# Patient Record
Sex: Female | Born: 1980 | Race: White | Hispanic: No | Marital: Single | Smoking: Never smoker
Health system: Southern US, Community
[De-identification: ages and names within clinical notes are randomized; demographics above are authoritative.]

## PROBLEM LIST (undated history)

## (undated) DIAGNOSIS — F319 Bipolar disorder, unspecified: Secondary | ICD-10-CM

## (undated) DIAGNOSIS — F53 Postpartum depression: Secondary | ICD-10-CM

## (undated) DIAGNOSIS — L732 Hidradenitis suppurativa: Secondary | ICD-10-CM

## (undated) DIAGNOSIS — R011 Cardiac murmur, unspecified: Secondary | ICD-10-CM

## (undated) DIAGNOSIS — F419 Anxiety disorder, unspecified: Secondary | ICD-10-CM

## (undated) DIAGNOSIS — R87619 Unspecified abnormal cytological findings in specimens from cervix uteri: Secondary | ICD-10-CM

## (undated) DIAGNOSIS — M1990 Unspecified osteoarthritis, unspecified site: Secondary | ICD-10-CM

## (undated) DIAGNOSIS — G4700 Insomnia, unspecified: Secondary | ICD-10-CM

## (undated) DIAGNOSIS — R51 Headache: Secondary | ICD-10-CM

## (undated) DIAGNOSIS — F32A Depression, unspecified: Secondary | ICD-10-CM

## (undated) DIAGNOSIS — G4730 Sleep apnea, unspecified: Secondary | ICD-10-CM

## (undated) DIAGNOSIS — G5600 Carpal tunnel syndrome, unspecified upper limb: Secondary | ICD-10-CM

## (undated) DIAGNOSIS — O219 Vomiting of pregnancy, unspecified: Secondary | ICD-10-CM

## (undated) DIAGNOSIS — F3132 Bipolar disorder, current episode depressed, moderate: Secondary | ICD-10-CM

## (undated) DIAGNOSIS — F329 Major depressive disorder, single episode, unspecified: Secondary | ICD-10-CM

## (undated) DIAGNOSIS — O99345 Other mental disorders complicating the puerperium: Secondary | ICD-10-CM

## (undated) DIAGNOSIS — D649 Anemia, unspecified: Secondary | ICD-10-CM

## (undated) DIAGNOSIS — J45909 Unspecified asthma, uncomplicated: Secondary | ICD-10-CM

## (undated) DIAGNOSIS — IMO0002 Reserved for concepts with insufficient information to code with codable children: Secondary | ICD-10-CM

## (undated) HISTORY — DX: Sleep apnea, unspecified: G47.30

## (undated) HISTORY — DX: Bipolar disorder, current episode depressed, moderate: F31.32

## (undated) HISTORY — DX: Unspecified osteoarthritis, unspecified site: M19.90

## (undated) HISTORY — DX: Anemia, unspecified: D64.9

## (undated) HISTORY — DX: Hidradenitis suppurativa: L73.2

## (undated) HISTORY — DX: Depression, unspecified: F32.A

## (undated) HISTORY — DX: Unspecified asthma, uncomplicated: J45.909

## (undated) HISTORY — DX: Anxiety disorder, unspecified: F41.9

## (undated) HISTORY — PX: CHOLECYSTECTOMY: SHX55

## (undated) HISTORY — DX: Cardiac murmur, unspecified: R01.1

## (undated) HISTORY — DX: Major depressive disorder, single episode, unspecified: F32.9

## (undated) HISTORY — DX: Vomiting of pregnancy, unspecified: O21.9

## (undated) HISTORY — DX: Bipolar disorder, unspecified: F31.9

## (undated) HISTORY — DX: Headache: R51

---

## 2002-09-24 ENCOUNTER — Emergency Department (HOSPITAL_COMMUNITY): Admission: EM | Admit: 2002-09-24 | Discharge: 2002-09-24 | Payer: Self-pay | Admitting: Emergency Medicine

## 2002-11-26 ENCOUNTER — Ambulatory Visit (HOSPITAL_COMMUNITY): Admission: AD | Admit: 2002-11-26 | Discharge: 2002-11-26 | Payer: Self-pay | Admitting: Obstetrics and Gynecology

## 2002-11-27 ENCOUNTER — Encounter: Payer: Self-pay | Admitting: Emergency Medicine

## 2002-11-27 ENCOUNTER — Inpatient Hospital Stay (HOSPITAL_COMMUNITY): Admission: EM | Admit: 2002-11-27 | Discharge: 2002-11-29 | Payer: Self-pay | Admitting: Emergency Medicine

## 2003-02-19 ENCOUNTER — Inpatient Hospital Stay (HOSPITAL_COMMUNITY): Admission: AD | Admit: 2003-02-19 | Discharge: 2003-02-22 | Payer: Self-pay | Admitting: Obstetrics and Gynecology

## 2003-09-12 ENCOUNTER — Encounter: Payer: Self-pay | Admitting: Orthopaedic Surgery

## 2003-09-19 ENCOUNTER — Ambulatory Visit (HOSPITAL_COMMUNITY): Admission: RE | Admit: 2003-09-19 | Discharge: 2003-09-19 | Payer: Self-pay | Admitting: Unknown Physician Specialty

## 2005-07-07 ENCOUNTER — Emergency Department (HOSPITAL_COMMUNITY): Admission: EM | Admit: 2005-07-07 | Discharge: 2005-07-07 | Payer: Self-pay | Admitting: Emergency Medicine

## 2006-08-09 ENCOUNTER — Ambulatory Visit (HOSPITAL_COMMUNITY): Admission: RE | Admit: 2006-08-09 | Discharge: 2006-08-09 | Payer: Self-pay | Admitting: Obstetrics & Gynecology

## 2006-10-01 ENCOUNTER — Ambulatory Visit (HOSPITAL_COMMUNITY): Admission: RE | Admit: 2006-10-01 | Discharge: 2006-10-01 | Payer: Self-pay | Admitting: Neurology

## 2006-10-27 ENCOUNTER — Ambulatory Visit (HOSPITAL_COMMUNITY): Admission: RE | Admit: 2006-10-27 | Discharge: 2006-10-27 | Payer: Self-pay | Admitting: Chiropractic Medicine

## 2006-12-07 ENCOUNTER — Ambulatory Visit (HOSPITAL_COMMUNITY): Admission: RE | Admit: 2006-12-07 | Discharge: 2006-12-07 | Payer: Self-pay | Admitting: Neurology

## 2007-07-14 HISTORY — PX: BRAIN SURGERY: SHX531

## 2007-08-18 ENCOUNTER — Other Ambulatory Visit: Admission: RE | Admit: 2007-08-18 | Discharge: 2007-08-18 | Payer: Self-pay | Admitting: Obstetrics and Gynecology

## 2008-09-06 ENCOUNTER — Other Ambulatory Visit: Admission: RE | Admit: 2008-09-06 | Discharge: 2008-09-06 | Payer: Self-pay | Admitting: Obstetrics and Gynecology

## 2009-02-14 ENCOUNTER — Ambulatory Visit (HOSPITAL_COMMUNITY): Admission: RE | Admit: 2009-02-14 | Discharge: 2009-02-14 | Payer: Self-pay | Admitting: Pediatrics

## 2009-04-30 ENCOUNTER — Encounter (HOSPITAL_COMMUNITY): Admission: RE | Admit: 2009-04-30 | Discharge: 2009-05-30 | Payer: Self-pay | Admitting: Orthopaedic Surgery

## 2009-07-25 ENCOUNTER — Ambulatory Visit (HOSPITAL_COMMUNITY): Admission: RE | Admit: 2009-07-25 | Discharge: 2009-07-25 | Payer: Self-pay | Admitting: Pediatrics

## 2009-12-27 ENCOUNTER — Ambulatory Visit (HOSPITAL_COMMUNITY): Admission: RE | Admit: 2009-12-27 | Discharge: 2009-12-27 | Payer: Self-pay | Admitting: Orthopaedic Surgery

## 2010-01-22 ENCOUNTER — Other Ambulatory Visit: Admission: RE | Admit: 2010-01-22 | Discharge: 2010-01-22 | Payer: Self-pay | Admitting: Obstetrics & Gynecology

## 2010-03-05 ENCOUNTER — Ambulatory Visit (HOSPITAL_COMMUNITY): Admission: RE | Admit: 2010-03-05 | Discharge: 2010-03-05 | Payer: Self-pay | Admitting: Obstetrics & Gynecology

## 2010-07-09 ENCOUNTER — Inpatient Hospital Stay (HOSPITAL_COMMUNITY)
Admission: AD | Admit: 2010-07-09 | Discharge: 2010-07-09 | Payer: Self-pay | Source: Home / Self Care | Attending: Obstetrics & Gynecology | Admitting: Obstetrics & Gynecology

## 2010-08-01 ENCOUNTER — Inpatient Hospital Stay (HOSPITAL_COMMUNITY)
Admission: AD | Admit: 2010-08-01 | Discharge: 2010-08-03 | Payer: Self-pay | Source: Home / Self Care | Attending: Obstetrics & Gynecology | Admitting: Obstetrics & Gynecology

## 2010-08-03 ENCOUNTER — Encounter: Payer: Self-pay | Admitting: Neurology

## 2010-08-04 LAB — CBC
MCH: 30.7 pg (ref 26.0–34.0)
MCHC: 32.6 g/dL (ref 30.0–36.0)
Platelets: 157 10*3/uL (ref 150–400)

## 2010-09-22 LAB — BASIC METABOLIC PANEL
BUN: 8 mg/dL (ref 6–23)
Creatinine, Ser: 0.49 mg/dL (ref 0.4–1.2)
GFR calc Af Amer: >60 mL/min (ref >60)
GFR calc non Af Amer: >60 mL/min (ref >60)
Glucose, Bld: 87 mg/dL (ref 70–99)
Potassium: 3.9 mEq/L (ref 3.5–5.1)

## 2010-09-22 LAB — RAPID URINE DRUG SCREEN, HOSP PERFORMED
Amphetamines: NOT DETECTED
Opiates: NOT DETECTED

## 2010-09-22 LAB — URINALYSIS, ROUTINE W REFLEX MICROSCOPIC
Protein, ur: NEGATIVE mg/dL
Specific Gravity, Urine: 1.02 (ref 1.005–1.030)
Urobilinogen, UA: 0.2 mg/dL (ref 0.0–1.0)
pH: 7.5 (ref 5.0–8.0)

## 2010-09-22 LAB — URINE MICROSCOPIC-ADD ON

## 2010-09-22 LAB — CBC
HCT: 35.5 % — ABNORMAL LOW (ref 36.0–46.0)
MCV: 95.4 fL (ref 78.0–100.0)
Platelets: 217 10*3/uL (ref 150–400)
RDW: 15.5 % (ref 11.5–15.5)
WBC: 12.7 10*3/uL — ABNORMAL HIGH (ref 4.0–10.5)

## 2010-11-28 NOTE — Op Note (Signed)
   NAME:  EUDELIA, HILTUNEN A                         ACCOUNT NO.:  [id number]   MEDICAL RECORD NO.:  [id number]                   PATIENT TYPE:  INP   LOCATION:  LDR4                                 FACILITY:  [facility]   PHYSICIAN:  Tilda Burrow, M.D.              DATE OF BIRTH:  Apr 22, 1981   DATE OF PROCEDURE:  DATE OF DISCHARGE:                                 OPERATIVE REPORT   LABOR COURSE:  Rheta was noted to be fully dilated and felt an urge to  push at 7 a.m.  After a one-hour and fifteen minute second stage, she  delivered a viable female infant at 34.  The mouth and nose were suctioned  with a bulb syringe on the perineum, and a nuchal cord was reduced without  difficulty.  The rest of the baby delivered without difficulty.  Weight is 6  pounds 15 ounces, Apgars are 8 and 9.  Pitocin, 20 units diluted in 1000 cc  of lactated Ringer's, was then infused rapidly IV.  The placenta separated  spontaneously and delivered via controlled cord traction and maternal  pushing effort at 0820.  It was inspected and appears to be intact with a  three-vessel cord.  The fundus was firm, and minimal blood loss was noted.  The epidural catheter was then removed, with the blue tip visualized as  being intact.  Prior to the removal of the epidural, the vagina was  inspected and only small first-degree lacerations were found, not  necessitating repair.  Estimated blood loss was 250 cc.     Jacklyn Shell, C.N.M.          Tilda Burrow, M.D.    FC/MEDQ  D:  02/20/2003  T:  02/20/2003  Job:  [id number]   cc:   [facility]

## 2010-11-28 NOTE — Discharge Summary (Signed)
NAMEVERLINDA, SLOTNICK                         ACCOUNT NO.:  [id number]   MEDICAL RECORD NO.:  [id number]                   PATIENT TYPE:  OIB   LOCATION:  A415                                 FACILITY:  [facility]   PHYSICIAN:  Tilda Burrow, M.D.              DATE OF BIRTH:  1980-08-11   DATE OF ADMISSION:  11/26/2002  DATE OF DISCHARGE:  11/26/2002                                 DISCHARGE SUMMARY   ADMISSION DIAGNOSIS:  Substernal chest pain since 6 p.m.   HISTORY OF PRESENT ILLNESS:  The patient is a 30 year old female, gravida 1,  para 0, at 27-4/[redacted] weeks gestation presents after complaining of severe  epigastric lower chest pain with associated shortness of breath and  discomfort upon breathing.  She has had similar pain, not quite as severe,  off and on for the past 2-1/2 months, and has been treated with Prevacid and  Nexium without significant improvement.  Tonight was worse, and was  associated with vomiting.  There was no blood or hematemesis.  The patient  presents in an urgent status complaining of chest pain.   PHYSICAL EXAMINATION:  VITAL SIGNS:  Temperature 98, pulse 108, respirations  40 initially, decreasing promptly with calming down, blood pressure was  123/70.  Urinalysis negative.  O2 saturation is now 100% on 2.5 L per nasal  cannula.  GENERAL:  Unlabored breathing, somber, somewhat __________ hippie-appearing  Caucasian female, alert and oriented x3.  Respiratory are regular and  unlabored.  ABDOMEN:  Shows a gravid uterus and obese female.  Pain is described by the  patient as epigastric, slightly on to the right of the midline and  penetrating through to the back on the right side at the level of the  inferior edge of the scapula.  She has a positive Murphy's sign.   GI cocktail was administered with only slight relief.  IV analgesics  administered.   IMPRESSION:  1. Biliary colic, rule out gallstones.  2. Intrauterine pregnancy, stable at present.   PLAN:  The patient is scheduled for a gallbladder ultrasound tomorrow at 2  p.m., and is given a Tylox prescription as well.   The patient is somewhat concerned giving history of amphetamines on her  initial urine screen.  This was performed the first week of her pregnancy  care, and the patient acknowledges using dietary stimulants as close as one  week to her first prenatal visit.  This likely explains the positive  amphetamine from the initial urine drug screen in the office.                                               Tilda Burrow, M.D.    JVF/MEDQ  D:  11/26/2002  T:  11/27/2002  Job:  [id number]

## 2010-11-28 NOTE — H&P (Signed)
   NAME:  Tina Ross, Tina Ross                         ACCOUNT NO.:  [id number]   MEDICAL RECORD NO.:  [id number]                   PATIENT TYPE:  INP   LOCATION:  LDR4                                 FACILITY:  [facility]   PHYSICIAN:  Tilda Burrow, M.D.              DATE OF BIRTH:  10/07/1980   DATE OF ADMISSION:  02/19/2003  DATE OF DISCHARGE:                                HISTORY & PHYSICAL   ADMISSION DIAGNOSIS:  Pregnancy 40 weeks latent phase labor, spontaneous  rupture of membranes with minimal labor.   HISTORY OF PRESENT ILLNESS:  This 30 year old female, gravida 1, para 0, AB  0, LMP May 13, 2002, placing the menstrual ________ February 18, 2003 with  ultrasound [facility] of August 14 and August 9 based on first and second trimester  scans. She was admitted after a pregnancy course followed through our office  since nine weeks gestation with a weight gain of 35 pounds reaching 258  pounds after last prenatal visit. The patient presented to [facility] at approximately just before midnight complaining of gush of fluid  at 8 p.m. There was no bleeding. Contractions were minimally appreciable.  The patient is accompanied by multiple family members, mother, father and  three others. The patient is admitted for labor management.   PAST MEDICAL HISTORY:  Positive for scoliosis.   PAST SURGICAL HISTORY:  Laparoscopic cholecystectomy during pregnancy, exact  date not identifiable on prenatal records.   ALLERGIES:  None.   SOCIAL HISTORY:  Single, lives with mother and the baby's father is  estranged having moved to [place]. She plans circumcision, plans to bottle  feed.   PHYSICAL EXAMINATION:  VITAL SIGNS:  Height 5 feet 4, weight 258.  GENERAL:  Shows a large frame Caucasian female with generous lax tissues in  the back area.  VITAL SIGNS:  Temperature 98.7, pulse 91, respiratory rate 20, blood  pressure 143/90.  Urinalysis negative. Initial exam at 23:30 shows the cervix to  be long,  closed and high, unable to be accessed due to discomfort and technical  difficulties of delivery. Vertex presentation was confirmed in the office  earlier. The patient had progressed to 6 cm dilated by the time of my  confirmatory exam at 6 a.m.   PRENATAL LABORATORIES:  Blood type O+, UDS positive for amphetamines from  diet pills.  Hemoglobin 13, hematocrit 41.  Hepatitis, HIV, GC, Chlamydia,  RPR are all negative.   PLAN:  Expect vaginal delivery. The patient will likely progress promptly.                                              Tilda Burrow, M.D.   JVF/MEDQ  D:  02/20/2003  T:  02/20/2003  Job:  [id number]

## 2010-11-28 NOTE — Op Note (Signed)
   NAME:  Tina Ross, Tina Ross                         ACCOUNT NO.:  [id number]   MEDICAL RECORD NO.:  [id number]                   PATIENT TYPE:  INP   LOCATION:  LDR4                                 FACILITY:  [facility]   PHYSICIAN:  Tilda Burrow, M.D.              DATE OF BIRTH:  [birth year]   DATE OF PROCEDURE:  DATE OF DISCHARGE:                                 OPERATIVE REPORT   PROCEDURE:  Epidural catheter placement.   TIME:  6:45 a.m.   DESCRIPTION OF PROCEDURE:  Continuous lumbar epidural catheter is placed at  the L2-3 interspace without any obvious complications using loss of  resistance technique. After consents obtained, the patient placed in a  sitting position, flexing forward, the back was prepped and draped, and L3-4  interspace anesthetized at the skin surface. The patient's very soft fatty  tissue over the back made for technically challenging identification of the  interspaces. L3-4 interspace effort was attempted but only bony obstruction  could be encountered at a depth of approximately 6 cm. We were unable to  identify access to the epidural space. This site was discontinued, L2-3  interspace attempted and initially were unsuccessfully moved up about a 1/4  of an inch and were able to obtain access through loss of resistance  technique at a depth of about 8 cm to what felt like the interspace.  Aspiration revealed no cerebrospinal fluid. The epidural catheter threaded  easily to a depth of 3 cm, and the catheter needle was withdrawn. The  initial test dose of 5 mL, 1.5% Xylocaine with epinephrine was then  infiltrated and again aspirated with no evidence of blood of excess fluid  aspiration. We then gave a 3 mL bolus (2 mL were used to flush the catheter,  then place at 12 mL/hour). The patient seemed more comfortable but was  obscure in her comments but obviously much more comfortable. Post catheter  exam showed her to be 8 cm dilated. The patient is tolerating  labor much  better at this time with symmetric analgesia.                                               Tilda Burrow, M.D.    JVF/MEDQ  D:  02/20/2003  T:  02/20/2003  Job:  [id number]

## 2010-11-28 NOTE — Discharge Summary (Signed)
   Tina Ross, Tina Ross                         ACCOUNT NO.:  [id number]   MEDICAL RECORD NO.:  [id number]                   PATIENT TYPE:  INP   LOCATION:  A427                                 FACILITY:  [facility]   PHYSICIAN:  Dalia Heading, M.D.               DATE OF BIRTH:  April 07, 1981   DATE OF ADMISSION:  11/27/2002  DATE OF DISCHARGE:  11/29/2002                                 DISCHARGE SUMMARY   AGE:  30 years old.   HOSPITAL COURSE SUMMARY:  Patient is a 30 year old white female who is [redacted]  weeks gestation who presented to the emergency room with right upper  quadrant abdominal pain and nausea.  Ultrasound of the gallbladder revealed  cholelithiasis.  Her liver enzyme tests were noted to be elevated.  She was  admitted to the hospital for intravenous hydration and further pain  management.  It was felt that even though she was at the beginning of her  third trimester, she would not do well with conservative management of her  cholelithiasis.  After extensive discussions with Dr. Emelda Fear and the  family and patient, it was elected to proceed with a laparoscopic  cholecystectomy; this was performed on 11/28/2002.  She tolerated the  procedure well.  Her postoperative course was unremarkable.  No evidence of  uterine contractions was noted.  Good fetal movement and heart tones were  noted postoperatively.   The patient is being discharged home on 11/29/2002 in good, improving  condition.  Of note was the fact that her liver enzyme tests were decreasing  towards normal.  Her hematocrit was stable as compared to preop.   DISCHARGE INSTRUCTIONS:   FOLLOW UP:  Patient is to follow up with Dr. Emelda Fear and Dr. Lovell Sheehan on  12/07/2002.   DISCHARGE MEDICATIONS:  Include Darvocet-N 100 one to two tablets p.o. q.4h.  p.r.n. pain.    PRINCIPAL DIAGNOSES:  1. Cholecystitis, cholelithiasis.  2. Intrauterine pregnancy.   PRINCIPAL PROCEDURE:  Laparoscopic cholecystectomy on  11/28/2002.                                               Dalia Heading, M.D.    MAJ/MEDQ  D:  11/29/2002  T:  11/29/2002  Job:  [id number]   cc:   Tilda Burrow, M.D.  [address] [place]  [place] [address]  Fax: [phone]   Patrica Duel, M.D.  [address]  [place]  [place] [address]  Fax: [phone]

## 2010-11-28 NOTE — Op Note (Signed)
NAME:  Tina Ross, Tina Ross                         ACCOUNT NO.:  [id number]   MEDICAL RECORD NO.:  [id number]                   PATIENT TYPE:  INP   LOCATION:  A427                                 FACILITY:  [facility]   PHYSICIAN:  Dalia Heading, M.D.               DATE OF BIRTH:  10/25/1980   DATE OF PROCEDURE:  11/28/2002  DATE OF DISCHARGE:                                 OPERATIVE REPORT   PREOPERATIVE DIAGNOSES:  Cholecystitis, cholelithiasis, intrauterine  pregnancy.   POSTOPERATIVE DIAGNOSES:  Cholecystitis, cholelithiasis, intrauterine  pregnancy.   PROCEDURE:  Laparoscopic cholecystectomy.   SURGEON:  Dalia Heading, M.D.   ASSISTANT:  Tilda Burrow, M.D.   ANESTHESIA:  General endotracheal.   INDICATIONS FOR PROCEDURE:  The patient is a 30 year old white female who is  [redacted] weeks pregnant who presents with biliary colic secondary to  cholelithiasis. She does have elevated liver enzyme test. Ultrasound of the  gallbladder reveals cholelithiasis. The risks and benefits of the procedure  including bleeding, infection, pain, hepatobiliary injury, the possibility  of the an open procedure, the possibility of premature labor and fetal  demise were fully explained to the patient, who gave informed consent.   DESCRIPTION OF PROCEDURE:  The patient was placed in the supine position.  After induction of the general endotracheal anesthesia, the abdomen was  prepped and draped using the usual sterile technique with Betadine. Surgical  site confirmation was performed. The uterus was palpated just above the  umbilicus. An supraumbilical incision was made down to the fascia and a  Veress needle was carefully introduced into the abdominal cavity without  difficulty. Confirmation placement was done using the saline drop test. The  abdomen was then insufflated to 16 mmHg pressure. An 11 mm trocar was  introduced into the abdominal cavity under direct visualization without  difficulty.  The uterus was inspected and there was no evidence of injury to  the uterus. The patient was placed in deeper reverse Trendelenburg position  and an additional 11 mm trocar was placed in the epigastric region and 5 mm  trocars placed in the right upper quadrant and right flank regions. The  liver was inspected and noted to be within normal limits. The gallbladder  was retracted superior and laterally. The gallbladder was noted to be tense.  Needle decompression was performed. The cystic duct was first identified.  Its juncture to the infundibulum fully identified. The Endoclips were placed  proximally and distally on the cystic duct and the cystic duct was divided.  This was likewise done at the cystic artery. The gallbladder was then freed  away from the gallbladder fossa using Bovie electrocautery. The gallbladder  was delivered through the epigastric trocar site using an EndoCatch bag  without difficulty. The gallbladder fossa was inspected and no abdominal  bleeding or bile leakage was noted. Surgicel was placed in the gallbladder  fossa. The uterus was  again inspected and appeared to be within normal  limits. All fluid and air were then evacuated from the abdominal cavity  prior to removal of the trocars.   All wounds were irrigated with normal saline. All wounds were injected with  0.5% Sensorcaine. The supraumbilical fascia was reapproximated using an #0  Vicryl interrupted suture. All skin incisions were closed using staples.  Betadine ointment and dry sterile dressings were applied.   All tape and needle counts were correct at the end of the procedure. The  patient was extubated in the operating room and went back to the recovery  room awake in stable condition. Fetal heart tones were noted in the recovery  room. There was no evidence of uterine contractions in the recovery room.   COMPLICATIONS:  None.   SPECIMENS:  Gallbladder with stones.   ESTIMATED BLOOD LOSS:   Minimal.                                               Dalia Heading, M.D.    MAJ/MEDQ  D:  11/28/2002  T:  11/28/2002  Job:  [id number]   cc:   Tilda Burrow, M.D.  [address] [place]  [place] [address]  Fax: [phone]   Patrica Duel, M.D.  [address], [address]  [place]  [place] [address]  Fax: [phone]

## 2010-11-28 NOTE — H&P (Signed)
NAME:  Tina Ross, Tina Ross                         ACCOUNT NO.:  [id number]   MEDICAL RECORD NO.:  [id number]                   PATIENT TYPE:  INP   LOCATION:  A427                                 FACILITY:  [facility]   PHYSICIAN:  Dalia Heading, M.D.               DATE OF BIRTH:  1981/03/04   DATE OF ADMISSION:  11/27/2002  DATE OF DISCHARGE:                                HISTORY & PHYSICAL   REASON FOR ADMISSION:  Biliary colic, cholelithiasis, 28-weeks intrauterine  pregnancy.   HISTORY OF PRESENT ILLNESS:  The patient is a 30 year old white female who  is 28-weeks pregnant with a two-month history of treatment for GERD who now  presents with a 24-hour history of worsening right upper quadrant abdominal  pain. She states it hurts to take a deep breath in. It seems to have made  her epigastric pain worse. She was brought into the hospital yesterday  evening for intravenous hydration. She returns this morning with worsening  right upper quadrant abdominal pain. Ultrasound of the gallbladder reveals  cholelithiasis. She states that the morphine has helped her pain, though it  is still difficult to take a deep breath in.   PAST MEDICAL HISTORY:  As noted above.   PAST SURGICAL HISTORY:  Unremarkable.   CURRENT MEDICATIONS:  Phenergan and Pepcid.   ALLERGIES:  No known drug allergies.   REVIEW OF SYSTEMS:  Noncontributory.   PHYSICAL EXAMINATION:  GENERAL:  The patient is a 30 year old white female  who is in no acute distress. She is afebrile. Vital signs are stable.  LUNGS:  Clear to auscultation with equal breath sounds bilaterally.  HEART:  Reveals a regular rate and rhythm without S3, S4, or murmurs.  ABDOMEN:  Soft with a 28-week gestation fundus present. She is tender in the  right upper quadrant to palpation. No hepatosplenomegaly or masses noted.   MET 7 is within normal limits. Liver profile is pending. White blood cell  count is 11.5, hematocrit 32, platelet count 251.  Ultrasound of the  gallbladder reveals cholelithiasis with a normal common bile duct. Fetal  heart rate of 142 is noted.   IMPRESSION:  1. Biliary colic.  2. Cholelithiasis.  3. Twenty-eight weeks intrauterine pregnancy.   PLAN:  Given the patient's significant right upper quadrant abdominal pain  despite having morphine, it is felt by myself and Dr. Emelda Fear that she will  not tolerate 10 weeks of further gestation with cholelithiasis present. It  is recommended that the patient undergo a laparoscopic cholecystectomy. The  risks and benefits of the procedure including bleeding, infection,  hepatobiliary injury, the possibility of open procedure, and the possibility  of causing preterm labor were fully explained to the patient and mother, who  gave informed consent. The patient will be admitted to the hospital for  intravenous hydration and bowel preparation.  Dalia Heading, M.D.    MAJ/MEDQ  D:  11/27/2002  T:  11/27/2002  Job:  [id number]   cc:   Tilda Burrow, M.D.  [address] [place]  [place] [address]  Fax: [phone]

## 2011-01-13 ENCOUNTER — Ambulatory Visit (HOSPITAL_COMMUNITY)
Admission: RE | Admit: 2011-01-13 | Discharge: 2011-01-13 | Disposition: A | Discharge status: Home / Self Care | Payer: Medicaid Other | Source: Ambulatory Visit | Attending: Pediatrics | Admitting: Pediatrics

## 2011-01-13 ENCOUNTER — Other Ambulatory Visit (HOSPITAL_COMMUNITY): Payer: Self-pay | Admitting: Pediatrics

## 2011-01-13 DIAGNOSIS — M545 Low back pain, unspecified: Secondary | ICD-10-CM | POA: Insufficient documentation

## 2012-01-07 ENCOUNTER — Ambulatory Visit (INDEPENDENT_AMBULATORY_CARE_PROVIDER_SITE_OTHER): Payer: Medicaid Other | Admitting: Otolaryngology

## 2012-07-08 ENCOUNTER — Emergency Department (HOSPITAL_COMMUNITY)
Admission: EM | Admit: 2012-07-08 | Discharge: 2012-07-08 | Disposition: A | Discharge status: Home / Self Care | Payer: Medicaid Other | Attending: Emergency Medicine | Admitting: Emergency Medicine

## 2012-07-08 ENCOUNTER — Emergency Department (HOSPITAL_COMMUNITY): Payer: Medicaid Other

## 2012-07-08 ENCOUNTER — Encounter (HOSPITAL_COMMUNITY): Payer: Self-pay | Admitting: *Deleted

## 2012-07-08 DIAGNOSIS — X58XXXA Exposure to other specified factors, initial encounter: Secondary | ICD-10-CM | POA: Insufficient documentation

## 2012-07-08 DIAGNOSIS — Z8659 Personal history of other mental and behavioral disorders: Secondary | ICD-10-CM | POA: Insufficient documentation

## 2012-07-08 DIAGNOSIS — S63601A Unspecified sprain of right thumb, initial encounter: Secondary | ICD-10-CM

## 2012-07-08 DIAGNOSIS — S6390XA Sprain of unspecified part of unspecified wrist and hand, initial encounter: Secondary | ICD-10-CM | POA: Insufficient documentation

## 2012-07-08 DIAGNOSIS — Z8739 Personal history of other diseases of the musculoskeletal system and connective tissue: Secondary | ICD-10-CM | POA: Insufficient documentation

## 2012-07-08 DIAGNOSIS — Y9372 Activity, wrestling: Secondary | ICD-10-CM | POA: Insufficient documentation

## 2012-07-08 DIAGNOSIS — Y9289 Other specified places as the place of occurrence of the external cause: Secondary | ICD-10-CM | POA: Insufficient documentation

## 2012-07-08 HISTORY — DX: Carpal tunnel syndrome, unspecified upper limb: G56.00

## 2012-07-08 HISTORY — DX: Bipolar disorder, unspecified: F31.9

## 2012-07-08 NOTE — ED Notes (Signed)
Pain to right thumb, radiating up wrist and forearm. Pt states "bone grinding on bone" pain to thumb. NAD.

## 2012-07-08 NOTE — ED Provider Notes (Signed)
History     CSN: [id number]  Arrival date & time 07/08/12  1608   First MD Initiated Contact with Patient 07/08/12 1826      Chief Complaint  Patient presents with  . Hand Pain    (Consider location/radiation/quality/duration/timing/severity/associated sxs/prior treatment) HPI Comments: Horse playing with husband and injured R thumb.  R hand dominant.  Patient is a 31 y.o. female presenting with hand pain. The history is provided by the patient. No language interpreter was used.  Hand Pain This is a new problem. Episode onset: 4 days ago. The problem occurs constantly. The problem has been unchanged. Pertinent negatives include no chills or fever. Nothing aggravates the symptoms. She has tried nothing for the symptoms.    Past Medical History  Diagnosis Date  . Bipolar 1 disorder   . Carpal tunnel syndrome     Past Surgical History  Procedure Date  . Cholecystectomy   . Brain surgery     No family history on file.  History  Substance Use Topics  . Smoking status: Never Smoker   . Smokeless tobacco: Not on file  . Alcohol Use: Yes     Comment: Occ    OB History    Grav Para Term Preterm Abortions TAB SAB Ect Mult Living                  Review of Systems  Constitutional: Negative for fever and chills.  Musculoskeletal:       Thumb injury   Skin: Negative for wound.  All other systems reviewed and are negative.    Allergies  Review of patient's allergies indicates no known allergies.  Home Medications   Current Outpatient Rx  Name  Route  Sig  Dispense  Refill  . FERROUS SULFATE 325 (65 FE) MG PO TABS   Oral   Take 325 mg by mouth daily.           BP 145/101  Pulse 94  Temp 98.1 F (36.7 C) (Oral)  Resp 20  Ht 5\' 4"  (1.626 m)  Wt 240 lb (108.863 kg)  BMI 41.20 kg/m2  SpO2 98%  LMP 07/01/2012  Physical Exam  Nursing note and vitals reviewed. Constitutional: She is oriented to person, place, and time. She appears well-developed and  well-nourished. No distress.  HENT:  Head: Normocephalic and atraumatic.  Eyes: EOM are normal.  Neck: Normal range of motion.  Cardiovascular: Normal rate, regular rhythm and normal heart sounds.   Pulmonary/Chest: Effort normal and breath sounds normal.  Abdominal: Soft. She exhibits no distension. There is no tenderness.  Musculoskeletal: She exhibits tenderness.       Right hand: She exhibits decreased range of motion, tenderness and bony tenderness. She exhibits no deformity, no laceration and no swelling. normal sensation noted. Normal strength noted.       Hands: Neurological: She is alert and oriented to person, place, and time.  Skin: Skin is warm and dry.  Psychiatric: She has a normal mood and affect. Judgment normal.    ED Course  Procedures (including critical care time)  Labs Reviewed - No data to display Dg Hand Complete Right  07/08/2012  *RADIOLOGY REPORT*  Clinical Data: Hand pain  RIGHT HAND - COMPLETE 3+ VIEW  Comparison: None.  Findings: Radiocarpal joint is normal.  No carpal or metacarpal fracture.  No   phalangeal injury.  IMPRESSION: No acute osseous abnormality.   Original Report Authenticated By: Genevive Bi, M.D.      1.  Sprain of right thumb       MDM  Ice. Ibuprofen F/u with PCP prn        Tina Field, PA 07/08/12 1913

## 2012-07-08 NOTE — ED Notes (Signed)
Pain rt hand for 4 days, injury when "wrestling " with someone.  No visible injury

## 2012-07-09 NOTE — ED Provider Notes (Signed)
Medical screening examination/treatment/procedure(s) were performed by non-physician practitioner and as supervising physician I was immediately available for consultation/collaboration.  Dartanian Knaggs, MD 07/09/12 0003 

## 2012-07-13 NOTE — L&D Delivery Note (Signed)
Delivery Note At 6:35 PM a viable female was delivered via Vaginal, Spontaneous Delivery (Presentation: Right Occiput Anterior).  APGAR: 8, 9; weight: pending Placenta status: Intact, Spontaneous.  Cord: 3 vessels with the following complications: None.    Anesthesia: None  Episiotomy: None Lacerations: None Suture Repair: n/a Est. Blood Loss (mL): 150  Mother pushed twice to precipitously deliver viable female infant over intact perineum in MAU. Nuchal cord x 1 which was delivered through then reduced after delivery of body. Shoulders and body delivered without difficulty or delay. Baby vigorous and crying upon delivery and placed on maternal abdomen. Cord clamping delayed until umbilical cord stopped pulsing. Cord then clamped and cut by FOB.  Third stage of labor actively managed with gentle cord traction and Oxytocin. Placenta delivered intact with 3 vessel cord. No perineal or vaginal lacerations. Mother and infant stable at conclusion of delivery. Counts correct and verified. Mom to postpartum. Infant to couplet care/skin to skin.   Delivery performed by me with direct supervision by Caren Griffins, CNM  [place], Ryann 05/19/2013, 7:38 PM Evaluation and management procedures were performed by Resident physician under my supervision/collaboration. Chart reviewed, patient examined by me and I agree with management and plan.

## 2012-09-21 ENCOUNTER — Other Ambulatory Visit: Payer: Self-pay | Admitting: Obstetrics & Gynecology

## 2012-09-21 DIAGNOSIS — Z1389 Encounter for screening for other disorder: Secondary | ICD-10-CM

## 2012-09-27 ENCOUNTER — Telehealth: Payer: Self-pay | Admitting: *Deleted

## 2012-09-28 ENCOUNTER — Telehealth: Payer: Self-pay | Admitting: *Deleted

## 2012-09-28 NOTE — Telephone Encounter (Signed)
Pt called with complaining of cold symptoms. I advised her that she could take tylenol and use cough drops. Still too early for anything else. Pt also stated that she fell on the ice 2 days ago and hurt her back. She denied any bleeding or cramping at this time. I spoke with Dr. Emelda Fear about her fall and he said she should be fine. Pt advised of this. The pt also stated that she had stopped taking her bipolar meds and had been off of them for a few months. She stated she felt like she was going to go crazy. I asked the pt if she was having suicidal thoughts or thoughts of harming herself or someone else. She stated no to both of those. I tried to give her an appointment before next Monday and she refused. Stated she wanted to wait until Monday.

## 2012-10-03 ENCOUNTER — Ambulatory Visit (INDEPENDENT_AMBULATORY_CARE_PROVIDER_SITE_OTHER): Payer: Medicaid Other

## 2012-10-03 DIAGNOSIS — O26849 Uterine size-date discrepancy, unspecified trimester: Secondary | ICD-10-CM

## 2012-10-03 DIAGNOSIS — Z1389 Encounter for screening for other disorder: Secondary | ICD-10-CM

## 2012-10-03 NOTE — Progress Notes (Signed)
U/S-transvaginal u/s performed, single IUP with +FCA noted, FHR=131BPM, CRL c/w 6+3 wks, EDC-05-26-2013, cx long and closed, bilateral adnexa wnl

## 2012-10-04 ENCOUNTER — Other Ambulatory Visit (HOSPITAL_COMMUNITY)
Admission: RE | Admit: 2012-10-04 | Discharge: 2012-10-04 | Disposition: A | Discharge status: Home / Self Care | Payer: Medicaid Other | Source: Ambulatory Visit | Attending: Obstetrics and Gynecology | Admitting: Obstetrics and Gynecology

## 2012-10-04 ENCOUNTER — Other Ambulatory Visit: Payer: Self-pay | Admitting: Adult Health

## 2012-10-04 ENCOUNTER — Ambulatory Visit (INDEPENDENT_AMBULATORY_CARE_PROVIDER_SITE_OTHER): Payer: Medicaid Other | Admitting: Adult Health

## 2012-10-04 ENCOUNTER — Encounter: Payer: Self-pay | Admitting: Adult Health

## 2012-10-04 VITALS — BP 126/74 | Wt 274.0 lb

## 2012-10-04 DIAGNOSIS — Z113 Encounter for screening for infections with a predominantly sexual mode of transmission: Secondary | ICD-10-CM | POA: Insufficient documentation

## 2012-10-04 DIAGNOSIS — O09299 Supervision of pregnancy with other poor reproductive or obstetric history, unspecified trimester: Secondary | ICD-10-CM

## 2012-10-04 DIAGNOSIS — Z3480 Encounter for supervision of other normal pregnancy, unspecified trimester: Secondary | ICD-10-CM | POA: Insufficient documentation

## 2012-10-04 DIAGNOSIS — E669 Obesity, unspecified: Secondary | ICD-10-CM

## 2012-10-04 DIAGNOSIS — O210 Mild hyperemesis gravidarum: Secondary | ICD-10-CM

## 2012-10-04 DIAGNOSIS — O219 Vomiting of pregnancy, unspecified: Secondary | ICD-10-CM | POA: Insufficient documentation

## 2012-10-04 DIAGNOSIS — O99210 Obesity complicating pregnancy, unspecified trimester: Secondary | ICD-10-CM

## 2012-10-04 DIAGNOSIS — Z1151 Encounter for screening for human papillomavirus (HPV): Secondary | ICD-10-CM | POA: Insufficient documentation

## 2012-10-04 DIAGNOSIS — Z3490 Encounter for supervision of normal pregnancy, unspecified, unspecified trimester: Secondary | ICD-10-CM

## 2012-10-04 DIAGNOSIS — F3132 Bipolar disorder, current episode depressed, moderate: Secondary | ICD-10-CM

## 2012-10-04 DIAGNOSIS — Z331 Pregnant state, incidental: Secondary | ICD-10-CM

## 2012-10-04 DIAGNOSIS — O99340 Other mental disorders complicating pregnancy, unspecified trimester: Secondary | ICD-10-CM

## 2012-10-04 DIAGNOSIS — Z01419 Encounter for gynecological examination (general) (routine) without abnormal findings: Secondary | ICD-10-CM | POA: Insufficient documentation

## 2012-10-04 DIAGNOSIS — O9989 Other specified diseases and conditions complicating pregnancy, childbirth and the puerperium: Secondary | ICD-10-CM

## 2012-10-04 DIAGNOSIS — L732 Hidradenitis suppurativa: Secondary | ICD-10-CM | POA: Insufficient documentation

## 2012-10-04 DIAGNOSIS — Z1389 Encounter for screening for other disorder: Secondary | ICD-10-CM

## 2012-10-04 DIAGNOSIS — Z3481 Encounter for supervision of other normal pregnancy, first trimester: Secondary | ICD-10-CM

## 2012-10-04 HISTORY — DX: Vomiting of pregnancy, unspecified: O21.9

## 2012-10-04 HISTORY — DX: Bipolar disorder, current episode depressed, moderate: F31.32

## 2012-10-04 HISTORY — DX: Hidradenitis suppurativa: L73.2

## 2012-10-04 LAB — POCT URINALYSIS DIPSTICK
Blood, UA: NEGATIVE
Ketones, UA: NEGATIVE
Protein, UA: NEGATIVE

## 2012-10-04 MED ORDER — PROMETHAZINE HCL 25 MG PO TABS: 25.0000 mg | ORAL_TABLET | Freq: Four times a day (QID) | ORAL | Status: DC | PRN

## 2012-10-04 NOTE — Patient Instructions (Addendum)
Take phenergan 25mg  1 tab. Every 6 hours prn nausea, return in 4 weeks, look at ob packet for otc meds for cold sx. Call any problems. Sign up for my chart

## 2012-10-04 NOTE — Progress Notes (Signed)
Had +FHM on ultrasound yesterday. Had pelvic exam and pap today,External genitalia: Hidradenitis mons pubis and between thighs. Vagina normal in appearance, cervix bulbous with - CMT, uterus felt to be normal size shape and contour slightly difficult exam secondary to abdominal girth.No bleeding No discharge itching or burning, Complains of nausea and vomiting daily.Has occ. Panic attack, no bipolar meds. Since November.

## 2012-10-04 NOTE — Progress Notes (Signed)
Pt given [facility]NC form to fill out and consents signed for labs. Wants to discuss her anxiety attacks and some issues she is having.

## 2012-10-05 LAB — DRUG SCREEN, URINE, NO CONFIRMATION
Amphetamine Screen, Ur: NEGATIVE
Barbiturate Quant, Ur: NEGATIVE
Cocaine Metabolites: NEGATIVE

## 2012-10-05 LAB — RUBELLA SCREEN: Rubella: 0.85 Index (ref <0.90)

## 2012-10-05 LAB — URINALYSIS
Hgb urine dipstick: NEGATIVE
Leukocytes, UA: NEGATIVE
Nitrite: NEGATIVE
Protein, ur: NEGATIVE mg/dL
Urobilinogen, UA: 0.2 mg/dL (ref 0.0–1.0)

## 2012-10-05 LAB — ABO AND RH: Rh Type: POSITIVE

## 2012-10-05 LAB — HEPATITIS B SURFACE ANTIGEN: Hepatitis B Surface Ag: NEGATIVE

## 2012-10-05 LAB — US OB TRANSVAGINAL

## 2012-10-05 LAB — VARICELLA ZOSTER ANTIBODY, IGG: Varicella IgG: 303.5 Index — ABNORMAL HIGH (ref <135.00)

## 2012-10-05 LAB — CBC
MCHC: 33.3 g/dL (ref 30.0–36.0)
Platelets: 183 10*3/uL (ref 150–400)
RDW: 13.8 % (ref 11.5–15.5)

## 2012-10-05 LAB — HIV ANTIBODY (ROUTINE TESTING W REFLEX): HIV: NONREACTIVE

## 2012-10-13 ENCOUNTER — Telehealth: Payer: Self-pay | Admitting: *Deleted

## 2012-10-13 DIAGNOSIS — Z3490 Encounter for supervision of normal pregnancy, unspecified, unspecified trimester: Secondary | ICD-10-CM

## 2012-10-13 DIAGNOSIS — B955 Unspecified streptococcus as the cause of diseases classified elsewhere: Secondary | ICD-10-CM

## 2012-10-13 MED ORDER — AMPICILLIN 250 MG PO CAPS: 250.0000 mg | ORAL_CAPSULE | Freq: Three times a day (TID) | ORAL | Status: DC

## 2012-10-13 MED ORDER — PRENATAL MULTIVITAMIN CH: 1.0000 | ORAL_TABLET | Freq: Every day | ORAL | Status: DC

## 2012-10-13 NOTE — Telephone Encounter (Signed)
Pt. Lost prenatal vitamins, will call Prenatal plus into [facility].She is complaining of some back pain to the left. I informed her of the GBS in her urine and that I was calling ampicillin in for that.I also informed her of an abnormal pap smear and that she needed a colposcopy

## 2012-10-13 NOTE — Telephone Encounter (Signed)
Requesting PNV to [facility], pt stated lost samples that was given to her at last appt.

## 2012-11-02 ENCOUNTER — Encounter: Payer: Self-pay | Admitting: *Deleted

## 2012-11-03 ENCOUNTER — Encounter: Payer: Self-pay | Admitting: Advanced Practice Midwife

## 2012-11-03 ENCOUNTER — Ambulatory Visit (INDEPENDENT_AMBULATORY_CARE_PROVIDER_SITE_OTHER): Payer: Medicaid Other | Admitting: Advanced Practice Midwife

## 2012-11-03 VITALS — BP 118/64 | Wt 267.6 lb

## 2012-11-03 DIAGNOSIS — IMO0002 Reserved for concepts with insufficient information to code with codable children: Secondary | ICD-10-CM

## 2012-11-03 DIAGNOSIS — Z331 Pregnant state, incidental: Secondary | ICD-10-CM

## 2012-11-03 DIAGNOSIS — Z1389 Encounter for screening for other disorder: Secondary | ICD-10-CM

## 2012-11-03 DIAGNOSIS — O99340 Other mental disorders complicating pregnancy, unspecified trimester: Secondary | ICD-10-CM

## 2012-11-03 DIAGNOSIS — O09299 Supervision of pregnancy with other poor reproductive or obstetric history, unspecified trimester: Secondary | ICD-10-CM

## 2012-11-03 DIAGNOSIS — Z3481 Encounter for supervision of other normal pregnancy, first trimester: Secondary | ICD-10-CM

## 2012-11-03 DIAGNOSIS — F3132 Bipolar disorder, current episode depressed, moderate: Secondary | ICD-10-CM

## 2012-11-03 LAB — POCT URINALYSIS DIPSTICK
Blood, UA: NEGATIVE
Ketones, UA: NEGATIVE
Protein, UA: NEGATIVE

## 2012-11-03 NOTE — Progress Notes (Signed)
No c/o at this time.  Routine questions about pregnancy answered.  F/U in 2 weeks for NT/IT.  4 weeks for Colpo

## 2012-11-03 NOTE — Patient Instructions (Addendum)
Abnormal Pap Test Information  During a Pap test, the cells on the surface of your cervix are checked to see if they look normal, abnormal, or if they show signs of having been altered by a certain type of virus called human papillomavirus, or HPV. Cervical cells that have been affected by HPV are called dysplasia. Dysplasia is not cancer, but describes abnormal cells found on the surface of the cervix. Depending on the degree of dysplasia, some of the cells may be considered pre-cancerous and may turn into cancer over time if follow up with a caregiver is delayed.   WHAT DOES AN ABNORMAL PAP TEST MEAN?  Having an abnormal pap test does not mean that you have cancer. However, certain types of abnormal pap tests can be a sign that a person is at a higher risk of developing cancer. Your caregiver will want to do other tests to find out more about the abnormal cells. Your abnormal Pap test results could show:   · Small and uncertain changes that should be carefully watched.    · Cervical dysplasia that has caused mild changes and can be followed over time.    · Cervical dysplasia that is more severe and needs to be followed and treated to ensure the problem goes away.  · Cancer.    When severe cervical dysplasia is found and treated early, it rarely will grow into cancer.   WHAT WILL BE DONE ABOUT MY ABNORMAL PAP TEST?  · A colposcopy may be needed. This is a procedure where your cervix is examined using light and magnification.  · A small tissue sample of your cervix (biopsy) may need to be removed and then examined. This is often performed if there are areas that appear infected.  · A sample of cells from the cervical canal may be removed with either a small brush or scraping instrument (curette).  Based on the results of the procedures above, some caregivers may recommend either cryotherapy of the cervix or a surgical LEEP where a portion of the cervix is removed. LEEP is short for "loop electrical excisional  procedure." Rarely, a caregiver may recommend a cone biopsy. This is a procedure where a small, cone-shaped sample of your cervix is taken out. The part that is taken out is the area where the abnormal cells are.    WHAT IF I HAVE A DYSPLASIA OR A CANCER?  You may be referred to a specialist. Radiation may also be a treatment for more advanced cancer. Having a hysterectomy is the last treatment option for dysplasia, but it is a more common treatment for someone with cancer. All treatment options will be discussed with you by your caregiver.  WHAT SHOULD YOU DO AFTER BEING TREATED?  If you have had an abnormal pap test, you should continue to have regular pap tests and check-ups as directed by your caregiver. Your cervical problem will be carefully watched so it does not get worse. Also, your caregiver can watch for, and treat, any new problems that may come up.  Document Released: 10/14/2010 Document Revised: 09/21/2011 Document Reviewed: 06/25/2011  ExitCare® Patient Information ©2013 [employer].

## 2012-11-03 NOTE — Progress Notes (Signed)
Pain in left side; went to [facility] ER on Tuesday and was told they heard 2 heartbeats.

## 2012-11-03 NOTE — Assessment & Plan Note (Signed)
Not on any meds for Bipolar (was on Geodon 80 mg BID).  Feels OK

## 2012-11-15 ENCOUNTER — Telehealth: Payer: Self-pay | Admitting: *Deleted

## 2012-11-15 DIAGNOSIS — O219 Vomiting of pregnancy, unspecified: Secondary | ICD-10-CM

## 2012-11-15 DIAGNOSIS — L732 Hidradenitis suppurativa: Secondary | ICD-10-CM

## 2012-11-15 DIAGNOSIS — IMO0002 Reserved for concepts with insufficient information to code with codable children: Secondary | ICD-10-CM

## 2012-11-15 DIAGNOSIS — F3132 Bipolar disorder, current episode depressed, moderate: Secondary | ICD-10-CM

## 2012-11-15 NOTE — Telephone Encounter (Signed)
Pt called and stated that she was ready to pull her hair out.  She stated she is very stressed and overwhelmed. She stated that her daughter was living with her mother and would not come home, and she was getting very angry and upset about that. I advised her to keep her appointment on Thursday and discuss it with a provider then. Also discuss if meds are needed. Pt verbalized understanding.

## 2012-11-17 ENCOUNTER — Ambulatory Visit (INDEPENDENT_AMBULATORY_CARE_PROVIDER_SITE_OTHER): Payer: Medicaid Other

## 2012-11-17 ENCOUNTER — Ambulatory Visit (INDEPENDENT_AMBULATORY_CARE_PROVIDER_SITE_OTHER): Payer: Medicaid Other | Admitting: Obstetrics and Gynecology

## 2012-11-17 ENCOUNTER — Other Ambulatory Visit: Payer: Self-pay | Admitting: Obstetrics and Gynecology

## 2012-11-17 VITALS — BP 112/80 | Wt 266.6 lb

## 2012-11-17 DIAGNOSIS — O09299 Supervision of pregnancy with other poor reproductive or obstetric history, unspecified trimester: Secondary | ICD-10-CM

## 2012-11-17 DIAGNOSIS — Z3491 Encounter for supervision of normal pregnancy, unspecified, first trimester: Secondary | ICD-10-CM

## 2012-11-17 DIAGNOSIS — Z36 Encounter for antenatal screening of mother: Secondary | ICD-10-CM

## 2012-11-17 DIAGNOSIS — Z3481 Encounter for supervision of other normal pregnancy, first trimester: Secondary | ICD-10-CM

## 2012-11-17 DIAGNOSIS — O99340 Other mental disorders complicating pregnancy, unspecified trimester: Secondary | ICD-10-CM

## 2012-11-17 DIAGNOSIS — Z1389 Encounter for screening for other disorder: Secondary | ICD-10-CM

## 2012-11-17 DIAGNOSIS — Z331 Pregnant state, incidental: Secondary | ICD-10-CM

## 2012-11-17 NOTE — Patient Instructions (Signed)
vicks vaporub or neosporin to nose

## 2012-11-17 NOTE — Progress Notes (Signed)
U/S 12+6wks -single IUP with +FCA noted, cx long and closed, bilateral adnexa wnl, NB present , NT=1.72mm, CRL c/w dates

## 2012-11-17 NOTE — Progress Notes (Signed)
1. Nasal sores, will treat topically with neosporin 2. Pt's mother died at 87- liver cancer/[facility]/ PT relieved of mother's negative influence on her.

## 2012-11-18 LAB — POCT URINALYSIS DIPSTICK
Blood, UA: NEGATIVE
Glucose, UA: NEGATIVE
Ketones, UA: NEGATIVE

## 2012-12-01 ENCOUNTER — Encounter: Payer: Medicaid Other | Admitting: Obstetrics & Gynecology

## 2012-12-02 ENCOUNTER — Encounter: Payer: Medicaid Other | Admitting: Obstetrics & Gynecology

## 2012-12-15 ENCOUNTER — Ambulatory Visit (INDEPENDENT_AMBULATORY_CARE_PROVIDER_SITE_OTHER): Payer: Medicaid Other | Admitting: Obstetrics & Gynecology

## 2012-12-15 ENCOUNTER — Other Ambulatory Visit: Payer: Self-pay | Admitting: Obstetrics & Gynecology

## 2012-12-15 VITALS — BP 110/60 | Wt 266.0 lb

## 2012-12-15 DIAGNOSIS — Z1389 Encounter for screening for other disorder: Secondary | ICD-10-CM

## 2012-12-15 DIAGNOSIS — R87810 Cervical high risk human papillomavirus (HPV) DNA test positive: Secondary | ICD-10-CM

## 2012-12-15 DIAGNOSIS — R87610 Atypical squamous cells of undetermined significance on cytologic smear of cervix (ASC-US): Secondary | ICD-10-CM

## 2012-12-15 DIAGNOSIS — Z3482 Encounter for supervision of other normal pregnancy, second trimester: Secondary | ICD-10-CM

## 2012-12-15 DIAGNOSIS — O99891 Other specified diseases and conditions complicating pregnancy: Secondary | ICD-10-CM

## 2012-12-15 DIAGNOSIS — Z331 Pregnant state, incidental: Secondary | ICD-10-CM

## 2012-12-15 DIAGNOSIS — N870 Mild cervical dysplasia: Secondary | ICD-10-CM

## 2012-12-15 DIAGNOSIS — IMO0002 Reserved for concepts with insufficient information to code with codable children: Secondary | ICD-10-CM

## 2012-12-15 LAB — POCT URINALYSIS DIPSTICK
Glucose, UA: NEGATIVE
Ketones, UA: NEGATIVE
Leukocytes, UA: NEGATIVE

## 2012-12-15 NOTE — Progress Notes (Signed)
BP weight and urine results all reviewed and noted. Patient reports good fetal movement, denies any bleeding and no rupture of membranes symptoms or regular contractions. Patient is without complaints. All questions were answered.  

## 2012-12-15 NOTE — Progress Notes (Signed)
HAVING SOME VAGINAL DISCHARGE. PALE-LIKE COLOR.

## 2012-12-21 LAB — MATERNAL SCREEN, INTEGRATED #2
AFP MoM: 1.36
Crown Rump Length: 71.4 mm
Estriol Mom: 1.64
Estriol, Free: 0.95 ng/mL
MSS Down Syndrome: <1:5000 {titer}
Nuchal Translucency: 1.82 mm
Number of fetuses: 1
PAPP-A: 360 ng/mL

## 2012-12-22 ENCOUNTER — Telehealth: Payer: Self-pay | Admitting: Adult Health

## 2012-12-23 NOTE — Telephone Encounter (Signed)
Pt states the taste of food and drink is really bad. She has lost her appetite, had this problem since Monday.  Pt feels very depressed. Still eating and drinking but everything makes her gag. Phenergan helps but makes her sleepy.

## 2012-12-23 NOTE — Telephone Encounter (Signed)
Pt teary says she depressed and food just does not taste good, told her to go to the ER if felt suicidal but she could come see [place] Monday at 9 am and she said ok and I encouraged her to drink and eat small amounts

## 2012-12-26 ENCOUNTER — Ambulatory Visit (INDEPENDENT_AMBULATORY_CARE_PROVIDER_SITE_OTHER): Payer: Medicaid Other | Admitting: Obstetrics & Gynecology

## 2012-12-26 ENCOUNTER — Encounter: Payer: Self-pay | Admitting: Obstetrics & Gynecology

## 2012-12-26 VITALS — BP 100/60 | Wt 265.0 lb

## 2012-12-26 DIAGNOSIS — F418 Other specified anxiety disorders: Secondary | ICD-10-CM | POA: Insufficient documentation

## 2012-12-26 DIAGNOSIS — Z331 Pregnant state, incidental: Secondary | ICD-10-CM

## 2012-12-26 DIAGNOSIS — O99340 Other mental disorders complicating pregnancy, unspecified trimester: Secondary | ICD-10-CM

## 2012-12-26 DIAGNOSIS — O09299 Supervision of pregnancy with other poor reproductive or obstetric history, unspecified trimester: Secondary | ICD-10-CM

## 2012-12-26 DIAGNOSIS — Z1389 Encounter for screening for other disorder: Secondary | ICD-10-CM

## 2012-12-26 DIAGNOSIS — Z3482 Encounter for supervision of other normal pregnancy, second trimester: Secondary | ICD-10-CM

## 2012-12-26 DIAGNOSIS — F329 Major depressive disorder, single episode, unspecified: Secondary | ICD-10-CM

## 2012-12-26 LAB — POCT URINALYSIS DIPSTICK: Glucose, UA: NEGATIVE

## 2012-12-26 MED ORDER — ESCITALOPRAM OXALATE 20 MG PO TABS: 20.0000 mg | ORAL_TABLET | Freq: Every day | ORAL | Status: DC

## 2012-12-26 NOTE — Progress Notes (Signed)
Having increasind difficulty with issues related to her mother's recent death.  Has been on multiple meds in past.  Will start lexapro 20 mg a day

## 2012-12-26 NOTE — Progress Notes (Signed)
HAVING SOME DEPRESSION X 3 WEEK. DEPRESSION WORSEN LAST WEEK.

## 2013-01-12 ENCOUNTER — Other Ambulatory Visit: Payer: Self-pay | Admitting: Obstetrics & Gynecology

## 2013-01-12 ENCOUNTER — Ambulatory Visit (INDEPENDENT_AMBULATORY_CARE_PROVIDER_SITE_OTHER): Payer: Medicaid Other | Admitting: Obstetrics and Gynecology

## 2013-01-12 ENCOUNTER — Ambulatory Visit (INDEPENDENT_AMBULATORY_CARE_PROVIDER_SITE_OTHER): Payer: Medicaid Other

## 2013-01-12 VITALS — BP 114/72 | Wt 265.8 lb

## 2013-01-12 DIAGNOSIS — O99340 Other mental disorders complicating pregnancy, unspecified trimester: Secondary | ICD-10-CM

## 2013-01-12 DIAGNOSIS — Z3482 Encounter for supervision of other normal pregnancy, second trimester: Secondary | ICD-10-CM

## 2013-01-12 DIAGNOSIS — O09299 Supervision of pregnancy with other poor reproductive or obstetric history, unspecified trimester: Secondary | ICD-10-CM

## 2013-01-12 DIAGNOSIS — Z3492 Encounter for supervision of normal pregnancy, unspecified, second trimester: Secondary | ICD-10-CM

## 2013-01-12 DIAGNOSIS — N870 Mild cervical dysplasia: Secondary | ICD-10-CM

## 2013-01-12 DIAGNOSIS — Z1389 Encounter for screening for other disorder: Secondary | ICD-10-CM

## 2013-01-12 DIAGNOSIS — Z331 Pregnant state, incidental: Secondary | ICD-10-CM

## 2013-01-12 LAB — POCT URINALYSIS DIPSTICK: Ketones, UA: NEGATIVE

## 2013-01-12 NOTE — Progress Notes (Signed)
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

## 2013-01-12 NOTE — Progress Notes (Signed)
U/S(20+6wks)-active fetus, meas c/w dates, fluid WNL, ant/fundal gr 0 plac, cx long and closed 4.0cm, bilateral adnexa WNL, no obvious abnl noted although sub-optimal visualization due to maternal body habitus, female fetus

## 2013-02-09 ENCOUNTER — Ambulatory Visit (INDEPENDENT_AMBULATORY_CARE_PROVIDER_SITE_OTHER): Payer: Medicaid Other | Admitting: Advanced Practice Midwife

## 2013-02-09 ENCOUNTER — Encounter: Payer: Self-pay | Admitting: Advanced Practice Midwife

## 2013-02-09 VITALS — BP 110/60 | Wt 267.0 lb

## 2013-02-09 DIAGNOSIS — Z1389 Encounter for screening for other disorder: Secondary | ICD-10-CM

## 2013-02-09 DIAGNOSIS — O09299 Supervision of pregnancy with other poor reproductive or obstetric history, unspecified trimester: Secondary | ICD-10-CM

## 2013-02-09 DIAGNOSIS — O99340 Other mental disorders complicating pregnancy, unspecified trimester: Secondary | ICD-10-CM

## 2013-02-09 DIAGNOSIS — Z331 Pregnant state, incidental: Secondary | ICD-10-CM

## 2013-02-09 LAB — POCT URINALYSIS DIPSTICK
Glucose, UA: NEGATIVE
Leukocytes, UA: NEGATIVE
Nitrite, UA: NEGATIVE

## 2013-02-09 NOTE — Progress Notes (Signed)
No c/o at this time.  Routine questions about pregnancy answered.  F/U in 2 weeks for PN2/LROB.

## 2013-02-09 NOTE — Progress Notes (Signed)
CONCERN ABOUT HANDS AND SWELLING. ALL SO NOT SLEEPING AT NIGHT.

## 2013-02-09 NOTE — Patient Instructions (Signed)
Nothing to eat or drink after midnight before your next appointment & plan to be here for 2 hours (for your sugar test).  

## 2013-02-23 ENCOUNTER — Ambulatory Visit (INDEPENDENT_AMBULATORY_CARE_PROVIDER_SITE_OTHER): Payer: Medicaid Other | Admitting: Obstetrics and Gynecology

## 2013-02-23 VITALS — BP 90/70 | Wt 270.0 lb

## 2013-02-23 DIAGNOSIS — Z331 Pregnant state, incidental: Secondary | ICD-10-CM

## 2013-02-23 DIAGNOSIS — O99340 Other mental disorders complicating pregnancy, unspecified trimester: Secondary | ICD-10-CM

## 2013-02-23 DIAGNOSIS — Z1389 Encounter for screening for other disorder: Secondary | ICD-10-CM

## 2013-02-23 DIAGNOSIS — O09299 Supervision of pregnancy with other poor reproductive or obstetric history, unspecified trimester: Secondary | ICD-10-CM

## 2013-02-23 DIAGNOSIS — Z3480 Encounter for supervision of other normal pregnancy, unspecified trimester: Secondary | ICD-10-CM

## 2013-02-23 LAB — CBC
HCT: 35.2 % — ABNORMAL LOW (ref 36.0–46.0)
MCH: 30.8 pg (ref 26.0–34.0)
MCV: 92.6 fL (ref 78.0–100.0)
Platelets: 201 10*3/uL (ref 150–400)
RBC: 3.8 MIL/uL — ABNORMAL LOW (ref 3.87–5.11)
WBC: 12.3 10*3/uL — ABNORMAL HIGH (ref 4.0–10.5)

## 2013-02-23 LAB — POCT URINALYSIS DIPSTICK
Blood, UA: NEGATIVE
Nitrite, UA: NEGATIVE
Protein, UA: NEGATIVE

## 2013-02-23 NOTE — Progress Notes (Signed)
Pt c/o discharge with odor and "a lot of lower abdominal and vaginal pressure" aches to stand, right hip pain radiates to leg. No cx changes, posterior FH=U+16

## 2013-02-23 NOTE — Patient Instructions (Addendum)
Fetal Movement Counts Patient Name: __________________________________________________ Patient Due Date: ____________________ Performing a fetal movement count is highly recommended in high-risk pregnancies, but it is good for every pregnant woman to do. Your caregiver may ask you to start counting fetal movements at 28 weeks of the pregnancy. Fetal movements often increase:  After eating a full meal.  After physical activity.  After eating or drinking something sweet or cold. At rest.Preterm Labor Preterm labor is when labor starts at less than 37 weeks of pregnancy. The normal length of a pregnancy is 39 to 41 weeks. CAUSES Often, there is no identifiable underlying cause as to why a woman goes into preterm labor. However, one of the most common known causes of preterm labor is infection. Infections of the uterus, cervix, vagina, amniotic sac, bladder, kidney, or even the lungs (pneumonia) can cause labor to start. Other causes of preterm labor include: Urogenital infections, such as yeast infections and bacterial vaginosis. Uterine abnormalities (uterine shape, uterine septum, fibroids, bleeding from the placenta). A cervix that has been operated on and opens prematurely. Malformations in the baby. Multiple gestations (twins, triplets, and so on). Breakage of the amniotic sac.  Additional risk factors for preterm labor include: Previous history of preterm labor. Premature rupture of membranes (PROM). A placenta that covers the opening of the cervix (placenta previa). A placenta that separates from the uterus (placenta abruption). A cervix that is too weak to hold the baby in the uterus (incompetence cervix). Having too much fluid in the amniotic sac (polyhydramnios). Taking illegal drugs or smoking while pregnant. Not gaining enough weight while pregnant. Women younger than 73 and older than 31 years old. Low socioeconomic status. African-American ethnicity. SYMPTOMS Signs and  symptoms of preterm labor include: Menstrual-like cramps. Contractions that are 30 to 70 seconds apart, become very regular, closer together, and are more intense and painful. Contractions that start on the top of the uterus and spread down to the lower abdomen and back. A sense of increased pelvic pressure or back pain. A watery or bloody discharge that comes from the vagina. DIAGNOSIS  A diagnosis can be confirmed by: A vaginal exam. An ultrasound of the cervix. Sampling (swabbing) cervico-vaginal secretions. These samples can be tested for the presence of fetal fibronectin. This is a protein found in cervical discharge which is associated with preterm labor. Fetal monitoring. TREATMENT  Depending on the length of the pregnancy and other circumstances, a caregiver may suggest bed rest. If necessary, there are medicines that can be given to stop contractions and to quicken fetal lung maturity. If labor happens before 34 weeks of pregnancy, a prolonged hospital stay may be recommended. Treatment depends on the condition of both the mother and baby. PREVENTION There are some things a mother can do to lower the risk of preterm labor in future pregnancies. A woman can:  Stop smoking. Maintain healthy weight gain and avoid chemicals and drugs that are not necessary. Be watchful for any type of infection. Inform her caregiver if she has a known history of preterm labor. Document Released: 09/19/2003 Document Revised: 09/21/2011 Document Reviewed: 10/24/2010 [facility] Patient Information 2014 [place], [place].   Pay attention to when you feel the baby is most active. This will help you notice a pattern of your baby's sleep and wake cycles and what factors contribute to an increase in fetal movement. It is important to perform a fetal movement count at the same time each day when your baby is normally most active.  HOW TO COUNT  FETAL MOVEMENTS 1. Find a quiet and comfortable area to sit or lie down  on your left side. Lying on your left side provides the best blood and oxygen circulation to your baby. 2. Write down the day and time on a sheet of paper or in a journal. 3. Start counting kicks, flutters, swishes, rolls, or jabs in a 2 hour period. You should feel at least 10 movements within 2 hours. 4. If you do not feel 10 movements in 2 hours, wait 2 3 hours and count again. Look for a change in the pattern or not enough counts in 2 hours. SEEK MEDICAL CARE IF:  You feel less than 10 counts in 2 hours, tried twice.  There is no movement in over an hour.  The pattern is changing or taking longer each day to reach 10 counts in 2 hours.  You feel the baby is not moving as he or she usually does. Date: ____________ Movements: ____________ Start time: ____________ Doreatha Martin time: ____________  Date: ____________ Movements: ____________ Start time: ____________ Doreatha Martin time: ____________ Date: ____________ Movements: ____________ Start time: ____________ Doreatha Martin time: ____________ Date: ____________ Movements: ____________ Start time: ____________ Doreatha Martin time: ____________ Date: ____________ Movements: ____________ Start time: ____________ Doreatha Martin time: ____________ Date: ____________ Movements: ____________ Start time: ____________ Doreatha Martin time: ____________ Date: ____________ Movements: ____________ Start time: ____________ Doreatha Martin time: ____________ Date: ____________ Movements: ____________ Start time: ____________ Doreatha Martin time: ____________  Date: ____________ Movements: ____________ Start time: ____________ Doreatha Martin time: ____________ Date: ____________ Movements: ____________ Start time: ____________ Doreatha Martin time: ____________ Date: ____________ Movements: ____________ Start time: ____________ Doreatha Martin time: ____________ Date: ____________ Movements: ____________ Start time: ____________ Doreatha Martin time: ____________ Date: ____________ Movements: ____________ Start time: ____________ Doreatha Martin time:  ____________ Date: ____________ Movements: ____________ Start time: ____________ Doreatha Martin time: ____________ Date: ____________ Movements: ____________ Start time: ____________ Doreatha Martin time: ____________  Date: ____________ Movements: ____________ Start time: ____________ Doreatha Martin time: ____________ Date: ____________ Movements: ____________ Start time: ____________ Doreatha Martin time: ____________ Date: ____________ Movements: ____________ Start time: ____________ Doreatha Martin time: ____________ Date: ____________ Movements: ____________ Start time: ____________ Doreatha Martin time: ____________ Date: ____________ Movements: ____________ Start time: ____________ Doreatha Martin time: ____________ Date: ____________ Movements: ____________ Start time: ____________ Doreatha Martin time: ____________ Date: ____________ Movements: ____________ Start time: ____________ Doreatha Martin time: ____________  Date: ____________ Movements: ____________ Start time: ____________ Doreatha Martin time: ____________ Date: ____________ Movements: ____________ Start time: ____________ Doreatha Martin time: ____________ Date: ____________ Movements: ____________ Start time: ____________ Doreatha Martin time: ____________ Date: ____________ Movements: ____________ Start time: ____________ Doreatha Martin time: ____________ Date: ____________ Movements: ____________ Start time: ____________ Doreatha Martin time: ____________ Date: ____________ Movements: ____________ Start time: ____________ Doreatha Martin time: ____________ Date: ____________ Movements: ____________ Start time: ____________ Doreatha Martin time: ____________  Date: ____________ Movements: ____________ Start time: ____________ Doreatha Martin time: ____________ Date: ____________ Movements: ____________ Start time: ____________ Doreatha Martin time: ____________ Date: ____________ Movements: ____________ Start time: ____________ Doreatha Martin time: ____________ Date: ____________ Movements: ____________ Start time: ____________ Doreatha Martin time: ____________ Date: ____________ Movements:  ____________ Start time: ____________ Doreatha Martin time: ____________ Date: ____________ Movements: ____________ Start time: ____________ Doreatha Martin time: ____________ Date: ____________ Movements: ____________ Start time: ____________ Doreatha Martin time: ____________  Date: ____________ Movements: ____________ Start time: ____________ Doreatha Martin time: ____________ Date: ____________ Movements: ____________ Start time: ____________ Doreatha Martin time: ____________ Date: ____________ Movements: ____________ Start time: ____________ Doreatha Martin time: ____________ Date: ____________ Movements: ____________ Start time: ____________ Doreatha Martin time: ____________ Date: ____________ Movements: ____________ Start time: ____________ Doreatha Martin time: ____________ Date: ____________ Movements: ____________ Start time: ____________ Doreatha Martin time: ____________ Date: ____________ Movements: ____________ Start time: ____________ Doreatha Martin time: ____________  Date: ____________ Movements: ____________ Start time: ____________ Doreatha Martin time: ____________ Date: ____________ Movements: ____________ Start time: ____________ Doreatha Martin time: ____________ Date: ____________ Movements: ____________ Start time: ____________ Doreatha Martin time: ____________ Date: ____________ Movements: ____________ Start time: ____________ Doreatha Martin time: ____________ Date: ____________ Movements: ____________ Start time: ____________ Doreatha Martin time: ____________ Date: ____________ Movements: ____________ Start time: ____________ Doreatha Martin time: ____________ Date: ____________ Movements: ____________ Start time: ____________ Doreatha Martin time: ____________  Date: ____________ Movements: ____________ Start time: ____________ Doreatha Martin time: ____________ Date: ____________ Movements: ____________ Start time: ____________ Doreatha Martin time: ____________ Date: ____________ Movements: ____________ Start time: ____________ Doreatha Martin time: ____________ Date: ____________ Movements: ____________ Start time: ____________ Doreatha Martin  time: ____________ Date: ____________ Movements: ____________ Start time: ____________ Doreatha Martin time: ____________ Date: ____________ Movements: ____________ Start time: ____________ Doreatha Martin time: ____________ Document Released: 07/29/2006 Document Revised: 06/15/2012 Document Reviewed: 04/25/2012 [employer] Patient Information 2014 [employer]

## 2013-02-24 LAB — HSV 2 ANTIBODY, IGG: HSV 2 Glycoprotein G Ab, IgG: <0.1 IV

## 2013-02-24 LAB — GLUCOSE TOLERANCE, 2 HOURS W/ 1HR
Glucose, 1 hour: 136 mg/dL (ref 70–170)
Glucose, 2 hour: 105 mg/dL (ref 70–139)
Glucose, Fasting: 84 mg/dL (ref 70–99)

## 2013-02-24 LAB — ANTIBODY SCREEN: Antibody Screen: NEGATIVE

## 2013-02-26 ENCOUNTER — Encounter: Payer: Self-pay | Admitting: Obstetrics and Gynecology

## 2013-03-10 ENCOUNTER — Encounter: Payer: Self-pay | Admitting: Obstetrics & Gynecology

## 2013-03-10 ENCOUNTER — Ambulatory Visit (INDEPENDENT_AMBULATORY_CARE_PROVIDER_SITE_OTHER): Payer: Medicaid Other | Admitting: Obstetrics & Gynecology

## 2013-03-10 VITALS — BP 102/60 | Wt 272.0 lb

## 2013-03-10 DIAGNOSIS — O9989 Other specified diseases and conditions complicating pregnancy, childbirth and the puerperium: Secondary | ICD-10-CM

## 2013-03-10 DIAGNOSIS — O99340 Other mental disorders complicating pregnancy, unspecified trimester: Secondary | ICD-10-CM

## 2013-03-10 DIAGNOSIS — Z331 Pregnant state, incidental: Secondary | ICD-10-CM

## 2013-03-10 DIAGNOSIS — O09299 Supervision of pregnancy with other poor reproductive or obstetric history, unspecified trimester: Secondary | ICD-10-CM

## 2013-03-10 DIAGNOSIS — Z1389 Encounter for screening for other disorder: Secondary | ICD-10-CM

## 2013-03-10 DIAGNOSIS — O26899 Other specified pregnancy related conditions, unspecified trimester: Secondary | ICD-10-CM

## 2013-03-10 LAB — POCT URINALYSIS DIPSTICK
Blood, UA: NEGATIVE
Glucose, UA: NEGATIVE
Nitrite, UA: NEGATIVE
Protein, UA: NEGATIVE

## 2013-03-10 NOTE — Progress Notes (Signed)
No bleeding no contraction good FM constant pain above umbilicus, exam seems like musculoskeletal gall bladder out, not related to history of umbilical hernia repair If clinical situation chages go to [facility]

## 2013-03-10 NOTE — Addendum Note (Signed)
Addended by: Criss Alvine on: 03/10/2013 01:02 PM   Modules accepted: Orders

## 2013-03-10 NOTE — Patient Instructions (Signed)
Abdominal Pain During Pregnancy  Abdominal discomfort is common in pregnancy. Most of the time, it does not cause harm. There are many causes of abdominal pain. Some causes are more serious than others. Some of the causes of abdominal pain in pregnancy are easily diagnosed. Occasionally, the diagnosis takes time to understand. Other times, the cause is not determined. Abdominal pain can be a sign that something is very wrong with the pregnancy, or the pain may have nothing to do with the pregnancy at all. For this reason, always tell your caregiver if you have any abdominal discomfort.  CAUSES  Common and harmless causes of abdominal pain include:   Constipation.   Excess gas and bloating.   Round ligament pain. This is pain that is felt in the folds of the groin.   The position the baby or placenta is in.   Baby kicks.   Braxton-Hicks contractions. These are mild contractions that do not cause cervical dilation.  Serious causes of abdominal pain include:   Ectopic pregnancy. This happens when a fertilized egg implants outside of the uterus.   Miscarriage.   Preterm labor. This is when labor starts at less than 37 weeks of pregnancy.   Placental abruption. This is when the placenta partially or completely separates from the uterus.   Preeclampsia. This is often associated with high blood pressure and has been referred to as "toxemia in pregnancy."   Uterine or amniotic fluid infections.  Causes unrelated to pregnancy include:   Urinary tract infection.   Gallbladder stones or inflammation.   Hepatitis or other liver illness.   Intestinal problems, stomach flu, food poisoning, or ulcer.   Appendicitis.   Kidney (renal) stones.   Kidney infection (pylonephritis).  HOME CARE INSTRUCTIONS   For mild pain:   Do not have sexual intercourse or put anything in your vagina until your symptoms go away completely.   Get plenty of rest until your pain improves. If your pain does not improve in 1 hour, call  your caregiver.   Drink clear fluids if you feel nauseous. Avoid solid food as long as you are uncomfortable or nauseous.   Only take medicine as directed by your caregiver.   Keep all follow-up appointments with your caregiver.  SEEK IMMEDIATE MEDICAL CARE IF:   You are bleeding, leaking fluid, or passing tissue from the vagina.   You have increasing pain or cramping.   You have persistent vomiting.   You have painful or bloody urination.   You have a fever.   You notice a decrease in your baby's movements.   You have extreme weakness or feel faint.   You have shortness of breath, with or without abdominal pain.   You develop a severe headache with abdominal pain.   You have abnormal vaginal discharge with abdominal pain.   You have persistent diarrhea.   You have abdominal pain that continues even after rest, or gets worse.  MAKE SURE YOU:    Understand these instructions.   Will watch your condition.   Will get help right away if you are not doing well or get worse.  Document Released: 06/29/2005 Document Revised: 09/21/2011 Document Reviewed: 01/23/2011  Exit[employer] Patient Information 2014 [employer].

## 2013-03-10 NOTE — Progress Notes (Signed)
C/C Stomch hurt. Unable to sleep,for last 3 night.

## 2013-03-23 ENCOUNTER — Encounter: Payer: Self-pay | Admitting: Advanced Practice Midwife

## 2013-03-23 ENCOUNTER — Ambulatory Visit (INDEPENDENT_AMBULATORY_CARE_PROVIDER_SITE_OTHER): Payer: Medicaid Other | Admitting: Advanced Practice Midwife

## 2013-03-23 VITALS — BP 110/60 | Wt 274.0 lb

## 2013-03-23 DIAGNOSIS — O09299 Supervision of pregnancy with other poor reproductive or obstetric history, unspecified trimester: Secondary | ICD-10-CM

## 2013-03-23 DIAGNOSIS — Z1389 Encounter for screening for other disorder: Secondary | ICD-10-CM

## 2013-03-23 DIAGNOSIS — O99340 Other mental disorders complicating pregnancy, unspecified trimester: Secondary | ICD-10-CM

## 2013-03-23 DIAGNOSIS — Z331 Pregnant state, incidental: Secondary | ICD-10-CM

## 2013-03-23 LAB — POCT URINALYSIS DIPSTICK
Ketones, UA: NEGATIVE
Protein, UA: NEGATIVE

## 2013-03-23 MED ORDER — ALBUTEROL SULFATE HFA 108 (90 BASE) MCG/ACT IN AERS: 2.0000 | INHALATION_SPRAY | Freq: Four times a day (QID) | RESPIRATORY_TRACT | Status: DC | PRN

## 2013-03-23 MED ORDER — BUTALBITAL-APAP-CAFFEINE 50-325-40 MG PO TABS: 1.0000 | ORAL_TABLET | Freq: Four times a day (QID) | ORAL | Status: DC | PRN

## 2013-03-23 MED ORDER — CYCLOBENZAPRINE HCL 10 MG PO TABS: 10.0000 mg | ORAL_TABLET | Freq: Three times a day (TID) | ORAL | Status: DC | PRN

## 2013-04-06 ENCOUNTER — Encounter: Payer: Self-pay | Admitting: Advanced Practice Midwife

## 2013-04-06 ENCOUNTER — Ambulatory Visit (INDEPENDENT_AMBULATORY_CARE_PROVIDER_SITE_OTHER): Payer: Medicaid Other | Admitting: Advanced Practice Midwife

## 2013-04-06 ENCOUNTER — Ambulatory Visit (INDEPENDENT_AMBULATORY_CARE_PROVIDER_SITE_OTHER): Payer: Medicaid Other

## 2013-04-06 ENCOUNTER — Other Ambulatory Visit: Payer: Self-pay | Admitting: Advanced Practice Midwife

## 2013-04-06 VITALS — BP 110/76 | Wt 274.5 lb

## 2013-04-06 DIAGNOSIS — O26849 Uterine size-date discrepancy, unspecified trimester: Secondary | ICD-10-CM

## 2013-04-06 DIAGNOSIS — Z3490 Encounter for supervision of normal pregnancy, unspecified, unspecified trimester: Secondary | ICD-10-CM

## 2013-04-06 DIAGNOSIS — Z331 Pregnant state, incidental: Secondary | ICD-10-CM

## 2013-04-06 DIAGNOSIS — Z1389 Encounter for screening for other disorder: Secondary | ICD-10-CM

## 2013-04-06 DIAGNOSIS — O99340 Other mental disorders complicating pregnancy, unspecified trimester: Secondary | ICD-10-CM

## 2013-04-06 DIAGNOSIS — O09299 Supervision of pregnancy with other poor reproductive or obstetric history, unspecified trimester: Secondary | ICD-10-CM

## 2013-04-06 LAB — POCT URINALYSIS DIPSTICK
Glucose, UA: NEGATIVE
Ketones, UA: NEGATIVE
Leukocytes, UA: NEGATIVE
Protein, UA: NEGATIVE

## 2013-04-06 MED ORDER — OMEPRAZOLE 20 MG PO CPDR: 20.0000 mg | DELAYED_RELEASE_CAPSULE | Freq: Every day | ORAL | Status: DC

## 2013-04-06 NOTE — Progress Notes (Signed)
U/S(32+6wks)-Breech, active fetus, approp growth EFW 4 lb 3 oz (34th%tile), fluid wnl AFI-8.3cm, ant gr 1 plac, female fetus
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
## 2013-04-20 ENCOUNTER — Encounter: Payer: Self-pay | Admitting: Advanced Practice Midwife

## 2013-04-20 ENCOUNTER — Encounter (INDEPENDENT_AMBULATORY_CARE_PROVIDER_SITE_OTHER): Payer: Self-pay

## 2013-04-20 ENCOUNTER — Ambulatory Visit (INDEPENDENT_AMBULATORY_CARE_PROVIDER_SITE_OTHER): Payer: Medicaid Other | Admitting: Advanced Practice Midwife

## 2013-04-20 VITALS — BP 120/80 | Wt 278.0 lb

## 2013-04-20 DIAGNOSIS — O99340 Other mental disorders complicating pregnancy, unspecified trimester: Secondary | ICD-10-CM

## 2013-04-20 DIAGNOSIS — Z1389 Encounter for screening for other disorder: Secondary | ICD-10-CM

## 2013-04-20 DIAGNOSIS — Z3490 Encounter for supervision of normal pregnancy, unspecified, unspecified trimester: Secondary | ICD-10-CM

## 2013-04-20 DIAGNOSIS — Z23 Encounter for immunization: Secondary | ICD-10-CM

## 2013-04-20 DIAGNOSIS — O09299 Supervision of pregnancy with other poor reproductive or obstetric history, unspecified trimester: Secondary | ICD-10-CM

## 2013-04-20 DIAGNOSIS — Z331 Pregnant state, incidental: Secondary | ICD-10-CM

## 2013-04-20 LAB — POCT URINALYSIS DIPSTICK
Nitrite, UA: NEGATIVE
Protein, UA: NEGATIVE

## 2013-04-20 MED ORDER — BUTALBITAL-APAP-CAFFEINE 50-325-40 MG PO TABS: 1.0000 | ORAL_TABLET | Freq: Four times a day (QID) | ORAL | Status: DC | PRN

## 2013-04-20 MED ORDER — INFLUENZA VAC SPLIT QUAD 0.5 ML IM SUSP
0.5000 mL | Freq: Once | INTRAMUSCULAR | Status: AC
  Administered 2013-04-20: 0.5 mL via INTRAMUSCULAR

## 2013-04-20 NOTE — Progress Notes (Signed)
FOB "changed" for a few days. Encouraged to seek counseling again, get on track before baby comes.  Will check position next visit  Interested in ECV.

## 2013-04-28 ENCOUNTER — Telehealth: Payer: Self-pay | Admitting: *Deleted

## 2013-04-28 NOTE — Telephone Encounter (Signed)
Pt states very nauseated this am, unable to eat anything without it making pt vomit. Pt states has phenergan and helps when she takes it but has son today and has not taken phenergan due to making pt sleepy. Pt told to try saltine crackers and sprite until her Grandmother can come and assist pt with son and can take phenergan. Pt verbalized understanding.

## 2013-05-02 ENCOUNTER — Encounter: Payer: Self-pay | Admitting: Women's Health

## 2013-05-02 ENCOUNTER — Ambulatory Visit (INDEPENDENT_AMBULATORY_CARE_PROVIDER_SITE_OTHER): Payer: Medicaid Other | Admitting: Women's Health

## 2013-05-02 VITALS — BP 110/70 | Wt 284.0 lb

## 2013-05-02 DIAGNOSIS — O321XX0 Maternal care for breech presentation, not applicable or unspecified: Secondary | ICD-10-CM | POA: Insufficient documentation

## 2013-05-02 DIAGNOSIS — O99019 Anemia complicating pregnancy, unspecified trimester: Secondary | ICD-10-CM

## 2013-05-02 DIAGNOSIS — O99340 Other mental disorders complicating pregnancy, unspecified trimester: Secondary | ICD-10-CM

## 2013-05-02 DIAGNOSIS — F329 Major depressive disorder, single episode, unspecified: Secondary | ICD-10-CM

## 2013-05-02 DIAGNOSIS — O09299 Supervision of pregnancy with other poor reproductive or obstetric history, unspecified trimester: Secondary | ICD-10-CM

## 2013-05-02 DIAGNOSIS — Z3483 Encounter for supervision of other normal pregnancy, third trimester: Secondary | ICD-10-CM

## 2013-05-02 DIAGNOSIS — Z331 Pregnant state, incidental: Secondary | ICD-10-CM

## 2013-05-02 DIAGNOSIS — Z1389 Encounter for screening for other disorder: Secondary | ICD-10-CM

## 2013-05-02 DIAGNOSIS — O321XX1 Maternal care for breech presentation, fetus 1: Secondary | ICD-10-CM

## 2013-05-02 LAB — POCT URINALYSIS DIPSTICK
Blood, UA: NEGATIVE
Glucose, UA: NEGATIVE
Ketones, UA: NEGATIVE
Nitrite, UA: NEGATIVE
Protein, UA: NEGATIVE

## 2013-05-02 NOTE — Progress Notes (Signed)
C/C PAIN. CONTRACTIONS IRREGULAR.

## 2013-05-02 NOTE — Progress Notes (Signed)
Reports good fm. Denies uc's, lof, vb, urinary frequency, urgency, hesitancy, or dysuria.  Lots of LBP, hip pain. Reviewed relief measures. GBS today. SVE 1.5/th/-3.  Fetus still breech via informal u/s. Discussed w/ [facility], to come back thurs for formal u/s for afi/efw/placenta location, then fri for visit w/ him to discuss ECV.  All questions answered.

## 2013-05-02 NOTE — Patient Instructions (Addendum)
Preterm Labor Preterm labor is when labor starts at less than 37 weeks of pregnancy. The normal length of a pregnancy is 39 to 41 weeks. CAUSES Often, there is no identifiable underlying cause as to why a woman goes into preterm labor. However, one of the most common known causes of preterm labor is infection. Infections of the uterus, cervix, vagina, amniotic sac, bladder, kidney, or even the lungs (pneumonia) can cause labor to start. Other causes of preterm labor include:  Urogenital infections, such as yeast infections and bacterial vaginosis.  Uterine abnormalities (uterine shape, uterine septum, fibroids, bleeding from the placenta).  A cervix that has been operated on and opens prematurely.  Malformations in the baby.  Multiple gestations (twins, triplets, and so on).  Breakage of the amniotic sac. Additional risk factors for preterm labor include:  Previous history of preterm labor.  Premature rupture of membranes (PROM).  A placenta that covers the opening of the cervix (placenta previa).  A placenta that separates from the uterus (placenta abruption).  A cervix that is too weak to hold the baby in the uterus (incompetence cervix).  Having too much fluid in the amniotic sac (polyhydramnios).  Taking illegal drugs or smoking while pregnant.  Not gaining enough weight while pregnant.  Women younger than 68 and older than 31 years old.  Low socioeconomic status.  African-American ethnicity. SYMPTOMS Signs and symptoms of preterm labor include:  Menstrual-like cramps.  Contractions that are 30 to 70 seconds apart, become very regular, closer together, and are more intense and painful.  Contractions that start on the top of the uterus and spread down to the lower abdomen and back.  A sense of increased pelvic pressure or back pain.  A watery or bloody discharge that comes from the vagina. DIAGNOSIS  A diagnosis can be confirmed by:  A vaginal exam.  An  ultrasound of the cervix.  Sampling (swabbing) cervico-vaginal secretions. These samples can be tested for the presence of fetal fibronectin. This is a protein found in cervical discharge which is associated with preterm labor.  Fetal monitoring. TREATMENT  Depending on the length of the pregnancy and other circumstances, a caregiver may suggest bed rest. If necessary, there are medicines that can be given to stop contractions and to quicken fetal lung maturity. If labor happens before 34 weeks of pregnancy, a prolonged hospital stay may be recommended. Treatment depends on the condition of both the mother and baby. PREVENTION There are some things a mother can do to lower the risk of preterm labor in future pregnancies. A woman can:   Stop smoking.  Maintain healthy weight gain and avoid chemicals and drugs that are not necessary.  Be watchful for any type of infection.  Inform her caregiver if she has a known history of preterm labor. Document Released: 09/19/2003 Document Revised: 09/21/2011 Document Reviewed: 10/24/2010 [facility] Patient Information 2014 [place], [place]. External Cephalic Version External cephalic version is turning a baby that is presenting their buttocks first (breech) or is lying sideways in the uterus (transverse) to a head-first position. This makes the labor and delivery faster, safer for the mother and baby, and lessens the chance for a Cesarean section. It should not be tried until the pregnancy is [redacted] weeks along or longer. BEFORE THE PROCEDURE   Do not take aspirin.  Do not eat for 4 hours before the procedure.  Tell your caregiver if you have a cold, fever or an infection.  Tell your caregiver if you are having contractions.  Tell your caregiver if you are leaking or had a gush of fluid from your vagina.  Tell your caregiver if you have any vaginal bleeding or abnormal discharge.  If you are being admitted the same day, arrive at the hospital at least  one hour before the procedure to sign any necessary documents and to get prepared for the procedure.  Tell your caregiver if you had any problems with anesthetics in the past.  Tell your caregiver if you are taking any medications that your caregiver does not know about. This includes over-the-counter and prescription drugs, herbs, eye drops and creams. PROCEDURE  First, an ultrasound is done to make sure the baby is breech or transverse.  A non-stress test or biophysical profile is done on the baby before the ECV. This is done to make sure it is safe for the baby to have the ECV. It may also be done after the procedure to make sure the baby is OK.  ECV is done in the delivery/surgical room with an anesthesiologist present. There should be a setup for an emergency Cesarean section with a full nursing and nursery staff available and ready.  The patient may be given a medication to relax the uterine muscles. An epidural may be given for any discomfort. It is helpful for the success of the ECV.  An electronic fetal monitor is placed on the uterus during the procedure to make sure the baby is OK.  If the mother is Rh negative, Rho-gam will be given to her to prevent Rh problems for future pregnancies.  The mother is followed closely for 2 to 3 hours after the procedure to make sure no problems develop. BENEFITS OF ECV  Easier and safer labor and delivery for the mother and baby.  Lower incidence of Cesarean section.  Lower costs with a vaginal delivery. RISKS OF ECV  The placenta pulls away from the wall of the uterus before delivery (abruption of the placenta).  Rupture of the uterus, especially in patients with a previous Cesarean section.  Fetal distress.  Early (premature) labor.  Premature rupture of the membranes.  The baby will return to the breech or transverse lie position.  Death of the fetus can happen, but is very rare. ECV SHOULD BE STOPPED IF:  The fetal heart  tones drop.  The mother is having a lot of pain.  You cannot turn the baby after several attempts. ECV SHOULD NOT BE DONE IF:  The non-stress test or biophysical profile is abnormal.  There is vaginal bleeding.  An abnormal shaped uterus is present.  There is heart disease or uncontrolled high blood pressure in the mother.  There are twins or more.  The placenta covers the opening of the cervix (placenta previa).  You had a previous cesarean section with a classical incision or major surgery of the uterus.  There is not enough amniotic fluid in the sac (oligohydramnios).  The baby is too small for the pregnancy or has not developed normally (anomaly).  Your membranes have ruptured. HOME CARE INSTRUCTIONS   Have someone take you home after the procedure.  Rest at home for several hours.  Have someone stay with you for a few hours after you get home.  After ECV, continue with your prenatal visits as directed.  Continue your regular diet, rest and activities.  Do not do any strenuous activities for a couple of days. SEEK IMMEDIATE MEDICAL CARE IF:   You develop vaginal bleeding.  You have fluid coming  out of your vagina (bag of water may have broken).  You develop uterine contractions.  You do not feel the baby move or there is less movement of the baby.  You develop abdominal pain.  You develop an oral temperature of 102 F (38.9 C) or higher. Document Released: 12/22/2006 Document Revised: 09/21/2011 Document Reviewed: 10/17/2008 [facility] Patient Information 2014 [place], [place].

## 2013-05-03 LAB — GC/CHLAMYDIA PROBE AMP
CT Probe RNA: NEGATIVE
GC Probe RNA: NEGATIVE

## 2013-05-04 ENCOUNTER — Other Ambulatory Visit: Payer: Self-pay | Admitting: Women's Health

## 2013-05-04 ENCOUNTER — Ambulatory Visit (INDEPENDENT_AMBULATORY_CARE_PROVIDER_SITE_OTHER): Payer: Medicaid Other

## 2013-05-04 DIAGNOSIS — O321XX0 Maternal care for breech presentation, not applicable or unspecified: Secondary | ICD-10-CM

## 2013-05-04 DIAGNOSIS — O321XX1 Maternal care for breech presentation, fetus 1: Secondary | ICD-10-CM

## 2013-05-04 LAB — STREP B DNA PROBE: GBSP: POSITIVE

## 2013-05-04 NOTE — Progress Notes (Addendum)
U/S(36+6wks)-Frank BREECH, active fetus, EFW 5 lb 8 oz (13th%tile), fluid wnl AFI-9.8cm, Anterior Gr 2 placenta, female fetus, fetal facing Pt anterior Left side with feet next to the head

## 2013-05-05 ENCOUNTER — Encounter (HOSPITAL_COMMUNITY): Payer: Self-pay | Admitting: *Deleted

## 2013-05-05 ENCOUNTER — Encounter: Payer: Self-pay | Admitting: Obstetrics & Gynecology

## 2013-05-05 ENCOUNTER — Telehealth (HOSPITAL_COMMUNITY): Payer: Self-pay | Admitting: *Deleted

## 2013-05-05 ENCOUNTER — Ambulatory Visit (INDEPENDENT_AMBULATORY_CARE_PROVIDER_SITE_OTHER): Payer: Medicaid Other | Admitting: Obstetrics & Gynecology

## 2013-05-05 DIAGNOSIS — Z331 Pregnant state, incidental: Secondary | ICD-10-CM

## 2013-05-05 DIAGNOSIS — O99340 Other mental disorders complicating pregnancy, unspecified trimester: Secondary | ICD-10-CM

## 2013-05-05 DIAGNOSIS — O09299 Supervision of pregnancy with other poor reproductive or obstetric history, unspecified trimester: Secondary | ICD-10-CM

## 2013-05-05 DIAGNOSIS — O321XX0 Maternal care for breech presentation, not applicable or unspecified: Secondary | ICD-10-CM

## 2013-05-05 DIAGNOSIS — Z1389 Encounter for screening for other disorder: Secondary | ICD-10-CM

## 2013-05-05 DIAGNOSIS — O99019 Anemia complicating pregnancy, unspecified trimester: Secondary | ICD-10-CM

## 2013-05-05 LAB — POCT URINALYSIS DIPSTICK
Blood, UA: NEGATIVE
Glucose, UA: NEGATIVE
Nitrite, UA: NEGATIVE
Protein, UA: NEGATIVE

## 2013-05-05 NOTE — Telephone Encounter (Signed)
Preadmission screen  

## 2013-05-05 NOTE — Progress Notes (Signed)
ECV discussed in great detail including risks and benefits.  She opts for ECV attempt  Dr Emelda Fear is on call Wednesday and scheduled for that day at 6pm BP weight and urine results all reviewed and noted. Patient reports good fetal movement, denies any bleeding and no rupture of membranes symptoms or regular contractions. Patient is without complaints. All questions were answered.

## 2013-05-06 ENCOUNTER — Encounter: Payer: Self-pay | Admitting: Women's Health

## 2013-05-10 ENCOUNTER — Encounter (HOSPITAL_COMMUNITY): Payer: Self-pay | Admitting: *Deleted

## 2013-05-10 ENCOUNTER — Observation Stay (HOSPITAL_COMMUNITY)
Admission: AD | Admit: 2013-05-10 | Discharge: 2013-05-10 | Disposition: A | Discharge status: Home / Self Care | Payer: Medicaid Other | Source: Ambulatory Visit | Attending: Obstetrics and Gynecology | Admitting: Obstetrics and Gynecology

## 2013-05-10 ENCOUNTER — Inpatient Hospital Stay (HOSPITAL_COMMUNITY): Admission: RE | Admit: 2013-05-10 | Payer: Medicaid Other | Source: Ambulatory Visit

## 2013-05-10 DIAGNOSIS — IMO0002 Reserved for concepts with insufficient information to code with codable children: Secondary | ICD-10-CM

## 2013-05-10 DIAGNOSIS — L732 Hidradenitis suppurativa: Secondary | ICD-10-CM

## 2013-05-10 DIAGNOSIS — O321XX1 Maternal care for breech presentation, fetus 1: Secondary | ICD-10-CM

## 2013-05-10 DIAGNOSIS — O219 Vomiting of pregnancy, unspecified: Secondary | ICD-10-CM

## 2013-05-10 DIAGNOSIS — O321XX0 Maternal care for breech presentation, not applicable or unspecified: Principal | ICD-10-CM | POA: Insufficient documentation

## 2013-05-10 DIAGNOSIS — F3132 Bipolar disorder, current episode depressed, moderate: Secondary | ICD-10-CM

## 2013-05-10 DIAGNOSIS — Z3483 Encounter for supervision of other normal pregnancy, third trimester: Secondary | ICD-10-CM

## 2013-05-10 HISTORY — DX: Reserved for concepts with insufficient information to code with codable children: IMO0002

## 2013-05-10 HISTORY — DX: Unspecified abnormal cytological findings in specimens from cervix uteri: R87.619

## 2013-05-10 MED ORDER — TERBUTALINE SULFATE 1 MG/ML IJ SOLN
0.2500 mg | Freq: Once | INTRAMUSCULAR | Status: AC
  Administered 2013-05-10: 0.25 mg via SUBCUTANEOUS

## 2013-05-10 MED ORDER — TERBUTALINE SULFATE 1 MG/ML IJ SOLN
INTRAMUSCULAR | Status: AC
  Filled 2013-05-10: qty 1

## 2013-05-10 NOTE — Discharge Summary (Addendum)
Discharge note:  Procedure: External cephalic version performed under terbutalene tocolysis, with successful forward clockwise roll technique done without difficulty .  NST post procedure reactive.  followup 2 days fam tree.

## 2013-05-10 NOTE — Progress Notes (Signed)
External version performed, under u/s guidance, with forward roll technique within 5 minutes. Nst post procedure reactive.  Consents obtained . Will discharge home this pm.Blood type Rh pos

## 2013-05-10 NOTE — OB Triage Note (Addendum)
Pt discharged to home after successful version.  Increase activity slowly.  Eat a low sodium heart healthy diet.  Call if temp above 100.4 or severe pain.  Orders to return to [facility] in 2 days for check up.  Discharge instructions given.  Pt verb. Understanding of all cares and routines. B. Kristeen Mans

## 2013-05-10 NOTE — Progress Notes (Signed)
Subjective:  Tina Ross is a 32 y.o. G4 P2 female with EDC 11/14/14at 37 and 5/[redacted] weeks gestation who is being admitted for ext version.  Her current obstetrical history is significant for breech presentation.  Patient reports no complaints and minor cramps x 2-3 days.   Fetal Movement: normal.     Objective:   Vital signs in last 24 hours: Temp:  [98.6 F (37 C)-98.7 F (37.1 C)] 98.6 F (37 C) (10/29 2017) Pulse Rate:  [87-98] 98 (10/29 2017) Resp:  [20] 20 (10/29 2017) BP: (120-127)/(69-81) 120/69 mmHg (10/29 2017) Weight:  [128.368 kg (283 lb)] 128.368 kg (283 lb) (10/29 1804)   General:   alert, cooperative and no distress  Skin:   normal  HEENT:  PERRLA  Lungs:   clear to auscultation bilaterally  Heart:   regular rate and rhythm  Breasts:     Abdomen:  gravid uterus, u/s shows a breech presentation , normal fluid, anterior placenta, abdominal wall laxity  Pelvis:  Vulva and vagina appear normal. Bimanual exam reveals normal uterus and adnexa.  FHT:  145  BPM  Uterine Size: size equals dates  Presentations: breech  Cervix:    Dilation:    Effacement:    Station:     Consistency:    Position:    Lab Review  Rh+  AFP:NML  One hour GTT: Normal    Assessment/Plan:  37 and 5/[redacted] weeks gestation. Not in labor. Obstetrical history significant for breech presentation .     Risks, benefits, alternatives and possible complications have been discussed in detail with the patient.  Pre-admission, admission, and post admission procedures and expectations were discussed in detail.  All questions answered, all appropriate consents will be signed at the Hospital. Admission is planned for today.  Patient consents for ext version.

## 2013-05-10 NOTE — Progress Notes (Signed)
Providers at bedside, baby vertex per [place] after version per Dr. Emelda Fear.  Monitors applied

## 2013-05-10 NOTE — Progress Notes (Signed)
DC orders gone over with pt.  Pt verb. Understanding of all cares and routines.  Strip reviewed with second RN.  Saline lock dc'd.

## 2013-05-11 ENCOUNTER — Encounter (HOSPITAL_COMMUNITY): Payer: Self-pay | Admitting: *Deleted

## 2013-05-11 ENCOUNTER — Telehealth: Payer: Self-pay | Admitting: *Deleted

## 2013-05-11 ENCOUNTER — Inpatient Hospital Stay (HOSPITAL_COMMUNITY)
Admission: AD | Admit: 2013-05-11 | Discharge: 2013-05-11 | Disposition: A | Discharge status: Home / Self Care | Payer: Medicaid Other | Source: Ambulatory Visit | Attending: Family Medicine | Admitting: Family Medicine

## 2013-05-11 DIAGNOSIS — O479 False labor, unspecified: Secondary | ICD-10-CM | POA: Insufficient documentation

## 2013-05-11 NOTE — MAU Note (Signed)
Pt states she has been contracting for about 1wk. Pt had version 05/12/13. Pt states "it was successful'

## 2013-05-11 NOTE — Telephone Encounter (Signed)
Pt states Dr. Emelda Fear "turned the baby due to baby being breech, c/o abdominal pain, and bloody discharge. Per Cyril Mourning, NP pt to go to [facility] to be evaluated. Pt verbalized understanding and waiting on [facility] to come [facility]

## 2013-05-12 ENCOUNTER — Ambulatory Visit (INDEPENDENT_AMBULATORY_CARE_PROVIDER_SITE_OTHER): Payer: Medicaid Other | Admitting: Obstetrics and Gynecology

## 2013-05-12 ENCOUNTER — Encounter: Payer: Self-pay | Admitting: Obstetrics and Gynecology

## 2013-05-12 VITALS — BP 116/76 | Wt 286.0 lb

## 2013-05-12 DIAGNOSIS — O99340 Other mental disorders complicating pregnancy, unspecified trimester: Secondary | ICD-10-CM

## 2013-05-12 DIAGNOSIS — Z1389 Encounter for screening for other disorder: Secondary | ICD-10-CM

## 2013-05-12 DIAGNOSIS — O99891 Other specified diseases and conditions complicating pregnancy: Secondary | ICD-10-CM

## 2013-05-12 DIAGNOSIS — O321XX0 Maternal care for breech presentation, not applicable or unspecified: Secondary | ICD-10-CM

## 2013-05-12 DIAGNOSIS — O09299 Supervision of pregnancy with other poor reproductive or obstetric history, unspecified trimester: Secondary | ICD-10-CM

## 2013-05-12 DIAGNOSIS — Z331 Pregnant state, incidental: Secondary | ICD-10-CM

## 2013-05-12 DIAGNOSIS — O321XX1 Maternal care for breech presentation, fetus 1: Secondary | ICD-10-CM

## 2013-05-12 DIAGNOSIS — O99019 Anemia complicating pregnancy, unspecified trimester: Secondary | ICD-10-CM

## 2013-05-12 LAB — POCT URINALYSIS DIPSTICK
Glucose, UA: NEGATIVE
Ketones, UA: NEGATIVE
Leukocytes, UA: NEGATIVE
Nitrite, UA: NEGATIVE
Protein, UA: NEGATIVE

## 2013-05-12 MED ORDER — BUTALBITAL-APAP-CAFFEINE 50-325-40 MG PO TABS: 1.0000 | ORAL_TABLET | Freq: Four times a day (QID) | ORAL | Status: DC | PRN

## 2013-05-12 NOTE — Progress Notes (Signed)
Pt having pain and pressure, and some spotting, with contractions every 10-15 minutes. Pt denies any other problems or concerns at this time,

## 2013-05-18 ENCOUNTER — Ambulatory Visit (INDEPENDENT_AMBULATORY_CARE_PROVIDER_SITE_OTHER): Payer: Medicaid Other | Admitting: Obstetrics & Gynecology

## 2013-05-18 ENCOUNTER — Telehealth: Payer: Self-pay | Admitting: Obstetrics and Gynecology

## 2013-05-18 ENCOUNTER — Encounter: Payer: Self-pay | Admitting: Obstetrics & Gynecology

## 2013-05-18 VITALS — BP 100/60 | Wt 286.0 lb

## 2013-05-18 DIAGNOSIS — O99019 Anemia complicating pregnancy, unspecified trimester: Secondary | ICD-10-CM

## 2013-05-18 DIAGNOSIS — Z331 Pregnant state, incidental: Secondary | ICD-10-CM

## 2013-05-18 DIAGNOSIS — O09299 Supervision of pregnancy with other poor reproductive or obstetric history, unspecified trimester: Secondary | ICD-10-CM

## 2013-05-18 DIAGNOSIS — O99340 Other mental disorders complicating pregnancy, unspecified trimester: Secondary | ICD-10-CM

## 2013-05-18 DIAGNOSIS — Z1389 Encounter for screening for other disorder: Secondary | ICD-10-CM

## 2013-05-18 LAB — POCT URINALYSIS DIPSTICK
Blood, UA: NEGATIVE
Ketones, UA: NEGATIVE
Leukocytes, UA: NEGATIVE
Nitrite, UA: NEGATIVE

## 2013-05-18 NOTE — Progress Notes (Signed)
Membranes stripped  BP weight and urine results all reviewed and noted. Patient reports good fetal movement, denies any bleeding and no rupture of membranes symptoms or regular contractions. Patient is without complaints. All questions were answered.  

## 2013-05-18 NOTE — Telephone Encounter (Signed)
Spoke with pt. Having contractions every 6 min. No gush of fluid. + baby movement. Pt to come in and be worked in. [employer]

## 2013-05-19 ENCOUNTER — Encounter (HOSPITAL_COMMUNITY): Payer: Self-pay | Admitting: *Deleted

## 2013-05-19 ENCOUNTER — Inpatient Hospital Stay (HOSPITAL_COMMUNITY)
Admission: AD | Admit: 2013-05-19 | Discharge: 2013-05-21 | DRG: 775 | Disposition: A | Discharge status: Home / Self Care | Payer: Medicaid Other | Source: Ambulatory Visit | Attending: Family Medicine | Admitting: Family Medicine

## 2013-05-19 ENCOUNTER — Encounter: Payer: Medicaid Other | Admitting: Obstetrics & Gynecology

## 2013-05-19 ENCOUNTER — Inpatient Hospital Stay (HOSPITAL_COMMUNITY)
Admission: AD | Admit: 2013-05-19 | Discharge: 2013-05-19 | Disposition: A | Discharge status: Home / Self Care | Payer: Medicaid Other | Source: Ambulatory Visit | Attending: Obstetrics and Gynecology | Admitting: Obstetrics and Gynecology

## 2013-05-19 DIAGNOSIS — O99891 Other specified diseases and conditions complicating pregnancy: Secondary | ICD-10-CM | POA: Insufficient documentation

## 2013-05-19 DIAGNOSIS — Z22330 Carrier of Group B streptococcus: Secondary | ICD-10-CM

## 2013-05-19 DIAGNOSIS — O9989 Other specified diseases and conditions complicating pregnancy, childbirth and the puerperium: Secondary | ICD-10-CM

## 2013-05-19 DIAGNOSIS — O99892 Other specified diseases and conditions complicating childbirth: Secondary | ICD-10-CM | POA: Diagnosis present

## 2013-05-19 DIAGNOSIS — F319 Bipolar disorder, unspecified: Secondary | ICD-10-CM

## 2013-05-19 DIAGNOSIS — O99344 Other mental disorders complicating childbirth: Secondary | ICD-10-CM | POA: Diagnosis present

## 2013-05-19 DIAGNOSIS — O471 False labor at or after 37 completed weeks of gestation: Secondary | ICD-10-CM

## 2013-05-19 DIAGNOSIS — O219 Vomiting of pregnancy, unspecified: Secondary | ICD-10-CM

## 2013-05-19 DIAGNOSIS — O479 False labor, unspecified: Secondary | ICD-10-CM | POA: Insufficient documentation

## 2013-05-19 LAB — CBC
MCH: 31 pg (ref 26.0–34.0)
MCHC: 34 g/dL (ref 30.0–36.0)
MCV: 91 fL (ref 78.0–100.0)
Platelets: 141 10*3/uL — ABNORMAL LOW (ref 150–400)
RBC: 3.78 MIL/uL — ABNORMAL LOW (ref 3.87–5.11)
WBC: 17.5 10*3/uL — ABNORMAL HIGH (ref 4.0–10.5)

## 2013-05-19 MED ORDER — ALBUTEROL SULFATE HFA 108 (90 BASE) MCG/ACT IN AERS: 2.0000 | INHALATION_SPRAY | Freq: Four times a day (QID) | RESPIRATORY_TRACT | Status: DC | PRN

## 2013-05-19 MED ORDER — SENNOSIDES-DOCUSATE SODIUM 8.6-50 MG PO TABS
2.0000 | ORAL_TABLET | ORAL | Status: DC
  Administered 2013-05-19: 2 via ORAL
  Filled 2013-05-19 (×2): qty 2

## 2013-05-19 MED ORDER — ONDANSETRON HCL 4 MG/2ML IJ SOLN: 4.0000 mg | INTRAMUSCULAR | Status: DC | PRN

## 2013-05-19 MED ORDER — IBUPROFEN 600 MG PO TABS
600.0000 mg | ORAL_TABLET | Freq: Four times a day (QID) | ORAL | Status: DC
  Administered 2013-05-19 – 2013-05-21 (×6): 600 mg via ORAL
  Filled 2013-05-19 (×7): qty 1

## 2013-05-19 MED ORDER — SIMETHICONE 80 MG PO CHEW: 80.0000 mg | CHEWABLE_TABLET | ORAL | Status: DC | PRN

## 2013-05-19 MED ORDER — OXYTOCIN 10 UNIT/ML IJ SOLN
10.0000 [IU] | Freq: Once | INTRAMUSCULAR | Status: AC
  Administered 2013-05-19: 10 [IU] via INTRAMUSCULAR

## 2013-05-19 MED ORDER — LANOLIN HYDROUS EX OINT: TOPICAL_OINTMENT | CUTANEOUS | Status: DC | PRN

## 2013-05-19 MED ORDER — OXYCODONE-ACETAMINOPHEN 5-325 MG PO TABS
1.0000 | ORAL_TABLET | ORAL | Status: DC | PRN
  Administered 2013-05-20 – 2013-05-21 (×2): 1 via ORAL
  Filled 2013-05-19 (×2): qty 1

## 2013-05-19 MED ORDER — DIBUCAINE 1 % RE OINT: 1.0000 "application " | TOPICAL_OINTMENT | RECTAL | Status: DC | PRN

## 2013-05-19 MED ORDER — TETANUS-DIPHTH-ACELL PERTUSSIS 5-2.5-18.5 LF-MCG/0.5 IM SUSP
0.5000 mL | Freq: Once | INTRAMUSCULAR | Status: AC
  Administered 2013-05-21: 0.5 mL via INTRAMUSCULAR
  Filled 2013-05-19: qty 0.5

## 2013-05-19 MED ORDER — ZOLPIDEM TARTRATE 5 MG PO TABS: 5.0000 mg | ORAL_TABLET | Freq: Every evening | ORAL | Status: DC | PRN

## 2013-05-19 MED ORDER — WITCH HAZEL-GLYCERIN EX PADS: 1.0000 "application " | MEDICATED_PAD | CUTANEOUS | Status: DC | PRN

## 2013-05-19 MED ORDER — BENZOCAINE-MENTHOL 20-0.5 % EX AERO: 1.0000 "application " | INHALATION_SPRAY | CUTANEOUS | Status: DC | PRN

## 2013-05-19 MED ORDER — PRENATAL MULTIVITAMIN CH
1.0000 | ORAL_TABLET | Freq: Every day | ORAL | Status: DC
  Administered 2013-05-20 – 2013-05-21 (×2): 1 via ORAL
  Filled 2013-05-19 (×2): qty 1

## 2013-05-19 MED ORDER — ONDANSETRON HCL 4 MG PO TABS: 4.0000 mg | ORAL_TABLET | ORAL | Status: DC | PRN

## 2013-05-19 MED ORDER — DIPHENHYDRAMINE HCL 25 MG PO CAPS: 25.0000 mg | ORAL_CAPSULE | Freq: Four times a day (QID) | ORAL | Status: DC | PRN

## 2013-05-19 MED ORDER — ONDANSETRON 4 MG PO TBDP
4.0000 mg | ORAL_TABLET | Freq: Once | ORAL | Status: AC
  Administered 2013-05-19: 4 mg via ORAL
  Filled 2013-05-19: qty 1

## 2013-05-19 MED ORDER — OXYTOCIN 10 UNIT/ML IJ SOLN
INTRAMUSCULAR | Status: AC
  Filled 2013-05-19: qty 1

## 2013-05-19 MED ORDER — NALBUPHINE HCL 10 MG/ML IJ SOLN
10.0000 mg | Freq: Once | INTRAMUSCULAR | Status: AC
  Administered 2013-05-19: 10 mg via INTRAMUSCULAR
  Filled 2013-05-19: qty 1

## 2013-05-19 NOTE — H&P (Signed)
Tina Ross is a 32 y.o. female [phone] with IUP at [redacted]w[redacted]d by 6 wk ultrasound presenting for ROM and contractions. Pt was seen in MAU this morning and sent home with a stable cervical exam. She reports her water broke at 17:55 this evening and immediately her contractions became intense and frequent. Upon arrival to the MAU she is screaming and reporting the need to push. + LOF, + contractions, + VB,  +FM.    She receives her prenatal care at [facility]. She had a normal integrated screen and normal anatomy scan. She had GBS positive urine. She plans to breast and bottle feed. She desires Mirena for postpartum contraception.   Past Medical History: Past Medical History  Diagnosis Date  . Bipolar 1 disorder   . Carpal tunnel syndrome   . Bipolar disorder   . Anxiety   . Depression   . Anemia   . Headache(784.0)   . Bipolar 1 disorder, depressed, moderate 10/04/2012  . Hidradenitis 10/04/2012  . Nausea and vomiting in pregnancy 10/04/2012  . Heart murmur     irregular heartbeat as child-cleared by cardiologist  . Sleep apnea     Does not use CPAP  . Arthritis     knees  . Asthma     rarely uses inhaler- last times used was last week  . Abnormal Pap smear     HPV    Past Surgical History: Past Surgical History  Procedure Laterality Date  . Cholecystectomy    . Brain surgery  2009    Obstetrical History: OB History   Grav Para Term Preterm Abortions TAB SAB Ect Mult Living   4 2 2  1  1   2       Gynecological History: OB History   Grav Para Term Preterm Abortions TAB SAB Ect Mult Living   4 3 3  1  1   3       Social History: History   Social History  . Marital Status: Single    Spouse Name: N/A    Number of Children: N/A  . Years of Education: N/A   Social History Main Topics  . Smoking status: Never Smoker   . Smokeless tobacco: Never Used  . Alcohol Use: No     Comment: Occ; not now  . Drug Use: No  . Sexual Activity: Not Currently    Birth Control/  Protection: None   Other Topics Concern  . Not on file   Social History Narrative  . No narrative on file    Family History: Family History  Problem Relation Age of Onset  . Hypertension Mother   . Heart disease Mother   . Mental illness Mother   . Thyroid disease Mother   . Dementia Mother   . Kidney disease Mother   . Cancer Mother     liver  . Hypertension Father   . Diabetes Father   . Heart disease Father   . Cancer Daughter     Bone  . Heart disease Daughter   . Diabetes Paternal Aunt   . Hypertension Maternal Grandmother   . Hypertension Maternal Grandfather   . Diabetes Maternal Grandfather   . Cancer Maternal Grandfather     Prostate, colon, bladder, lung  . Diabetes Paternal Grandmother   . Stroke Paternal Grandmother   . Diabetes Paternal Grandfather   . Cancer Paternal Grandfather     Bone and lung  . Diabetes Paternal Aunt     Allergies: No  Known Allergies  Prescriptions prior to admission  Medication Sig Dispense Refill  . acetaminophen (TYLENOL) 500 MG tablet Take 500 mg by mouth 2 (two) times daily as needed for pain.       Marland Kitchen albuterol (PROVENTIL HFA;VENTOLIN HFA) 108 (90 BASE) MCG/ACT inhaler Inhale 2 puffs into the lungs every 6 (six) hours as needed for wheezing.  1 Inhaler  2  . butalbital-acetaminophen-caffeine (FIORICET) 50-325-40 MG per tablet Take 1-2 tablets by mouth every 6 (six) hours as needed for headache.  30 tablet  1  . cyclobenzaprine (FLEXERIL) 10 MG tablet Take 1 tablet (10 mg total) by mouth every 8 (eight) hours as needed for muscle spasms.  30 tablet  1  . ferrous sulfate 325 (65 FE) MG tablet Take 325 mg by mouth daily.      Marland Kitchen omeprazole (PRILOSEC) 20 MG capsule Take 1 capsule (20 mg total) by mouth daily.  30 capsule  1  . Prenatal Vit-Fe Fumarate-FA (PRENATAL MULTIVITAMIN) TABS Take 1 tablet by mouth daily at 12 noon.  30 tablet  prn  . promethazine (PHENERGAN) 25 MG tablet Take 1 tablet (25 mg total) by mouth every 6 (six)  hours as needed for nausea.  30 tablet  1     Review of Systems  As per HPI  Blood pressure 129/88, pulse 87, temperature 98.7 F (37.1 C), temperature source Oral, resp. rate 18, last menstrual period 07/19/2012. General appearance: alert and severe distress, screaming Lungs: no increased WOB or respiratory distress Heart: Regular rate Abdomen: soft, non-tender; bowel sounds normal Pelvic: crowning Extremities: no lower extremity edema Presentation: cephalic Fetal monitoring: no tracing was able to be obtained prior to delivery  Dilation: 7 Effacement (%): 100 Station: +1 Exam by:: Sarajane Marek, RNC   Prenatal labs: ABO, Rh: O/POS/-- (03/25 1348) Antibody: NEG (08/14 1020) Rubella:  Non-immune (3/25) RPR: NON REAC (08/14 1020)  HBsAg: NEGATIVE (03/25 1348)  HIV: NON REACTIVE (08/14 1020)  GBS: POSITIVE (10/21 1218)  2 hr Glucola: normal 84/136/105 Genetic screening: normal integrated sceen Anatomy US : normal    Assessment: Tina Ross is a 32 y.o. [phone] with an IUP at [redacted]w[redacted]d presenting for SROM and term active labor. Pt presented to MAU with cervix 7/100/+1. Despite attempts to calm her she demanded to push and precipitously delivered a viable female infant in MAU. Please see delivery summary for further details.   Plan: 1. Admit to Postpartum unit. Routine orders placed.  2. GBS positive. Did not receive any treatment.   3. She plans to breast and bottle feed.  4. She desires Mirena for postpartum contraception.  5. She had a boy and plans to have an outpatient circumcision at [facility].  Hal Neer, MD 05/19/2013, 7:42 PM Evaluation and management procedures were performed by Resident physician under my supervision/collaboration. Chart reviewed, patient examined by me and I agree with management and plan. Present for 2nd stage and delilvery.

## 2013-05-19 NOTE — MAU Provider Note (Signed)
History     CSN: [id number]  Arrival date and time: 05/19/13 7829   None     Chief Complaint  Patient presents with  . Rupture of Membranes   HPI  Tina Ross is a 32 y.o. [phone] at [redacted]w[redacted]d who presents today for evaluation for rupture of membranes. She states that at 0550 she felt a small gush of fluid. She states that she has been having contractions for two weeks, and they are very uncomfortable.   Past Medical History  Diagnosis Date  . Bipolar 1 disorder   . Carpal tunnel syndrome   . Bipolar disorder   . Anxiety   . Depression   . Anemia   . Headache(784.0)   . Bipolar 1 disorder, depressed, moderate 10/04/2012  . Hidradenitis 10/04/2012  . Nausea and vomiting in pregnancy 10/04/2012  . Heart murmur     irregular heartbeat as child-cleared by cardiologist  . Sleep apnea     Does not use CPAP  . Arthritis     knees  . Asthma     rarely uses inhaler- last times used was last week  . Abnormal Pap smear     HPV    Past Surgical History  Procedure Laterality Date  . Cholecystectomy    . Brain surgery  2009    Family History  Problem Relation Age of Onset  . Hypertension Mother   . Heart disease Mother   . Mental illness Mother   . Thyroid disease Mother   . Dementia Mother   . Kidney disease Mother   . Cancer Mother     liver  . Hypertension Father   . Diabetes Father   . Heart disease Father   . Cancer Daughter     Bone  . Heart disease Daughter   . Diabetes Paternal Aunt   . Hypertension Maternal Grandmother   . Hypertension Maternal Grandfather   . Diabetes Maternal Grandfather   . Cancer Maternal Grandfather     Prostate, colon, bladder, lung  . Diabetes Paternal Grandmother   . Stroke Paternal Grandmother   . Diabetes Paternal Grandfather   . Cancer Paternal Grandfather     Bone and lung  . Diabetes Paternal Aunt     History  Substance Use Topics  . Smoking status: Never Smoker   . Smokeless tobacco: Never Used  . Alcohol Use: No      Comment: Occ; not now    Allergies: No Known Allergies  Prescriptions prior to admission  Medication Sig Dispense Refill  . acetaminophen (TYLENOL) 500 MG tablet Take 500 mg by mouth 2 (two) times daily as needed for pain.       Marland Kitchen albuterol (PROVENTIL HFA;VENTOLIN HFA) 108 (90 BASE) MCG/ACT inhaler Inhale 2 puffs into the lungs every 6 (six) hours as needed for wheezing.  1 Inhaler  2  . butalbital-acetaminophen-caffeine (FIORICET) 50-325-40 MG per tablet Take 1-2 tablets by mouth every 6 (six) hours as needed for headache.  30 tablet  1  . cyclobenzaprine (FLEXERIL) 10 MG tablet Take 1 tablet (10 mg total) by mouth every 8 (eight) hours as needed for muscle spasms.  30 tablet  1  . ferrous sulfate 325 (65 FE) MG tablet Take 325 mg by mouth daily.      Marland Kitchen omeprazole (PRILOSEC) 20 MG capsule Take 1 capsule (20 mg total) by mouth daily.  30 capsule  1  . Prenatal Vit-Fe Fumarate-FA (PRENATAL MULTIVITAMIN) TABS Take 1 tablet by mouth daily at  12 noon.  30 tablet  prn  . promethazine (PHENERGAN) 25 MG tablet Take 1 tablet (25 mg total) by mouth every 6 (six) hours as needed for nausea.  30 tablet  1    ROS Physical Exam   Blood pressure 112/59, pulse 117, temperature 98.3 F (36.8 C), temperature source Oral, resp. rate 18, last menstrual period 07/19/2012.  Physical Exam  Nursing note and vitals reviewed. Constitutional: She is oriented to person, place, and time. She appears well-developed and well-nourished. No distress.  Cardiovascular: Normal rate.   Respiratory: Effort normal.  GI: Soft. There is no tenderness.  Genitourinary:   External: no lesion Vagina: mucous seen, no pooling Cervix: no fluid seen with valsalva, 4/thick/ballotable Uterus: AGA    Neurological: She is alert and oriented to person, place, and time.  Skin: Skin is warm and dry.  Psychiatric: She has a normal mood and affect.    MAU Course  Procedures  Results for orders placed in visit on 05/18/13  (from the past 24 hour(s))  POCT URINALYSIS DIPSTICK     Status: None   Collection Time    05/18/13  3:01 PM      Result Value Range   Color, UA       Clarity, UA       Glucose, UA neg     Bilirubin, UA       Ketones, UA neg     Spec Grav, UA       Blood, UA neg     pH, UA       Protein, UA trace     Urobilinogen, UA       Nitrite, UA neg     Leukocytes, UA Negative      0804: Care transferred to J. Rasch, NP  Will continue to monitor for any cervical change prior to DC   Assessment and Plan    Tawnya Crook 05/19/2013, 7:41 AM

## 2013-05-19 NOTE — MAU Note (Signed)
Pt reports Leaking of fluid since 0545

## 2013-05-20 ENCOUNTER — Encounter (HOSPITAL_COMMUNITY): Payer: Self-pay | Admitting: *Deleted

## 2013-05-20 NOTE — Progress Notes (Signed)
Post Partum Day 1 Subjective: up ad lib, voiding and tolerating PO No complaints at this time other than being irritated by being woken up this morning, feels fatigued   Objective: Blood pressure 124/75, pulse 79, temperature 98.2 F (36.8 C), temperature source Oral, resp. rate 18, last menstrual period 07/19/2012, SpO2 97.00%, unknown if currently breastfeeding.  Physical Exam:  General: alert, cooperative and no distress Lochia: appropriate Uterine Fundus: firm Incision: n/a DVT Evaluation: No evidence of DVT seen on physical exam. Mild bilat foot edema   Recent Labs  05/19/13 2144  HGB 11.7*  HCT 34.4*    Assessment/Plan: Pt arrived to MAU agitated and demanding to push despite attempts to calm her, thus she precipitously delivered a viable baby boy there without GBS treatment. Infant will have to stay for 48hrs Plan for discharge tomorrow delivered at 7pm yesterday Mirena for contraception Breasting feeding Plan for outpt circ at FT   LOS: 1 day   Anselm Lis 05/20/2013, 7:36 AM   I have seen and examined this patient and agree with above documentation in the resident's note.   Rulon Abide, M.D. [facility] 05/20/2013 11:34 AM

## 2013-05-20 NOTE — Progress Notes (Signed)
Clinical Social Work Department PSYCHOSOCIAL ASSESSMENT - MATERNAL/CHILD 05/20/2013  Patient:  Tina Ross, Tina Ross  Account Number:  [id number]  Admit Date:  05/19/2013  Marjo Bicker Name:   Tina Ross    Clinical Social Worker:  Franck Vinal, LCSW   Date/Time:  05/20/2013 02:00 PM  Date Referred:  05/19/2013   Referral source  Central Nursery     Referred reason  Depression/Anxiety   Other referral source:    I:  FAMILY / HOME ENVIRONMENT Child's legal guardian:  PARENT  Guardian - Name Guardian - Age Guardian - Address  Tina Ross 32 [address]  [place], [place] [address]  Tina Ross  same as above   Other household support members/support persons Other support:    II  PSYCHOSOCIAL DATA Information Source:  Patient Interview  Event organiser Employment:   Father is employed   Surveyor, quantity resources:  [employer] If [employer]:   Clinical biochemist  WIC   School / Grade:   Maternity Care Coordinator / Child Services Coordination / Early Interventions:  Cultural issues impacting care:    III  STRENGTHS Strengths  Adequate Resources  Home prepared for Child (including basic supplies)  Supportive family/friends   Strength comment:    IV  RISK FACTORS AND CURRENT PROBLEMS Current Problem:       V  SOCIAL WORK ASSESSMENT Acknowledged order for Social Work consult to assess mother's history of bipolar, depression, and anxiety." Mother was receptive to social work intervention.  FOB was present and very attentive to newborn.   Parents are not married but cohabitate.  Mother states that she was diagnosed with bipolar 7 years and was always managed on an outpatient basis.  Informed that she has been off all mental health medications since Sept. 2013 and there has not been Ross significant return in symptoms.  She denies any current symptoms of depression, or anxiety, and stated that she knew where to seek treatment if needed.  She seem comfortable talking about  her history.    She denies any use of alcohol of illicit drug use during pregnancy. Discussed signs/symptoms of PP depression with parents. Provided them with literature and treatment resources if needed.  They report having all the necessary supplies for newborn.  No acute social concerns reported at this time.  Parents informed of social work Surveyor, mining.      VI SOCIAL WORK PLAN Social Work Plan  No Further Intervention Required / No Barriers to Discharge   Type of pt/family education:   If child protective services report - county:   If child protective services report - date:   Information/referral to community resources comment:   Other social work plan:    Tina Ross J, LCSW

## 2013-05-20 NOTE — Lactation Note (Signed)
This note was copied from the chart of Tina Kelty Szafran. Lactation Consultation Note; Initial visit with mom. P3 first time BF. Mom reports that baby was hungry through the night and she wasn't making enough milk so they have given several bottles of formula. Mom has just pumped with manual pump and obtained about 10 cc's. Mom reports that with her other babies her milk did come in but she was on medicine for bipolar and wasn't able to BF. Is not on that medicine at present.Assisted mom with latch and baby nursed well for 5 minutes then off to sleep. Had formula at last feeding.Encouraged frequent feedings to promote a good mlik supply. Discussed cluster feeding and encouraged to feed whenever she sees them, BF brochure given to mom with resources.Encouraged to call for assist prn.   Patient Name: Tina Ross [id number] Date: 05/20/2013 Reason for consult: Initial assessment   Maternal Data Formula Feeding for Exclusion: No Infant to breast within first hour of birth: Yes Does the patient have breastfeeding experience prior to this delivery?: No  Feeding Feeding Type: Breast Fed Length of feed: 5 min  LATCH Score/Interventions Latch: Grasps breast easily, tongue down, lips flanged, rhythmical sucking.  Audible Swallowing: A few with stimulation  Type of Nipple: Everted at rest and after stimulation  Comfort (Breast/Nipple): Soft / non-tender     Hold (Positioning): Assistance needed to correctly position infant at breast and maintain latch. Intervention(s): Breastfeeding basics reviewed;Support Pillows;Position options;Skin to skin  LATCH Score: 8  Lactation Tools Discussed/Used     Consult Status Consult Status: Follow-up Date: 05/21/13 Follow-up type: In-patient    Pamelia Hoit 05/20/2013, 3:16 PM

## 2013-05-21 MED ORDER — IBUPROFEN 600 MG PO TABS: 600.0000 mg | ORAL_TABLET | Freq: Four times a day (QID) | ORAL | Status: DC

## 2013-05-21 MED ORDER — DOCUSATE SODIUM 100 MG PO CAPS: 100.0000 mg | ORAL_CAPSULE | Freq: Two times a day (BID) | ORAL | Status: DC | PRN

## 2013-05-21 MED ORDER — MEASLES, MUMPS & RUBELLA VAC ~~LOC~~ INJ
0.5000 mL | INJECTION | Freq: Once | SUBCUTANEOUS | Status: AC
  Administered 2013-05-21: 0.5 mL via SUBCUTANEOUS
  Filled 2013-05-21: qty 0.5

## 2013-05-21 NOTE — Discharge Summary (Signed)
Attestation of Attending Supervision of Advanced Practitioner (CNM/NP): Evaluation and management procedures were performed by the Advanced Practitioner under my supervision and collaboration. I have reviewed the Advanced Practitioner's note and chart, and I agree with the management and plan.  Yahia Bottger H. 8:48 PM

## 2013-05-21 NOTE — Discharge Summary (Signed)
Obstetric Discharge Summary Reason for Admission: onset of labor Prenatal Procedures: ultrasound Intrapartum Procedures: spontaneous vaginal delivery Postpartum Procedures: none Complications-Operative and Postpartum: none Hemoglobin  Date Value Range Status  05/19/2013 11.7* 12.0 - 15.0 g/dL Final     HCT  Date Value Range Status  05/19/2013 34.4* 36.0 - 46.0 % Final    Physical Exam:  General: alert, cooperative and no distress Lochia: appropriate Uterine Fundus: firm Incision: n/a DVT Evaluation: No evidence of DVT seen on physical exam. Negative Homan's sign. No cords or calf tenderness. No significant calf/ankle edema.  Discharge Diagnoses: Term Pregnancy-delivered  Admission Note: Tina Ross is a 32 y.o. [id number] with an IUP at [redacted]w[redacted]d presenting for SROM and term active labor. Pt presented to MAU with cervix 7/100/+1 then rapidly progressed to 10 cm and precipitously delivered a viable female infant in MAU.   Delivery Note  At 6:35 PM a viable female was delivered via Vaginal, Spontaneous Delivery (Presentation: Right Occiput Anterior). APGAR: 8, 9; weight: pending  Placenta status: Intact, Spontaneous. Cord: 3 vessels with the following complications: None.  Anesthesia: None  Episiotomy: None  Lacerations: None  Suture Repair: n/a  Est. Blood Loss (mL): 150  Mother pushed twice to precipitously deliver viable female infant over intact perineum in MAU. Nuchal cord x 1 which was delivered through then reduced after delivery of body. Shoulders and body delivered without difficulty or delay. Baby vigorous and crying upon delivery and placed on maternal abdomen. Cord clamping delayed until umbilical cord stopped pulsing. Cord then clamped and cut by FOB. Third stage of labor actively managed with gentle cord traction and Oxytocin. Placenta delivered intact with 3 vessel cord. No perineal or vaginal lacerations. Mother and infant stable at conclusion of delivery. Counts correct and  verified. Mom to postpartum. Infant to couplet care/skin to skin.    Discharge Information: Date: 05/21/2013 Activity: pelvic rest Diet: routine Medications: Ibuprofen and Colace Condition: stable Instructions: refer to practice specific booklet Discharge to: home D/C Plan: 1. GBS positive. Did not receive any treatment. Plan D/C at 48 hours from delivery. 2. She plans to breast and bottle feed.  3. She desires Mirena for postpartum contraception.  4. She had a boy and plans to have an outpatient circumcision at [facility].  Newborn Data: Live born female  Birth Weight: 6 lb 12.3 oz (3070 g) APGAR: 8, 9  Home with mother at 48 hours after delivery.  LEFTWICH-KIRBY, Demarko Zeimet 05/21/2013, 12:40 PM

## 2013-05-22 NOTE — Progress Notes (Signed)
Post discharge chart review completed.  

## 2013-05-22 NOTE — H&P (Signed)
Chart reviewed and agree with management and plan.  

## 2013-05-22 NOTE — MAU Provider Note (Signed)
Attestation of Attending Supervision of Advanced Practitioner (CNM/NP): Evaluation and management procedures were performed by the Advanced Practitioner under my supervision and collaboration.  I have reviewed the Advanced Practitioner's note and chart, and I agree with the management and plan.  Latausha Flamm 05/22/2013 1:06 PM

## 2013-05-26 ENCOUNTER — Encounter: Payer: Medicaid Other | Admitting: Obstetrics and Gynecology

## 2013-05-30 ENCOUNTER — Ambulatory Visit (INDEPENDENT_AMBULATORY_CARE_PROVIDER_SITE_OTHER): Payer: Medicaid Other | Admitting: Women's Health

## 2013-05-30 ENCOUNTER — Ambulatory Visit: Payer: Medicaid Other | Admitting: Women's Health

## 2013-05-30 ENCOUNTER — Encounter (INDEPENDENT_AMBULATORY_CARE_PROVIDER_SITE_OTHER): Payer: Self-pay

## 2013-05-30 ENCOUNTER — Encounter: Payer: Self-pay | Admitting: Women's Health

## 2013-05-30 ENCOUNTER — Telehealth: Payer: Self-pay | Admitting: *Deleted

## 2013-05-30 VITALS — BP 110/70 | Ht 64.0 in | Wt 266.0 lb

## 2013-05-30 DIAGNOSIS — F53 Postpartum depression: Secondary | ICD-10-CM

## 2013-05-30 DIAGNOSIS — O99345 Other mental disorders complicating the puerperium: Secondary | ICD-10-CM

## 2013-05-30 DIAGNOSIS — L089 Local infection of the skin and subcutaneous tissue, unspecified: Secondary | ICD-10-CM

## 2013-05-30 MED ORDER — SERTRALINE HCL 50 MG PO TABS: 50.0000 mg | ORAL_TABLET | Freq: Every day | ORAL | Status: DC

## 2013-05-30 MED ORDER — CEPHALEXIN 500 MG PO CAPS: 500.0000 mg | ORAL_CAPSULE | Freq: Four times a day (QID) | ORAL | Status: DC

## 2013-05-30 NOTE — Progress Notes (Signed)
Patient ID: Tina Ross, female   DOB: 10/12/1980, 32 y.o.   MRN: [id number] Tina Ross is a 32 y.o. [id number] female 11 days s/p SVD here b/c had high score on edinburgh ppd scale w/ home health RN. States she was given info to [facility], but hasn't called yet. Pt reports crying all the time, having no support, 'old man' doesn't help me at all- just hollers at me and baby, no appetite, not sleeping well, no joy in things used to, has occ suicidal thoughts, but states she would never act on thoughts and doesn't have a plan. Denies HI/II. Denies physical abuse. Has h/o bipolar depressive disorder- dx by Dr. Milford Cage, was seeing psychiatrist in past but she is now in jail, not on any meds. Was rx'd lexapro during pregnancy by [facility], but she stopped taking it shortly after d/t the way it made her feel.  No h/o PPD. Breastfeeding. Plans for mirena for contraception. Had abnormal pap, needs repeat pap 8wks pp. Also has red pruritic spots on Lt leg x ~1wk, has tried hydrocortisone cream w/o much relief.  Edinburgh today 27  O: BP 110/70  Ht 5\' 4"  (1.626 m)  Wt 266 lb (120.657 kg)  BMI 45.64 kg/m2  LMP 07/19/2012  Breastfeeding? Yes       5 app 3-4cm erythematous circular areas on Lt lower leg w/ scabs in center, warm to touch  A: H/O bipolar depressive disorder     PPD     Skin infection lower Lt leg  P: Rx zoloft 50mg  po daily, knows not to expect improvement in symptoms for at least 3-4wks     Rx keflex 500mg  QID x 10d for skin infection     Referred to [facility]     Pt to contact [facility] to initiate therapy     To notify us/go to ED for increasing suicidal thoughts or if develops plan     F/U 12/22 as scheduled to see how zoloft is working, pp visit, repeat pap, and discuss contraception     Knows to remain abstinent until contraception started  Cheral Marker, CNM, [facility] 05/30/2013

## 2013-05-30 NOTE — Progress Notes (Deleted)
Patient ID: Tina Ross, female   DOB: [birth year], 32 y.o.   MRN: [id number]

## 2013-05-30 NOTE — Telephone Encounter (Signed)
Tina Ross from [facility] Department states pt scored 24 on [place] Depression scale. Pt states has had thoughts of harming self x 3 days ago. Pt has rash on left leg. Informed Clydie Braun to have pt come to office now to be evaluated.

## 2013-05-30 NOTE — Patient Instructions (Signed)

## 2013-06-07 ENCOUNTER — Telehealth: Payer: Self-pay | Admitting: Obstetrics & Gynecology

## 2013-06-07 MED ORDER — FLUCONAZOLE 150 MG PO TABS: 150.0000 mg | ORAL_TABLET | Freq: Once | ORAL | Status: DC

## 2013-06-07 NOTE — Telephone Encounter (Signed)
Spoke with pt. Was given Keflex and now has a yeast infection. Can you order med for this? Uses [facility]. Thanks!!!

## 2013-06-07 NOTE — Telephone Encounter (Signed)
Pt aware Diflucan was e scribed. JSY

## 2013-07-03 ENCOUNTER — Ambulatory Visit: Payer: Medicaid Other | Admitting: Women's Health

## 2013-07-10 ENCOUNTER — Ambulatory Visit: Payer: Medicaid Other | Admitting: Women's Health

## 2013-07-18 ENCOUNTER — Encounter: Payer: Self-pay | Admitting: Women's Health

## 2013-07-18 ENCOUNTER — Ambulatory Visit (INDEPENDENT_AMBULATORY_CARE_PROVIDER_SITE_OTHER): Payer: Medicaid Other | Admitting: Women's Health

## 2013-07-18 ENCOUNTER — Other Ambulatory Visit (HOSPITAL_COMMUNITY)
Admission: RE | Admit: 2013-07-18 | Discharge: 2013-07-18 | Disposition: A | Discharge status: Home / Self Care | Payer: Medicaid Other | Source: Ambulatory Visit | Attending: Obstetrics & Gynecology | Admitting: Obstetrics & Gynecology

## 2013-07-18 ENCOUNTER — Encounter (INDEPENDENT_AMBULATORY_CARE_PROVIDER_SITE_OTHER): Payer: Self-pay

## 2013-07-18 ENCOUNTER — Telehealth: Payer: Self-pay

## 2013-07-18 VITALS — BP 120/70 | Ht 64.0 in | Wt 263.4 lb

## 2013-07-18 DIAGNOSIS — IMO0002 Reserved for concepts with insufficient information to code with codable children: Secondary | ICD-10-CM

## 2013-07-18 DIAGNOSIS — Z1151 Encounter for screening for human papillomavirus (HPV): Secondary | ICD-10-CM | POA: Insufficient documentation

## 2013-07-18 DIAGNOSIS — F418 Other specified anxiety disorders: Secondary | ICD-10-CM

## 2013-07-18 DIAGNOSIS — R87810 Cervical high risk human papillomavirus (HPV) DNA test positive: Secondary | ICD-10-CM | POA: Insufficient documentation

## 2013-07-18 DIAGNOSIS — Z124 Encounter for screening for malignant neoplasm of cervix: Secondary | ICD-10-CM | POA: Insufficient documentation

## 2013-07-18 DIAGNOSIS — Z3202 Encounter for pregnancy test, result negative: Secondary | ICD-10-CM

## 2013-07-18 DIAGNOSIS — Z3480 Encounter for supervision of other normal pregnancy, unspecified trimester: Secondary | ICD-10-CM

## 2013-07-18 LAB — POCT URINE PREGNANCY: Preg Test, Ur: NEGATIVE

## 2013-07-18 MED ORDER — BUSPIRONE HCL 5 MG PO TABS: 5.0000 mg | ORAL_TABLET | Freq: Two times a day (BID) | ORAL | Status: DC

## 2013-07-18 NOTE — Progress Notes (Signed)
Patient ID: Tina Ross, female   DOB: January 30, 1981, 33 y.o.   MRN: [id number] Subjective:    Tina Ross is a 33 y.o. [phone] [id number] Caucasian female who presents for a postpartum visit. She is 8 weeks postpartum following a spontaneous vaginal delivery at 39.0 gestational weeks. Anesthesia: none. I have fully reviewed the prenatal and intrapartum course. Postpartum course has been complicated by depression, currently taking zoloft 50mg  daily. Baby's course has been uncomplicated. Baby is feeding by breast x 5wks, now bottle. Bleeding no bleeding and states she bled x 5d pp and has had no bleeding since. Bowel function is normal. Bladder function is normal. Patient is sexually active. Contraception method is none. Had unprotected sex 1/1. Postpartum depression screening: positive. Score 26 on zoloft 50mg  daily. Score on 05/30/13 ~1wk pp was 27. Reports panic attacks 4-5x/daily. States her heart races, she gets sob, her hands tingle, and she feels like everything begins to close in on her. Denies SI/HI/II. Never got in touch w/ [facility], states she doesn't want to reinitiate care w/ them. Had appt w/ [facility] that she had to cancel d/t not having childcare, they were supposed to call her back to reschedule, but she hasn't heard from them. Reports constant daily bilateral knee pain, had during pregnancy. She has 15 steps in her home that she has to go up and down multiple times daily which aggravates her pain. States she has seen Dr. Hilda LiasKeeling in the past, but it has been awhile and she needs a new referral. She is taking naproxyn that she received from someone else in the past w/o relief. She has lost 24lbs, just began eating healthier, and plans to begin walking in the park w/ her friend next week.  Last pap during pregnancy and was ASCUS w/ +HRPHV, had normal colpo, needs repeat pap today.  The following portions of the patient's history were reviewed and updated as appropriate: allergies, current  medications, past medical history, past surgical history and problem list.  Review of Systems Pertinent items are noted in HPI.   Filed Vitals:   07/18/13 1038  BP: 120/70  Height: 5\' 4"  (1.626 m)  Weight: 263 lb 6.4 oz (119.477 kg)    Objective:     General:  alert, cooperative and no distress   Breasts:  deferred, no complaints  Lungs: clear to auscultation bilaterally  Heart:  regular rate and rhythm  Abdomen: soft, nontender   Vulva: normal  Vagina: normal vagina  Cervix:  Closed, thin prep pap w/ HRHPV cotesting obtained  Corpus: Well-involuted  Adnexa:  Non-palpable  Rectal Exam: No hemorrhoids        Assessment:   Postpartum exam 8 wks s/p SVD Depression screening Depression/Anxiety Contraception counseling  Bilateral knee pain  Plan:  Completed another referral to [facility] Continue zoloft 50mg  daily Rx Buspar 5mg  BID, #60 w/ 3RF Begin exercise routine- plans to begin walking w/ friend, and continue decreasing caloric intake Heating pad, ibuprofen prn for knee pain, will submit referral to Dr. Hilda LiasKeeling: called office, they state she needs [facility] referral from a PCP/Family MD- will notify pt Contraception: desires Mirena, to remain abstinent until placed Follow up in: 1/12 or after for bhcg am and mirena insertion pm, or earlier as needed.   Marge DuncansBooker, Sahej Hauswirth Randall CNM, [facility] 07/18/2013 10:52 AM

## 2013-07-18 NOTE — Telephone Encounter (Signed)
Pt informed per Joellyn HaffKim Booker, will need her PCP to do referral for specialist. Pt verbalized understanding.

## 2013-07-18 NOTE — Patient Instructions (Signed)
No sex until after your Mirena placed  Depression, Adult Depression refers to feeling sad, low, down in the dumps, blue, gloomy, or empty. In general, there are two kinds of depression: 1. Depression that we all experience from time to time because of upsetting life experiences, including the loss of a job or the ending of a relationship (normal sadness or normal grief). This kind of depression is considered normal, is short lived, and resolves within a few days to 2 weeks. (Depression experienced after the loss of a loved one is called bereavement. Bereavement often lasts longer than 2 weeks but normally gets better with time.) 2. Clinical depression, which lasts longer than normal sadness or normal grief or interferes with your ability to function at home, at work, and in school. It also interferes with your personal relationships. It affects almost every aspect of your life. Clinical depression is an illness. Symptoms of depression also can be caused by conditions other than normal sadness and grief or clinical depression. Examples of these conditions are listed as follows:  Physical illness Some physical illnesses, including underactive thyroid gland (hypothyroidism), severe anemia, specific types of cancer, diabetes, uncontrolled seizures, heart and lung problems, strokes, and chronic pain are commonly associated with symptoms of depression.  Side effects of some prescription medicine In some people, certain types of prescription medicine can cause symptoms of depression.  Substance abuse Abuse of alcohol and illicit drugs can cause symptoms of depression. SYMPTOMS Symptoms of normal sadness and normal grief include the following:  Feeling sad or crying for short periods of time.  Not caring about anything (apathy).  Difficulty sleeping or sleeping too much.  No longer able to enjoy the things you used to enjoy.  Desire to be by oneself all the time (social isolation).  Lack of energy  or motivation.  Difficulty concentrating or remembering.  Change in appetite or weight.  Restlessness or agitation. Symptoms of clinical depression include the same symptoms of normal sadness or normal grief and also the following symptoms:  Feeling sad or crying all the time.  Feelings of guilt or worthlessness.  Feelings of hopelessness or helplessness.  Thoughts of suicide or the desire to harm yourself (suicidal ideation).  Loss of touch with reality (psychotic symptoms). Seeing or hearing things that are not real (hallucinations) or having false beliefs about your life or the people around you (delusions and paranoia). DIAGNOSIS  The diagnosis of clinical depression usually is based on the severity and duration of the symptoms. Your caregiver also will ask you questions about your medical history and substance use to find out if physical illness, use of prescription medicine, or substance abuse is causing your depression. Your caregiver also may order blood tests. TREATMENT  Typically, normal sadness and normal grief do not require treatment. However, sometimes antidepressant medicine is prescribed for bereavement to ease the depressive symptoms until they resolve. The treatment for clinical depression depends on the severity of your symptoms but typically includes antidepressant medicine, counseling with a mental health professional, or a combination of both. Your caregiver will help to determine what treatment is best for you. Depression caused by physical illness usually goes away with appropriate medical treatment of the illness. If prescription medicine is causing depression, talk with your caregiver about stopping the medicine, decreasing the dose, or substituting another medicine. Depression caused by abuse of alcohol or illicit drugs abuse goes away with abstinence from these substances. Some adults need professional help in order to stop drinking or using  drugs. SEEK IMMEDIATE  CARE IF:  You have thoughts about hurting yourself or others.  You lose touch with reality (have psychotic symptoms).  You are taking medicine for depression and have a serious side effect. FOR MORE INFORMATION National Alliance on Mental Illness: www.nami.[employer]org National Institute of Mental Health: http://www.maynard.net/www.nimh.nih.gov Document Released: 06/26/2000 Document Revised: 12/29/2011 Document Reviewed: 09/28/2011 [facility] Patient Information 2014 [place]Care, [place].  Generalized Anxiety Disorder Generalized anxiety disorder (GAD) is a mental disorder. It interferes with life functions, including relationships, work, and school. GAD is different from normal anxiety, which everyone experiences at some point in their lives in response to specific life events and activities. Normal anxiety actually helps us prepare for and get through these life events and activities. Normal anxiety goes away after the event or activity is over.  GAD causes anxiety that is not necessarily related to specific events or activities. It also causes excess anxiety in proportion to specific events or activities. The anxiety associated with GAD is also difficult to control. GAD can vary from mild to severe. People with severe GAD can have intense waves of anxiety with physical symptoms (panic attacks).  SYMPTOMS The anxiety and worry associated with GAD are difficult to control. This anxiety and worry are related to many life events and activities and also occur more days than not for 6 months or longer. People with GAD also have three or more of the following symptoms (one or more in children):  Restlessness.   Fatigue.  Difficulty concentrating.   Irritability.  Muscle tension.  Difficulty sleeping or unsatisfying sleep. DIAGNOSIS GAD is diagnosed through an assessment by your caregiver. Your caregiver will ask you questions aboutyour mood,physical symptoms, and events in your life. Your caregiver may ask you about your  medical history and use of alcohol or drugs, including prescription medications. Your caregiver may also do a physical exam and blood tests. Certain medical conditions and the use of certain substances can cause symptoms similar to those associated with GAD. Your caregiver may refer you to a mental health specialist for further evaluation. TREATMENT The following therapies are usually used to treat GAD:   Medication Antidepressant medication usually is prescribed for long-term daily control. Antianxiety medications may be added in severe cases, especially when panic attacks occur.   Talk therapy (psychotherapy) Certain types of talk therapy can be helpful in treating GAD by providing support, education, and guidance. A form of talk therapy called cognitive behavioral therapy can teach you healthy ways to think about and react to daily life events and activities.  Stress managementtechniques These include yoga, meditation, and exercise and can be very helpful when they are practiced regularly. A mental health specialist can help determine which treatment is best for you. Some people see improvement with one therapy. However, other people require a combination of therapies. Document Released: 10/24/2012 Document Reviewed: 10/24/2012 [facility] CityExitCare Patient Information 2014 [place]ExitCare, MarylandLLC.

## 2013-07-18 NOTE — Progress Notes (Deleted)
Patient ID: Tina Ross, female   DOB: 1981/02/28, 33 y.o.   MRN: [id number] Subjective:    Tina GauzeJennifer A Ross is a 33 y.o. [phone] {Race/ethnicity:17218} female who presents for a postpartum visit. She is {1-10:13787} {time; units:18646} postpartum following a {delivery outcome:32078} at *** gestational weeks. Anesthesia: {anesthesia types:812}. I have fully reviewed the prenatal and intrapartum course. Postpartum course has been ***. Baby's course has been ***. Baby is feeding by {breast/bottle:69}. Bleeding {vag bleed:12292}. Bowel function is {normal:32111}. Bladder function is {normal:32111}. Patient {is/is not:9024} sexually active. Contraception method is {contraceptive method:5051}. Postpartum depression screening: {neg default:13464::"negative"}. Score ***.   The following portions of the patient's history were reviewed and updated as appropriate: allergies, current medications, past medical history, past surgical history and problem list.  Review of Systems Pertinent items are noted in HPI.   There were no vitals filed for this visit.  Objective:     General:  alert, cooperative and no distress   Breasts:  deferred, no complaints  Lungs: clear to auscultation bilaterally  Heart:  regular rate and rhythm  Abdomen: soft, nontender   Vulva: normal  Vagina: normal vagina  Cervix:  closed  Corpus: Well-involuted  Adnexa:  Non-palpable  Rectal Exam: *** hemorrhoids        Assessment:   Postpartum exam *** wks s/p *** Depression screening Contraception counseling   Plan:   Contraception: {method:5051} Follow up in: {1-10:13787} {time; units:19136} for *** or as needed.   Marge DuncansBooker, Kimberly Randall 07/18/2013 10:31 AM

## 2013-07-24 ENCOUNTER — Other Ambulatory Visit: Payer: Medicaid Other

## 2013-07-24 ENCOUNTER — Encounter: Payer: Self-pay | Admitting: Women's Health

## 2013-07-24 ENCOUNTER — Ambulatory Visit: Payer: Medicaid Other | Admitting: Women's Health

## 2013-08-01 ENCOUNTER — Telehealth: Payer: Self-pay | Admitting: Women's Health

## 2013-08-01 NOTE — Telephone Encounter (Signed)
Left message for pt to return phone call. Didn't show up for IUD insertion, need to notify her of abnormal pap and need for colpo.   Cheral MarkerKimberly R. Mehran Guderian, CNM, WHNP-BC 08/01/2013 1:05 PM

## 2013-08-01 NOTE — Telephone Encounter (Signed)
Notified pt of abnormal pap needing colpo, switched to front to schedule. States she overslept for IUD insertion. Will reschedule after colpo.   Cheral MarkerKimberly R. Deklyn Trachtenberg, CNM, WHNP-BC 08/01/2013 2:25 PM

## 2013-08-08 ENCOUNTER — Encounter: Payer: Medicaid Other | Admitting: Obstetrics & Gynecology

## 2013-08-14 ENCOUNTER — Ambulatory Visit (INDEPENDENT_AMBULATORY_CARE_PROVIDER_SITE_OTHER): Payer: Medicaid Other | Admitting: Obstetrics & Gynecology

## 2013-08-14 ENCOUNTER — Encounter: Payer: Self-pay | Admitting: Obstetrics & Gynecology

## 2013-08-14 VITALS — BP 120/70 | Wt 270.0 lb

## 2013-08-14 DIAGNOSIS — N879 Dysplasia of cervix uteri, unspecified: Secondary | ICD-10-CM

## 2013-08-14 DIAGNOSIS — R87610 Atypical squamous cells of undetermined significance on cytologic smear of cervix (ASC-US): Secondary | ICD-10-CM

## 2013-08-14 DIAGNOSIS — R87810 Cervical high risk human papillomavirus (HPV) DNA test positive: Secondary | ICD-10-CM

## 2013-08-14 NOTE — Progress Notes (Signed)
Patient ID: Tina GauzeJennifer A Ross, female   DOB: Dec 14, 1980, 33 y.o.   MRN: [id number] Pap  ASCUS +HPV  Colposcopy is adequate No acetowhite epithelial changes No punctation no mosaicism  Follow up pap 1 year  Past Medical History  Diagnosis Date  . Bipolar 1 disorder   . Carpal tunnel syndrome   . Bipolar disorder   . Anxiety   . Depression   . Anemia   . Headache(784.0)   . Bipolar 1 disorder, depressed, moderate 10/04/2012  . Hidradenitis 10/04/2012  . Nausea and vomiting in pregnancy 10/04/2012  . Heart murmur     irregular heartbeat as child-cleared by cardiologist  . Sleep apnea     Does not use CPAP  . Arthritis     knees  . Asthma     rarely uses inhaler- last times used was last week  . Abnormal Pap smear     HPV    Past Surgical History  Procedure Laterality Date  . Cholecystectomy    . Brain surgery  2009    OB History   Grav Para Term Preterm Abortions TAB SAB Ect Mult Living   4 3 3  1  1   3       No Known Allergies  History   Social History  . Marital Status: Single    Spouse Name: N/A    Number of Children: N/A  . Years of Education: N/A   Social History Main Topics  . Smoking status: Never Smoker   . Smokeless tobacco: Never Used  . Alcohol Use: No     Comment: Occ; not now  . Drug Use: No  . Sexual Activity: Yes    Birth Control/ Protection: None   Other Topics Concern  . None   Social History Narrative  . None    Family History  Problem Relation Age of Onset  . Hypertension Mother   . Heart disease Mother   . Mental illness Mother   . Thyroid disease Mother   . Dementia Mother   . Kidney disease Mother   . Cancer Mother     liver  . Hypertension Father   . Diabetes Father   . Heart disease Father   . Cancer Daughter     Bone  . Heart disease Daughter   . Diabetes Paternal Aunt   . Hypertension Maternal Grandmother   . Hypertension Maternal Grandfather   . Diabetes Maternal Grandfather   . Cancer Maternal Grandfather     Prostate, colon, bladder, lung  . Diabetes Paternal Grandmother   . Stroke Paternal Grandmother   . Diabetes Paternal Grandfather   . Cancer Paternal Grandfather     Bone and lung  . Diabetes Paternal Aunt

## 2013-08-22 ENCOUNTER — Ambulatory Visit: Payer: Medicaid Other | Admitting: Women's Health

## 2013-08-30 ENCOUNTER — Ambulatory Visit: Payer: Medicaid Other | Admitting: Women's Health

## 2013-09-05 ENCOUNTER — Ambulatory Visit: Payer: Medicaid Other | Admitting: Advanced Practice Midwife

## 2013-09-06 ENCOUNTER — Ambulatory Visit: Payer: Medicaid Other | Admitting: Advanced Practice Midwife

## 2013-09-12 ENCOUNTER — Ambulatory Visit: Payer: Medicaid Other | Admitting: Women's Health

## 2013-09-18 ENCOUNTER — Emergency Department (HOSPITAL_COMMUNITY)
Admission: EM | Admit: 2013-09-18 | Discharge: 2013-09-18 | Disposition: A | Discharge status: Home / Self Care | Payer: Medicaid Other | Attending: Emergency Medicine | Admitting: Emergency Medicine

## 2013-09-18 ENCOUNTER — Emergency Department (HOSPITAL_COMMUNITY): Payer: Medicaid Other

## 2013-09-18 ENCOUNTER — Encounter (HOSPITAL_COMMUNITY): Payer: Self-pay | Admitting: Emergency Medicine

## 2013-09-18 DIAGNOSIS — IMO0002 Reserved for concepts with insufficient information to code with codable children: Secondary | ICD-10-CM

## 2013-09-18 DIAGNOSIS — J45909 Unspecified asthma, uncomplicated: Secondary | ICD-10-CM | POA: Insufficient documentation

## 2013-09-18 DIAGNOSIS — Z791 Long term (current) use of non-steroidal anti-inflammatories (NSAID): Secondary | ICD-10-CM | POA: Insufficient documentation

## 2013-09-18 DIAGNOSIS — D649 Anemia, unspecified: Secondary | ICD-10-CM | POA: Insufficient documentation

## 2013-09-18 DIAGNOSIS — Z79899 Other long term (current) drug therapy: Secondary | ICD-10-CM | POA: Insufficient documentation

## 2013-09-18 DIAGNOSIS — Z792 Long term (current) use of antibiotics: Secondary | ICD-10-CM | POA: Insufficient documentation

## 2013-09-18 DIAGNOSIS — R011 Cardiac murmur, unspecified: Secondary | ICD-10-CM | POA: Insufficient documentation

## 2013-09-18 DIAGNOSIS — Z8669 Personal history of other diseases of the nervous system and sense organs: Secondary | ICD-10-CM | POA: Insufficient documentation

## 2013-09-18 DIAGNOSIS — F411 Generalized anxiety disorder: Secondary | ICD-10-CM | POA: Insufficient documentation

## 2013-09-18 DIAGNOSIS — Z872 Personal history of diseases of the skin and subcutaneous tissue: Secondary | ICD-10-CM | POA: Insufficient documentation

## 2013-09-18 DIAGNOSIS — F319 Bipolar disorder, unspecified: Secondary | ICD-10-CM | POA: Insufficient documentation

## 2013-09-18 DIAGNOSIS — R079 Chest pain, unspecified: Secondary | ICD-10-CM | POA: Insufficient documentation

## 2013-09-18 DIAGNOSIS — M1710 Unilateral primary osteoarthritis, unspecified knee: Secondary | ICD-10-CM | POA: Insufficient documentation

## 2013-09-18 LAB — CBC WITH DIFFERENTIAL/PLATELET
Basophils Absolute: 0 10*3/uL (ref 0.0–0.1)
Basophils Relative: 0 % (ref 0–1)
EOS PCT: 2 % (ref 0–5)
Eosinophils Absolute: 0.2 10*3/uL (ref 0.0–0.7)
HEMATOCRIT: 38.9 % (ref 36.0–46.0)
Hemoglobin: 12.8 g/dL (ref 12.0–15.0)
LYMPHS ABS: 1.8 10*3/uL (ref 0.7–4.0)
LYMPHS PCT: 23 % (ref 12–46)
MCH: 30 pg (ref 26.0–34.0)
MCHC: 32.9 g/dL (ref 30.0–36.0)
MCV: 91.1 fL (ref 78.0–100.0)
Monocytes Absolute: 0.5 10*3/uL (ref 0.1–1.0)
Monocytes Relative: 6 % (ref 3–12)
Neutro Abs: 5.3 10*3/uL (ref 1.7–7.7)
Neutrophils Relative %: 68 % (ref 43–77)
Platelets: 242 10*3/uL (ref 150–400)
RBC: 4.27 MIL/uL (ref 3.87–5.11)
RDW: 13.9 % (ref 11.5–15.5)
WBC: 7.8 10*3/uL (ref 4.0–10.5)

## 2013-09-18 LAB — HEPATIC FUNCTION PANEL
ALBUMIN: 3.5 g/dL (ref 3.5–5.2)
ALT: 26 U/L (ref 0–35)
AST: 16 U/L (ref 0–37)
Alkaline Phosphatase: 86 U/L (ref 39–117)
Bilirubin, Direct: <0.2 mg/dL (ref 0.0–0.3)
TOTAL PROTEIN: 7.4 g/dL (ref 6.0–8.3)
Total Bilirubin: 0.2 mg/dL — ABNORMAL LOW (ref 0.3–1.2)

## 2013-09-18 LAB — BASIC METABOLIC PANEL
BUN: 10 mg/dL (ref 6–23)
CALCIUM: 9.6 mg/dL (ref 8.4–10.5)
CHLORIDE: 101 meq/L (ref 96–112)
CO2: 28 meq/L (ref 19–32)
Creatinine, Ser: 0.61 mg/dL (ref 0.50–1.10)
GFR calc Af Amer: >90 mL/min (ref >90)
GFR calc non Af Amer: >90 mL/min (ref >90)
GLUCOSE: 90 mg/dL (ref 70–99)
Potassium: 3.8 mEq/L (ref 3.7–5.3)
SODIUM: 141 meq/L (ref 137–147)

## 2013-09-18 LAB — TROPONIN I: Troponin I: <0.3 ng/mL (ref <0.30)

## 2013-09-18 MED ORDER — NAPROXEN 500 MG PO TABS: 500.0000 mg | ORAL_TABLET | Freq: Two times a day (BID) | ORAL | Status: DC

## 2013-09-18 MED ORDER — HYDROCODONE-ACETAMINOPHEN 5-325 MG PO TABS
1.0000 | ORAL_TABLET | Freq: Once | ORAL | Status: AC
  Administered 2013-09-18: 1 via ORAL
  Filled 2013-09-18: qty 1

## 2013-09-18 MED ORDER — TRAMADOL HCL 50 MG PO TABS: 50.0000 mg | ORAL_TABLET | Freq: Four times a day (QID) | ORAL | Status: DC | PRN

## 2013-09-18 NOTE — Discharge Instructions (Signed)
Follow up next week with Dr. Felecia ShellingFanta for recheck

## 2013-09-18 NOTE — ED Notes (Signed)
Patient states she is still having chest pain at this time. RN made aware.

## 2013-09-18 NOTE — ED Notes (Signed)
Pt states left sided CP began at 0100 and woke pt up. Pt states she woke up and took a baby ASA and pain went away, returning at 0600. Pt states pain to left arm also began at 0100. Pt states constant pain dull with pain worsening at times. States significant family hx of heart disease

## 2013-09-18 NOTE — ED Provider Notes (Signed)
CSN: [id number]     Arrival date & time 09/18/13  9629 History  This chart was scribed for Benny Lennert, MD,  by Ashley Jacobs, ED Scribe. The patient was seen in room APA15/APA15 and the patient's care was started at 10:28 AM.   First MD Initiated Contact with Patient 09/18/13 1021     Chief Complaint  Patient presents with  . Chest Pain     (Consider location/radiation/quality/duration/timing/severity/associated sxs/prior Treatment) Patient is a 33 y.o. female presenting with chest pain. The history is provided by the patient and medical records (the pt complains of left side chest pain). No language interpreter was used.  Chest Pain Pain location:  L chest Pain quality: dull and radiating   Pain quality: not burning   Pain radiates to:  L shoulder and L arm Pain radiates to the back: no   Pain severity:  Moderate Onset quality:  Sudden Timing:  Constant Progression:  Unchanged Chronicity:  New Context: breathing   Relieved by:  Nothing Worsened by:  Nothing tried Ineffective treatments:  None tried Associated symptoms: no abdominal pain, no back pain, no cough, no fatigue and no headache   Risk factors: no diabetes mellitus, not female and no smoking    HPI Comments: Tina Ross is a 33 y.o. female who presents to the Emergency Department complaining of constant, moderate left sided chest pain with onset of nine hours ago upon waking. She has tried a Development worker, international aid Asprin which does not relieve the pain. The pain is described as radiating to her left shoulder and down her left arm. Pt also describes the pain as "deep down" and dull. She mentions deep breathing makes the pain worse and nothing seems to relieve the pain. Pt states significant family hx of heart disease. She mentions she has a hx of anxiety attacks but states this is different. Pt has a small infant son and has quit breastfeeding.   Past Medical History  Diagnosis Date  . Bipolar 1 disorder   . Carpal tunnel syndrome    . Bipolar disorder   . Anxiety   . Depression   . Anemia   . Headache(784.0)   . Bipolar 1 disorder, depressed, moderate 10/04/2012  . Hidradenitis 10/04/2012  . Nausea and vomiting in pregnancy 10/04/2012  . Heart murmur     irregular heartbeat as child-cleared by cardiologist  . Sleep apnea     Does not use CPAP  . Arthritis     knees  . Asthma     rarely uses inhaler- last times used was last week  . Abnormal Pap smear     HPV   Past Surgical History  Procedure Laterality Date  . Cholecystectomy    . Brain surgery  2009   Family History  Problem Relation Age of Onset  . Hypertension Mother   . Heart disease Mother   . Mental illness Mother   . Thyroid disease Mother   . Dementia Mother   . Kidney disease Mother   . Cancer Mother     liver  . Hypertension Father   . Diabetes Father   . Heart disease Father   . Cancer Daughter     Bone  . Heart disease Daughter   . Diabetes Paternal Aunt   . Hypertension Maternal Grandmother   . Hypertension Maternal Grandfather   . Diabetes Maternal Grandfather   . Cancer Maternal Grandfather     Prostate, colon, bladder, lung  . Diabetes Paternal Grandmother   .  Stroke Paternal Grandmother   . Diabetes Paternal Grandfather   . Cancer Paternal Grandfather     Bone and lung  . Diabetes Paternal Aunt    History  Substance Use Topics  . Smoking status: Never Smoker   . Smokeless tobacco: Never Used  . Alcohol Use: No     Comment: Occ; not now   OB History   Grav Para Term Preterm Abortions TAB SAB Ect Mult Living   4 3 3  1  1   3      Review of Systems  Constitutional: Negative for appetite change and fatigue.  HENT: Negative for congestion, ear discharge and sinus pressure.   Eyes: Negative for discharge.  Respiratory: Negative for cough.   Cardiovascular: Positive for chest pain.  Gastrointestinal: Negative for abdominal pain and diarrhea.  Genitourinary: Negative for frequency and hematuria.  Musculoskeletal:  Positive for arthralgias and myalgias. Negative for back pain.  Skin: Negative for rash.  Neurological: Negative for seizures and headaches.  Psychiatric/Behavioral: Negative for hallucinations.  All other systems reviewed and are negative.      Allergies  Review of patient's allergies indicates no known allergies.  Home Medications   Current Outpatient Rx  Name  Route  Sig  Dispense  Refill  . albuterol (PROVENTIL HFA;VENTOLIN HFA) 108 (90 BASE) MCG/ACT inhaler   Inhalation   Inhale 2 puffs into the lungs every 6 (six) hours as needed for wheezing.   1 Inhaler   2   . busPIRone (BUSPAR) 5 MG tablet   Oral   Take 1 tablet (5 mg total) by mouth 2 (two) times daily.   60 tablet   3   . butalbital-acetaminophen-caffeine (FIORICET) 50-325-40 MG per tablet   Oral   Take 1-2 tablets by mouth every 6 (six) hours as needed for headache.   30 tablet   1   . cephALEXin (KEFLEX) 500 MG capsule   Oral   Take 1 capsule (500 mg total) by mouth 4 (four) times daily. X 10 days   40 capsule   0   . cyclobenzaprine (FLEXERIL) 10 MG tablet   Oral   Take 1 tablet (10 mg total) by mouth every 8 (eight) hours as needed for muscle spasms.   30 tablet   1   . docusate sodium (COLACE) 100 MG capsule   Oral   Take 1 capsule (100 mg total) by mouth 2 (two) times daily as needed.   30 capsule   2   . fluconazole (DIFLUCAN) 150 MG tablet   Oral   Take 1 tablet (150 mg total) by mouth once. Take the second tablet 3 days after the first one.   2 tablet   0   . ibuprofen (ADVIL,MOTRIN) 600 MG tablet   Oral   Take 1 tablet (600 mg total) by mouth every 6 (six) hours.   30 tablet   0   . IRON PO   Oral   Take by mouth 1 day or 1 dose.         . Prenatal Vit-Fe Fumarate-FA (PRENATAL MULTIVITAMIN) TABS   Oral   Take 1 tablet by mouth daily at 12 noon.   30 tablet   prn   . sertraline (ZOLOFT) 50 MG tablet   Oral   Take 1 tablet (50 mg total) by mouth daily.   30 tablet    6    BP 120/65  Pulse 78  Temp(Src) 98.3 F (36.8 C) (Oral)  Resp 21  Ht 5\' 5"  (1.651 m)  Wt 274 lb (124.286 kg)  BMI 45.60 kg/m2  SpO2 98%  LMP 09/04/2013 Physical Exam  Constitutional: She is oriented to person, place, and time. She appears well-developed.  HENT:  Head: Normocephalic.  Eyes: Conjunctivae and EOM are normal. No scleral icterus.  Neck: Neck supple. No thyromegaly present.  Cardiovascular: Normal rate and regular rhythm.  Exam reveals no gallop and no friction rub.   No murmur heard. Pulmonary/Chest: No stridor. She has no wheezes. She has no rales. She exhibits no tenderness.  Abdominal: She exhibits no distension. There is no tenderness. There is no rebound.  Musculoskeletal: Normal range of motion. She exhibits no edema.  Lymphadenopathy:    She has no cervical adenopathy.  Neurological: She is oriented to person, place, and time. She exhibits normal muscle tone. Coordination normal.  Skin: No rash noted. No erythema.  Psychiatric: She has a normal mood and affect. Her behavior is normal.    ED Course  Procedures (including critical care time) DIAGNOSTIC STUDIES: Oxygen Saturation is 97% on room air, normal by my interpretation.    COORDINATION OF CARE:  10:31 AM Discussed course of care with pt . Pt understands and agrees.   Labs Review Labs Reviewed  HEPATIC FUNCTION PANEL - Abnormal; Notable for the following:    Total Bilirubin 0.2 (*)    All other components within normal limits  CBC WITH DIFFERENTIAL  BASIC METABOLIC PANEL  TROPONIN I   Imaging Review No results found.   EKG Interpretation None      Date: 09/18/2013  Rate: 102  Rhythm: sinus tach  QRS Axis: left axis   Intervals: nl  ST/T Wave abnormalities: nonspecific changes  Conduction Disutrbances:none  Narrative Interpretation:   Old EKG Reviewed: none   MDM   Final diagnoses:  None    The chart was scribed for me under my direct supervision.  I personally  performed the history, physical, and medical decision making and all procedures in the evaluation of this patient.Benny Lennert.     Kasean Denherder L Khai Arrona, MD 09/18/13 [phone]

## 2013-09-27 ENCOUNTER — Ambulatory Visit: Payer: Medicaid Other | Admitting: Advanced Practice Midwife

## 2013-09-28 ENCOUNTER — Ambulatory Visit: Payer: Medicaid Other | Admitting: Advanced Practice Midwife

## 2013-12-08 ENCOUNTER — Other Ambulatory Visit: Payer: Self-pay | Admitting: Radiology

## 2013-12-11 ENCOUNTER — Encounter (HOSPITAL_COMMUNITY): Payer: Self-pay | Admitting: Pharmacy Technician

## 2013-12-21 NOTE — Patient Instructions (Signed)
Your procedure is scheduled on:12/28/2013   Report to [name] at 7:10    AM.  Call this number if you have problems the morning of surgery: [phone]   Remember:   Do not drink or eat food:After Midnight.  :  Take these medicines the morning of surgery with A SIP OF WATER: Use Albuterol inhaler   Do not wear jewelry, make-up or nail polish.  Do not wear lotions, powders, or perfumes. You may wear deodorant.  Do not shave 48 hours prior to surgery. Men may shave face and neck.  Do not bring valuables to the hospital.  Contacts, dentures or bridgework may not be worn into surgery.  Leave suitcase in the car. After surgery it may be brought to your room.  For patients admitted to the hospital, checkout time is 11:00 AM the day of discharge.   Patients discharged the day of surgery will not be allowed to drive home.    Special Instructions: Shower using CHG night before surgery and shower the day of surgery use CHG.  Use special wash - you have one bottle of CHG for all showers.  You should use approximately 1/2 of the bottle for each shower.   Please read over the following fact sheets that you were given: Pain Booklet, MRSA Information, Surgical Site Infection Prevention and Care and Recovery After Surgery  Carpal Tunnel Release (Repair), Care After Refer to this sheet in the next few weeks. These discharge instructions provide you with general information on caring for yourself after you leave the hospital. Your caregiver may also give you specific instructions. Your treatment has been planned according to the most current medical practices available, but unavoidable complications sometimes occur. If you have any problems or questions after discharge, please call your caregiver. HOME CARE INSTRUCTIONS   Have a responsible person with you for 24 hours.  Do not drive a car or take public transportation for 24 hours.  Only take over-the-counter or prescription medicines for pain,  discomfort, or fever as directed by your caregiver. Take them as directed.  You may put ice on the palm side of the affected wrist.  Put ice in a plastic bag.  Place a towel between your skin and the bag.  Leave the ice on for 15-20 minutes, 03-04 times per day.  If you were given a splint to keep your wrist from bending, use it as directed. It is important to wear the splint at night or as directed. Use the splint for as long as you have pain or numbness in your hand, arm, or wrist. This may take 1 to 2 months.  Keep your hand raised (elevated) above the level of your heart as much as possible. This keeps swelling down and helps with discomfort.  Change bandages (dressings) as directed.  Keep the wound clean and dry. SEEK MEDICAL CARE IF:   You develop pain not relieved with medicines.  You develop numbness of your hand.  You develop bleeding from your surgical site.  You have an oral temperature above 102 F (38.9 C).  You develop redness or swelling of the surgical site.  You develop new, unexplained problems. SEEK IMMEDIATE MEDICAL CARE IF:   You develop a rash.  You have difficulty breathing.  You develop any reaction or side effects to medicines given. MAKE SURE YOU:   Understand these instructions.  Will watch your condition.  Will get help right away if you are not doing well or get worse. Document  Released: 01/16/2005 Document Revised: 04/19/2013 Document Reviewed: 05/05/2007 [facility] Center[facility]Care Patient Information 2014 [place], [place]. Monitored Anesthesia Care  Monitored anesthesia care is an anesthesia service for a medical procedure. Anesthesia is the loss of the ability to feel pain. It is produced by medications called anesthetics. It may affect a small area of your body (local anesthesia), a large area of your body (regional anesthesia), or your entire body (general anesthesia). The need for monitored anesthesia care depends your procedure, your condition, and the  potential need for regional or general anesthesia. It is often provided during procedures where:   General anesthesia may be needed if there are complications. This is because you need special care when you are under general anesthesia.   You will be under local or regional anesthesia. This is so that you are able to have higher levels of anesthesia if needed.   You will receive calming medications (sedatives). This is especially the case if sedatives are given to put you in a semi-conscious state of relaxation (deep sedation). This is because the amount of sedative needed to produce this state can be hard to predict. Too much of a sedative can produce general anesthesia. Monitored anesthesia care is performed by one or more caregivers who have special training in all types of anesthesia. You will need to meet with these caregivers before your procedure. During this meeting, they will ask you about your medical history. They will also give you instructions to follow. (For example, you will need to stop eating and drinking before your procedure. You may also need to stop or change medications you are taking.) During your procedure, your caregivers will stay with you. They will:   Watch your condition. This includes watching you blood pressure, breathing, and level of pain.   Diagnose and treat problems that occur.   Give medications if they are needed. These may include calming medications (sedatives) and anesthetics.   Make sure you are comfortable.  Having monitored anesthesia care does not necessarily mean that you will be under anesthesia. It does mean that your caregivers will be able to manage anesthesia if you need it or if it occurs. It also means that you will be able to have a different type of anesthesia than you are having if you need it. When your procedure is complete, your caregivers will continue to watch your condition. They will make sure any medications wear off before you are  allowed to go home.  Document Released: 03/25/2005 Document Revised: 10/24/2012 Document Reviewed: 08/10/2012 [facility]Exit[facility] Patient Information 2014 [place]Care, [place].

## 2013-12-22 ENCOUNTER — Encounter (HOSPITAL_COMMUNITY): Payer: Self-pay

## 2013-12-22 ENCOUNTER — Encounter (HOSPITAL_COMMUNITY)
Admission: RE | Admit: 2013-12-22 | Discharge: 2013-12-22 | Disposition: A | Discharge status: Home / Self Care | Payer: Medicaid Other | Source: Ambulatory Visit | Attending: Orthopaedic Surgery | Admitting: Orthopaedic Surgery

## 2013-12-22 DIAGNOSIS — Z01812 Encounter for preprocedural laboratory examination: Secondary | ICD-10-CM | POA: Insufficient documentation

## 2013-12-22 DIAGNOSIS — Z01818 Encounter for other preprocedural examination: Secondary | ICD-10-CM | POA: Insufficient documentation

## 2013-12-22 HISTORY — DX: Other mental disorders complicating the puerperium: O99.345

## 2013-12-22 HISTORY — DX: Postpartum depression: F53.0

## 2013-12-22 HISTORY — DX: Insomnia, unspecified: G47.00

## 2013-12-22 LAB — COMPREHENSIVE METABOLIC PANEL
ALK PHOS: 81 U/L (ref 39–117)
ALT: 23 U/L (ref 0–35)
AST: 16 U/L (ref 0–37)
Albumin: 3.8 g/dL (ref 3.5–5.2)
BUN: 11 mg/dL (ref 6–23)
CO2: 24 mEq/L (ref 19–32)
CREATININE: 0.63 mg/dL (ref 0.50–1.10)
Calcium: 9.4 mg/dL (ref 8.4–10.5)
Chloride: 103 mEq/L (ref 96–112)
GFR calc non Af Amer: >90 mL/min (ref >90)
GLUCOSE: 100 mg/dL — AB (ref 70–99)
POTASSIUM: 4.1 meq/L (ref 3.7–5.3)
Sodium: 141 mEq/L (ref 137–147)
TOTAL PROTEIN: 7.2 g/dL (ref 6.0–8.3)
Total Bilirubin: 0.4 mg/dL (ref 0.3–1.2)

## 2013-12-22 LAB — CBC WITH DIFFERENTIAL/PLATELET
BASOS PCT: 0 % (ref 0–1)
Basophils Absolute: 0 10*3/uL (ref 0.0–0.1)
Eosinophils Absolute: 0.2 10*3/uL (ref 0.0–0.7)
Eosinophils Relative: 2 % (ref 0–5)
HEMATOCRIT: 39.5 % (ref 36.0–46.0)
HEMOGLOBIN: 13.1 g/dL (ref 12.0–15.0)
LYMPHS ABS: 2.3 10*3/uL (ref 0.7–4.0)
Lymphocytes Relative: 23 % (ref 12–46)
MCH: 30.1 pg (ref 26.0–34.0)
MCHC: 33.2 g/dL (ref 30.0–36.0)
MCV: 90.8 fL (ref 78.0–100.0)
MONO ABS: 0.7 10*3/uL (ref 0.1–1.0)
MONOS PCT: 7 % (ref 3–12)
NEUTROS PCT: 68 % (ref 43–77)
Neutro Abs: 6.8 10*3/uL (ref 1.7–7.7)
Platelets: 238 10*3/uL (ref 150–400)
RBC: 4.35 MIL/uL (ref 3.87–5.11)
RDW: 14 % (ref 11.5–15.5)
WBC: 10 10*3/uL (ref 4.0–10.5)

## 2013-12-22 LAB — URINALYSIS, ROUTINE W REFLEX MICROSCOPIC
Bilirubin Urine: NEGATIVE
Glucose, UA: NEGATIVE mg/dL
Hgb urine dipstick: NEGATIVE
Ketones, ur: NEGATIVE mg/dL
LEUKOCYTES UA: NEGATIVE
NITRITE: NEGATIVE
PROTEIN: NEGATIVE mg/dL
Specific Gravity, Urine: >1.03 — ABNORMAL HIGH (ref 1.005–1.030)
Urobilinogen, UA: 0.2 mg/dL (ref 0.0–1.0)
pH: 6 (ref 5.0–8.0)

## 2013-12-22 LAB — HCG, SERUM, QUALITATIVE: Preg, Serum: NEGATIVE

## 2013-12-22 MED ORDER — CHLORHEXIDINE GLUCONATE 4 % EX LIQD: 60.0000 mL | Freq: Once | CUTANEOUS | Status: DC

## 2013-12-27 NOTE — H&P (Signed)
Tina GauzeJennifer A Ross is an 33 y.o. female.   Chief Complaint: Carpal tunnel left HPI: She has four to six month history of pain and numbness of both hands more at night and more of the left side.  She has seen Dr. Gerilyn Pilgrimoonquah and had EMGs showing bilateral carpal tunnel.  She has tried conservative treatment with no success.  I have seen her in the office and recommended surgical release.  I have gone over risks and imponderables with her for this elective procedure.  She appears to understand and agrees to procedure.  Past Medical History  Diagnosis Date  . Bipolar 1 disorder   . Carpal tunnel syndrome   . Bipolar disorder   . Anxiety   . Depression   . Anemia   . Headache(784.0)   . Bipolar 1 disorder, depressed, moderate 10/04/2012  . Hidradenitis 10/04/2012  . Nausea and vomiting in pregnancy 10/04/2012  . Heart murmur     irregular heartbeat as child-cleared by cardiologist  . Sleep apnea     Does not use CPAP  . Arthritis     knees  . Asthma     rarely uses inhaler- last times used was last week  . Abnormal Pap smear     HPV  . Insomnia   . Post partum depression     Past Surgical History  Procedure Laterality Date  . Cholecystectomy    . Brain surgery  2009    Family History  Problem Relation Age of Onset  . Hypertension Mother   . Heart disease Mother   . Mental illness Mother   . Thyroid disease Mother   . Dementia Mother   . Kidney disease Mother   . Cancer Mother     liver  . Hypertension Father   . Diabetes Father   . Heart disease Father   . Cancer Daughter     Bone  . Heart disease Daughter   . Diabetes Paternal Aunt   . Hypertension Maternal Grandmother   . Hypertension Maternal Grandfather   . Diabetes Maternal Grandfather   . Cancer Maternal Grandfather     Prostate, colon, bladder, lung  . Diabetes Paternal Grandmother   . Stroke Paternal Grandmother   . Diabetes Paternal Grandfather   . Cancer Paternal Grandfather     Bone and lung  . Diabetes  Paternal Aunt    Social History:  reports that she has never smoked. She has never used smokeless tobacco. She reports that she does not drink alcohol or use illicit drugs.  Allergies: No Known Allergies  No prescriptions prior to admission    No results found for this or any previous visit (from the past 48 hour(s)). No results found.  Review of Systems  Musculoskeletal: Positive for myalgias (Pain of both wrist, more on the left.).  Psychiatric/Behavioral: Positive for depression.       Bipolar     There were no vitals taken for this visit. Physical Exam  Constitutional: She is oriented to person, place, and time. She appears well-developed and well-nourished.  HENT:  Head: Normocephalic and atraumatic.  Eyes: Conjunctivae are normal. Pupils are equal, round, and reactive to light.  Neck: Normal range of motion. Neck supple.  Cardiovascular: Normal rate, regular rhythm, normal heart sounds and intact distal pulses.   Respiratory: Effort normal and breath sounds normal.  GI: Soft. Bowel sounds are normal.  Musculoskeletal: She exhibits tenderness (Pain both wrists with decreased sensation median nerve distribution bilaterally, positive tinel and phalen  signs.).  Neurological: She is alert and oriented to person, place, and time. She has normal reflexes.  Skin: Skin is warm and dry.  Psychiatric: She has a normal mood and affect. Her behavior is normal. Judgment and thought content normal.     Assessment/Plan Carpal tunnel syndrome left.  For surgical elective release as outpatient.  KEELING,WAYNE 12/27/2013, 1:52 PM

## 2013-12-28 ENCOUNTER — Encounter (HOSPITAL_COMMUNITY): Payer: Self-pay | Admitting: *Deleted

## 2013-12-28 ENCOUNTER — Encounter (HOSPITAL_COMMUNITY)
Admission: RE | Disposition: A | Discharge status: Home / Self Care | Payer: Self-pay | Source: Ambulatory Visit | Attending: Orthopaedic Surgery

## 2013-12-28 ENCOUNTER — Ambulatory Visit (HOSPITAL_COMMUNITY): Payer: Medicaid Other | Admitting: Anesthesiology

## 2013-12-28 ENCOUNTER — Encounter (HOSPITAL_COMMUNITY): Payer: Medicaid Other | Admitting: Anesthesiology

## 2013-12-28 ENCOUNTER — Ambulatory Visit (HOSPITAL_COMMUNITY)
Admission: RE | Admit: 2013-12-28 | Discharge: 2013-12-28 | Disposition: A | Discharge status: Home / Self Care | Payer: Medicaid Other | Source: Ambulatory Visit | Attending: Orthopaedic Surgery | Admitting: Orthopaedic Surgery

## 2013-12-28 DIAGNOSIS — F411 Generalized anxiety disorder: Secondary | ICD-10-CM | POA: Insufficient documentation

## 2013-12-28 DIAGNOSIS — F319 Bipolar disorder, unspecified: Secondary | ICD-10-CM | POA: Insufficient documentation

## 2013-12-28 DIAGNOSIS — M1710 Unilateral primary osteoarthritis, unspecified knee: Secondary | ICD-10-CM | POA: Insufficient documentation

## 2013-12-28 DIAGNOSIS — G4700 Insomnia, unspecified: Secondary | ICD-10-CM | POA: Insufficient documentation

## 2013-12-28 DIAGNOSIS — G5600 Carpal tunnel syndrome, unspecified upper limb: Secondary | ICD-10-CM | POA: Insufficient documentation

## 2013-12-28 DIAGNOSIS — J45909 Unspecified asthma, uncomplicated: Secondary | ICD-10-CM | POA: Insufficient documentation

## 2013-12-28 DIAGNOSIS — IMO0002 Reserved for concepts with insufficient information to code with codable children: Secondary | ICD-10-CM

## 2013-12-28 DIAGNOSIS — G4730 Sleep apnea, unspecified: Secondary | ICD-10-CM

## 2013-12-28 HISTORY — PX: CARPAL TUNNEL RELEASE: SHX101

## 2013-12-28 SURGERY — CARPAL TUNNEL RELEASE
Anesthesia: Monitor Anesthesia Care | Site: Hand | Laterality: Left

## 2013-12-28 MED ORDER — ONDANSETRON HCL 4 MG/2ML IJ SOLN
4.0000 mg | Freq: Once | INTRAMUSCULAR | Status: AC
  Administered 2013-12-28: 4 mg via INTRAVENOUS

## 2013-12-28 MED ORDER — MIDAZOLAM HCL 5 MG/5ML IJ SOLN
INTRAMUSCULAR | Status: DC | PRN
  Administered 2013-12-28: 2 mg via INTRAVENOUS

## 2013-12-28 MED ORDER — 0.9 % SODIUM CHLORIDE (POUR BTL) OPTIME
TOPICAL | Status: DC | PRN
  Administered 2013-12-28: 1000 mL

## 2013-12-28 MED ORDER — ONDANSETRON HCL 4 MG/2ML IJ SOLN: 4.0000 mg | Freq: Once | INTRAMUSCULAR | Status: DC | PRN

## 2013-12-28 MED ORDER — PROPOFOL 10 MG/ML IV BOLUS
INTRAVENOUS | Status: AC
  Filled 2013-12-28: qty 20

## 2013-12-28 MED ORDER — FENTANYL CITRATE 0.05 MG/ML IJ SOLN
INTRAMUSCULAR | Status: AC
  Filled 2013-12-28: qty 2

## 2013-12-28 MED ORDER — FENTANYL CITRATE 0.05 MG/ML IJ SOLN
INTRAMUSCULAR | Status: DC | PRN
  Administered 2013-12-28 (×2): 25 ug via INTRAVENOUS
  Administered 2013-12-28: 50 ug via INTRAVENOUS

## 2013-12-28 MED ORDER — MIDAZOLAM HCL 2 MG/2ML IJ SOLN
INTRAMUSCULAR | Status: AC
  Filled 2013-12-28: qty 2

## 2013-12-28 MED ORDER — FENTANYL CITRATE 0.05 MG/ML IJ SOLN
25.0000 ug | INTRAMUSCULAR | Status: AC
  Administered 2013-12-28 (×2): 25 ug via INTRAVENOUS

## 2013-12-28 MED ORDER — LACTATED RINGERS IV SOLN
INTRAVENOUS | Status: DC
  Administered 2013-12-28: 08:00:00 via INTRAVENOUS
  Administered 2013-12-28: 500 mL via INTRAVENOUS

## 2013-12-28 MED ORDER — LIDOCAINE HCL (PF) 0.5 % IJ SOLN
INTRAMUSCULAR | Status: DC | PRN
  Administered 2013-12-28: 50 mL via INTRAVENOUS

## 2013-12-28 MED ORDER — SODIUM CHLORIDE 0.9 % IJ SOLN
INTRAMUSCULAR | Status: DC | PRN
  Administered 2013-12-28: .5 mL

## 2013-12-28 MED ORDER — ONDANSETRON HCL 4 MG/2ML IJ SOLN
INTRAMUSCULAR | Status: AC
  Filled 2013-12-28: qty 2

## 2013-12-28 MED ORDER — GLYCOPYRROLATE 0.2 MG/ML IJ SOLN
INTRAMUSCULAR | Status: AC
  Filled 2013-12-28: qty 1

## 2013-12-28 MED ORDER — MIDAZOLAM HCL 2 MG/2ML IJ SOLN
1.0000 mg | INTRAMUSCULAR | Status: DC | PRN
  Administered 2013-12-28: 2 mg via INTRAVENOUS

## 2013-12-28 MED ORDER — FENTANYL CITRATE 0.05 MG/ML IJ SOLN
25.0000 ug | INTRAMUSCULAR | Status: DC | PRN
  Administered 2013-12-28 (×4): 50 ug via INTRAVENOUS

## 2013-12-28 MED ORDER — PROPOFOL INFUSION 10 MG/ML OPTIME
INTRAVENOUS | Status: DC | PRN
  Administered 2013-12-28: 40 ug/kg/min via INTRAVENOUS

## 2013-12-28 MED ORDER — SODIUM CHLORIDE 0.9 % IJ SOLN
INTRAMUSCULAR | Status: AC
  Filled 2013-12-28: qty 10

## 2013-12-28 SURGICAL SUPPLY — 43 items
BAG HAMPER (MISCELLANEOUS) ×3 IMPLANT
BANDAGE ELASTIC 3 VELCRO NS (GAUZE/BANDAGES/DRESSINGS) ×3 IMPLANT
BANDAGE ESMARK 4X12 BL STRL LF (DISPOSABLE) ×1 IMPLANT
BLADE 15 SAFETY STRL DISP (BLADE) ×3 IMPLANT
BNDG CMPR 12X4 ELC STRL LF (DISPOSABLE) ×1
BNDG ESMARK 4X12 BLUE STRL LF (DISPOSABLE) ×3
CLOTH BEACON ORANGE TIMEOUT ST (SAFETY) ×3 IMPLANT
COVER LIGHT HANDLE STERIS (MISCELLANEOUS) ×6 IMPLANT
CUFF TOURNIQUET SINGLE 18IN (TOURNIQUET CUFF) ×3 IMPLANT
DRSG XEROFORM 1X8 (GAUZE/BANDAGES/DRESSINGS) ×2 IMPLANT
DURAPREP 26ML APPLICATOR (WOUND CARE) ×3 IMPLANT
ELECT NDL TIP 2.8 STRL (NEEDLE) IMPLANT
ELECT NEEDLE TIP 2.8 STRL (NEEDLE) IMPLANT
ELECT REM PT RETURN 9FT ADLT (ELECTROSURGICAL) ×3
ELECTRODE REM PT RTRN 9FT ADLT (ELECTROSURGICAL) ×1 IMPLANT
FORMALIN 10 PREFIL 120ML (MISCELLANEOUS) ×3 IMPLANT
GAUZE SPONGE 4X4 12PLY STRL (GAUZE/BANDAGES/DRESSINGS) ×2 IMPLANT
GLOVE BIO SURGEON STRL SZ8 (GLOVE) ×3 IMPLANT
GLOVE BIO SURGEON STRL SZ8.5 (GLOVE) ×5 IMPLANT
GLOVE BIOGEL PI IND STRL 7.0 (GLOVE) IMPLANT
GLOVE BIOGEL PI IND STRL 8.5 (GLOVE) IMPLANT
GLOVE BIOGEL PI INDICATOR 7.0 (GLOVE) ×2
GLOVE BIOGEL PI INDICATOR 8.5 (GLOVE) ×2
GLOVE OPTIFIT SS 6.5 STRL BRWN (GLOVE) ×4 IMPLANT
GOWN STRL REUS W/TWL LRG LVL3 (GOWN DISPOSABLE) ×6 IMPLANT
GOWN STRL REUS W/TWL XL LVL3 (GOWN DISPOSABLE) ×3 IMPLANT
KIT ROOM TURNOVER APOR (KITS) ×3 IMPLANT
NDL HYPO 18GX1.5 BLUNT FILL (NEEDLE) ×1 IMPLANT
NDL HYPO 27GX1-1/4 (NEEDLE) ×1 IMPLANT
NEEDLE HYPO 18GX1.5 BLUNT FILL (NEEDLE) ×3 IMPLANT
NEEDLE HYPO 27GX1-1/4 (NEEDLE) ×3 IMPLANT
NS IRRIG 1000ML POUR BTL (IV SOLUTION) ×3 IMPLANT
PACK BASIC LIMB (CUSTOM PROCEDURE TRAY) ×3 IMPLANT
PAD ARMBOARD 7.5X6 YLW CONV (MISCELLANEOUS) ×3 IMPLANT
PAD CAST 3X4 CTTN HI CHSV (CAST SUPPLIES) ×1 IMPLANT
PADDING CAST COTTON 3X4 STRL (CAST SUPPLIES) ×3
PADDING WEBRIL 3 STERILE (GAUZE/BANDAGES/DRESSINGS) ×2 IMPLANT
SET BASIN LINEN APH (SET/KITS/TRAYS/PACK) ×3 IMPLANT
SPONGE GAUZE 4X4 12PLY (GAUZE/BANDAGES/DRESSINGS) ×2 IMPLANT
SUT ETHILON 3 0 FSL (SUTURE) ×3 IMPLANT
SYR 3ML LL SCALE MARK (SYRINGE) ×3 IMPLANT
TOWEL OR 17X26 4PK STRL BLUE (TOWEL DISPOSABLE) ×3 IMPLANT
VESSEL LOOPS MAXI RED (MISCELLANEOUS) ×3 IMPLANT

## 2013-12-28 NOTE — Anesthesia Preprocedure Evaluation (Signed)
Anesthesia Evaluation  Patient identified by MRN, date of birth, ID band Patient awake    Reviewed: Allergy & Precautions, H&P , NPO status , Patient's Chart, lab work & pertinent test results  Airway Mallampati: II TM Distance: >3 FB Neck ROM: Full    Dental  (+) Teeth Intact   Pulmonary asthma , sleep apnea ,  breath sounds clear to auscultation        Cardiovascular negative cardio ROS  Rhythm:Regular Rate:Normal     Neuro/Psych  Headaches, PSYCHIATRIC DISORDERS Anxiety Depression Bipolar Disorder    GI/Hepatic negative GI ROS,   Endo/Other  Morbid obesity  Renal/GU      Musculoskeletal   Abdominal   Peds  Hematology  (+) anemia ,   Anesthesia Other Findings   Reproductive/Obstetrics                           Anesthesia Physical Anesthesia Plan  ASA: II  Anesthesia Plan: MAC and Bier Block   Post-op Pain Management:    Induction: Intravenous  Airway Management Planned: Nasal Cannula  Additional Equipment:   Intra-op Plan:   Post-operative Plan:   Informed Consent: I have reviewed the patients History and Physical, chart, labs and discussed the procedure including the risks, benefits and alternatives for the proposed anesthesia with the patient or authorized representative who has indicated his/her understanding and acceptance.     Plan Discussed with:   Anesthesia Plan Comments:         Anesthesia Quick Evaluation

## 2013-12-28 NOTE — Progress Notes (Signed)
The History and Physical is unchanged. I have examined the patient. The patient is medically able to have surgery on the left volar wrist . KEELING,WAYNE  

## 2013-12-28 NOTE — Transfer of Care (Signed)
Immediate Anesthesia Transfer of Care Note  Patient: Tina GauzeJennifer A Tat  Procedure(s) Performed: Procedure(s): CARPAL TUNNEL RELEASE (Left)  Patient Location: PACU  Anesthesia Type:MAC  Level of Consciousness: awake, alert  and oriented  Airway & Oxygen Therapy: Patient Spontanous Breathing  Post-op Assessment: Report given to PACU RN  Post vital signs: Reviewed and stable  Complications: No apparent anesthesia complications

## 2013-12-28 NOTE — Brief Op Note (Signed)
12/28/2013  9:15 AM  PATIENT:  Tina Ross  33 y.o. female  PRE-OPERATIVE DIAGNOSIS:  carpal tunnel syndrome left wrist  POST-OPERATIVE DIAGNOSIS:  carpal tunnel syndrome left wrist  PROCEDURE:  Procedure(s): CARPAL TUNNEL RELEASE (Left)  SURGEON:  Surgeon(s) and Role:    * Darreld McleanWayne Keeling, MD - Primary  PHYSICIAN ASSISTANT:   ASSISTANTS: none   ANESTHESIA:   regional  EBL:  Total I/O In: 800 [I.V.:800] Out: 0   BLOOD ADMINISTERED:none  DRAINS: none   LOCAL MEDICATIONS USED:  NONE  SPECIMEN:  Source of Specimen:  left volar carpal ligament  DISPOSITION OF SPECIMEN:  PATHOLOGY  COUNTS:  YES  TOURNIQUET:   Total Tourniquet Time Documented: Upper Arm (Left) - 28 minutes Total: Upper Arm (Left) - 28 minutes   DICTATION: .Other Dictation: Dictation Number [id number]  PLAN OF CARE: Discharge to home after PACU  PATIENT DISPOSITION:  PACU - hemodynamically stable.   Delay start of Pharmacological VTE agent (>24hrs) due to surgical blood loss or risk of bleeding: not applicable

## 2013-12-28 NOTE — Discharge Instructions (Signed)
Keep hand on left dry.  Keep dressing intact.  Do not remove dressing.  Move left fingers often.  Elevate hand.    Keep appointment to see Dr. Hilda LiasKeeling in one week.  If any problems call his office at [phone] or if after hours, the hospital at [phone].  Take pain medicine you already have.

## 2013-12-28 NOTE — Anesthesia Postprocedure Evaluation (Signed)
  Anesthesia Post-op Note  Patient: Tina GauzeJennifer A Coluccio  Procedure(s) Performed: Procedure(s): CARPAL TUNNEL RELEASE (Left)  Patient Location: PACU  Anesthesia Type:MAC  Level of Consciousness: awake, alert  and oriented  Airway and Oxygen Therapy: Patient Spontanous Breathing  Post-op Pain: none  Post-op Assessment: Post-op Vital signs reviewed, Patient's Cardiovascular Status Stable, Respiratory Function Stable, Patent Airway and No signs of Nausea or vomiting  Post-op Vital Signs: Reviewed and stable  Last Vitals:  Filed Vitals:   12/28/13 0804  BP: 107/57  Pulse:   Temp:   Resp: 21    Complications: No apparent anesthesia complications

## 2013-12-28 NOTE — Anesthesia Procedure Notes (Signed)
Anesthesia Regional Block:  Bier block (IV Regional)  Pre-Anesthetic Checklist: ,, timeout performed, Correct Patient, Correct Site, Correct Laterality, Correct Procedure,, site marked, surgical consent,, at surgeon's request Needles:  Injection technique: Single-shot  Needle Type: Other      Needle Gauge: 22 and 22 G    Additional Needles: Bier block (IV Regional)  Nerve Stimulator or Paresthesia:   Additional Responses:  Pulse checked post tourniquet inflation. IV NSL discontinued post injection. Narrative:   Performed by: Personally     

## 2013-12-28 NOTE — Op Note (Signed)
NAMLyndal Ross:  Estabrooks, Kristalynn               ACCOUNT NO.:  [id number]  MEDICAL RECORD NO.:  [id number]  LOCATION:  [facility]                          FACILITY:  [facility]  PHYSICIAN:  J. Darreld McleanWayne Keeling, M.D. DATE OF BIRTH:  1980-09-09  DATE OF PROCEDURE: DATE OF DISCHARGE:                              OPERATIVE REPORT   PREOPERATIVE DIAGNOSIS:  Carpal tunnel syndrome, left.  POSTOPERATIVE DIAGNOSIS:  Carpal tunnel syndrome, left.  PROCEDURE:  Release of volar carpal ligament, saline neurolysis, aponeurotomy, left median nerve.  ANESTHESIA:  Regional Bier block.  TOURNIQUET TIME:  Twenty eight minutes.  DRAINS:  No drains.  SPLINT:  No splint.  SURGEON:  J. Darreld McleanWayne Keeling, M.D.  INDICATIONS:  The patient has had carpal tunnel syndrome in both hands demonstrated by EMGs.  Left hand hurts more than the right.  She has had night pain, no improvement with conservative treatment.  Surgery has been recommended.  Consider the left hand first and then the right hand later.  She understands the risks and imponderables of this elective procedure.  DESCRIPTION OF PROCEDURE:  The patient was seen in the holding area. and the left hand was identified as correct surgical site.  I placed a mark on the left hand.  She was brought to the operating room, placed supine on the operating room table and hand table attached.  Regional Bier block anesthesia was given on the left upper extremity.  She was prepped and draped in the usual fashion.  A time-out identifying the patient., [name], we are doing a left hand for carpal tunnel release.  All instrumentation was properly positioned and working.  The OR team knew each other.  Incision was made.  Careful dissection, median nerve was identified proximally.  Vessel loop placed around the nerve.  A groove directed and placed in the carpal tunnel space and the volar carpal ligament incised.  The nerve was obviously compressed.  Retinaculum cut proximally.  Saline  neurolysis and aponeurotomy carried out.  Nerve was inspected, no apparent injury.  Specimen of the volar carpal ligament was sent to pathology.  The wound was then reapproximated using 3-0 nylon interrupted in vertical mattress manner.  Xeroform was applied, sterile dressing applied, bulky dressing applied.  Sheet cotton applied. Sheet cotton cut dorsally.  ACE bandage applied loosely. The patient tolerated the procedure well, will go to recovery in good condition.  Appropriate analgesic given for pain.  I will see her in my office in 1 week.  If any difficulties, call me through the office or hospital.          ______________________________ J. Darreld McleanWayne Keeling, M.D.     JWK/MEDQ  D:  12/28/2013  T:  12/28/2013  Job:  [id number]

## 2013-12-29 ENCOUNTER — Encounter (HOSPITAL_COMMUNITY): Payer: Self-pay | Admitting: Orthopaedic Surgery

## 2014-01-09 ENCOUNTER — Ambulatory Visit: Payer: Medicaid Other | Admitting: Advanced Practice Midwife

## 2014-01-18 ENCOUNTER — Ambulatory Visit (INDEPENDENT_AMBULATORY_CARE_PROVIDER_SITE_OTHER): Payer: Medicaid Other | Admitting: Advanced Practice Midwife

## 2014-01-18 ENCOUNTER — Encounter: Payer: Self-pay | Admitting: Advanced Practice Midwife

## 2014-01-18 VITALS — BP 110/80 | Ht 65.0 in | Wt 275.4 lb

## 2014-01-18 DIAGNOSIS — Z3202 Encounter for pregnancy test, result negative: Secondary | ICD-10-CM

## 2014-01-18 DIAGNOSIS — Z30430 Encounter for insertion of intrauterine contraceptive device: Secondary | ICD-10-CM

## 2014-01-18 DIAGNOSIS — Z3200 Encounter for pregnancy test, result unknown: Secondary | ICD-10-CM

## 2014-01-18 LAB — POCT URINE PREGNANCY: PREG TEST UR: NEGATIVE

## 2014-01-18 MED ORDER — ZOLPIDEM TARTRATE 5 MG PO TABS: 10.0000 mg | ORAL_TABLET | Freq: Every evening | ORAL | Status: DC | PRN

## 2014-01-18 MED ORDER — ZOLPIDEM TARTRATE 5 MG PO TABS: 5.0000 mg | ORAL_TABLET | Freq: Every evening | ORAL | Status: DC | PRN

## 2014-01-18 NOTE — Progress Notes (Signed)
Tina GauzeJennifer A Ross is a 33 y.o. year old Caucasian female  who presents for placement of a Mirena IUD. Her LMP 6/25/15was and her pregnancy test today is negative. Requests refill on ambien  The risks and benefits of the method and placement have been thouroughly reviewed with the patient and all questions were answered.  Specifically the patient is aware of failure rate of 07/998, expulsion of the IUD and of possible perforation.  The patient is aware of irregular bleeding due to the method and understands the incidence of irregular bleeding diminishes with time.  Time out was performed.  A Graves speculum was placed.  The cervix was prepped using Betadine. The uterus was found to be neutral and it sounded to 8 cm.  The cervix was grasped with a tenaculum and the IUD was inserted to 8 cm.  It was pulled back 1 cm and the IUD was disengaged.  The strings were trimmed to 3 cm.  Sonogram was performed and the proper placement of the IUD was verified.  Meds ordered this encounter  Medications  . DISCONTD: zolpidem (AMBIEN) 5 MG tablet    Sig: Take 2 tablets (10 mg total) by mouth at bedtime as needed for sleep.    Dispense:  15 tablet    Refill:  0    Order Specific Question:  Supervising Provider    Answer:  Despina HiddenEURE, LUTHER H [[id number]]  . zolpidem (AMBIEN) 5 MG tablet    Sig: Take 1 tablet (5 mg total) by mouth at bedtime as needed for sleep.    Dispense:  15 tablet    Refill:  1    Order Specific Question:  Supervising Provider    Answer:  Lazaro ArmsEURE, LUTHER H [[id number]]     The patient was instructed on signs and symptoms of infection and to check for the strings after each menses or each month.  The patient is to refrain from intercourse for 3 days.  The patient is scheduled for a return appointment after her first menses or 4 weeks.  CRESENZO-DISHMAN,Cynthia Cogle 01/18/2014 2:47 PM

## 2014-02-07 ENCOUNTER — Ambulatory Visit (INDEPENDENT_AMBULATORY_CARE_PROVIDER_SITE_OTHER): Payer: Medicaid Other | Admitting: Advanced Practice Midwife

## 2014-02-07 ENCOUNTER — Encounter: Payer: Self-pay | Admitting: Advanced Practice Midwife

## 2014-02-07 VITALS — BP 126/64 | Ht 65.0 in | Wt 272.5 lb

## 2014-02-07 DIAGNOSIS — N764 Abscess of vulva: Secondary | ICD-10-CM

## 2014-02-07 NOTE — Progress Notes (Signed)
[facility] Visit  Patient name: Tina Ross MRN [id number]  Date of birth: [birth year]  CC & HPI:  Tina Ross is a 33 y.o. Caucasian female presenting today for BOil on right labia minora, popped last night.  IUD last month, off and on spotting  Pertinent History Reviewed:  Medical & Surgical Hx:   Past Medical History  Diagnosis Date  . Bipolar 1 disorder   . Carpal tunnel syndrome   . Bipolar disorder   . Anxiety   . Depression   . Anemia   . Headache(784.0)   . Bipolar 1 disorder, depressed, moderate 10/04/2012  . Hidradenitis 10/04/2012  . Nausea and vomiting in pregnancy 10/04/2012  . Heart murmur     irregular heartbeat as child-cleared by cardiologist  . Sleep apnea     Does not use CPAP  . Arthritis     knees  . Asthma     rarely uses inhaler- last times used was last week  . Abnormal Pap smear     HPV  . Insomnia   . Post partum depression    Past Surgical History  Procedure Laterality Date  . Cholecystectomy    . Brain surgery  2009  . Carpal tunnel release Left 12/28/2013    Procedure: CARPAL TUNNEL RELEASE;  Surgeon: Darreld McleanWayne Keeling, MD;  Location: [facility];  Service: Orthopedics;  Laterality: Left;   Medications: Reviewed & Updated - see associated section Social History: Reviewed -  reports that she has never smoked. She has never used smokeless tobacco.  Objective Findings:  Vitals: BP 126/64  Ht 5\' 5"  (1.651 m)  Wt 272 lb 8 oz (123.605 kg)  BMI 45.35 kg/m2  LMP 01/18/2014  Breastfeeding? No  Physical Examination: General appearance - alert, well appearing, and in no distress Mental status - alert, oriented to person, place, and time VULVA:  Healing boil on right labia minora.  Nothing comes out now with squeezing.  Pt states is was nickel sized before it popped  No results found for this or any previous visit (from the past 24 hour(s)).   Assessment & Plan:  A:   Boil, resoliving P:  hibiclense prm boils   F/U  prn  CRESENZO-DISHMAN,Traeton Bordas CNM 02/07/2014 4:26 PM

## 2014-02-15 ENCOUNTER — Ambulatory Visit: Payer: Medicaid Other | Admitting: Advanced Practice Midwife

## 2014-05-10 ENCOUNTER — Other Ambulatory Visit (HOSPITAL_COMMUNITY): Payer: Self-pay | Admitting: Orthopaedic Surgery

## 2014-05-10 DIAGNOSIS — M25561 Pain in right knee: Secondary | ICD-10-CM

## 2014-05-14 ENCOUNTER — Encounter: Payer: Self-pay | Admitting: Advanced Practice Midwife

## 2014-05-23 ENCOUNTER — Ambulatory Visit (HOSPITAL_COMMUNITY): Payer: Medicaid Other

## 2014-05-28 ENCOUNTER — Ambulatory Visit (HOSPITAL_COMMUNITY)
Admission: RE | Admit: 2014-05-28 | Discharge: 2014-05-28 | Disposition: A | Discharge status: Home / Self Care | Payer: Medicaid Other | Source: Ambulatory Visit | Attending: Orthopaedic Surgery | Admitting: Orthopaedic Surgery

## 2014-05-28 DIAGNOSIS — M25561 Pain in right knee: Secondary | ICD-10-CM | POA: Insufficient documentation

## 2014-06-20 ENCOUNTER — Emergency Department (HOSPITAL_COMMUNITY)
Admission: EM | Admit: 2014-06-20 | Discharge: 2014-06-20 | Disposition: A | Discharge status: Home / Self Care | Payer: Medicaid Other | Attending: Emergency Medicine | Admitting: Emergency Medicine

## 2014-06-20 ENCOUNTER — Encounter (HOSPITAL_COMMUNITY): Payer: Self-pay | Admitting: Emergency Medicine

## 2014-06-20 DIAGNOSIS — J45909 Unspecified asthma, uncomplicated: Secondary | ICD-10-CM | POA: Insufficient documentation

## 2014-06-20 DIAGNOSIS — Z8659 Personal history of other mental and behavioral disorders: Secondary | ICD-10-CM | POA: Diagnosis not present

## 2014-06-20 DIAGNOSIS — Z872 Personal history of diseases of the skin and subcutaneous tissue: Secondary | ICD-10-CM | POA: Insufficient documentation

## 2014-06-20 DIAGNOSIS — M1990 Unspecified osteoarthritis, unspecified site: Secondary | ICD-10-CM | POA: Insufficient documentation

## 2014-06-20 DIAGNOSIS — R011 Cardiac murmur, unspecified: Secondary | ICD-10-CM | POA: Insufficient documentation

## 2014-06-20 DIAGNOSIS — J029 Acute pharyngitis, unspecified: Secondary | ICD-10-CM | POA: Diagnosis not present

## 2014-06-20 DIAGNOSIS — G4700 Insomnia, unspecified: Secondary | ICD-10-CM | POA: Insufficient documentation

## 2014-06-20 DIAGNOSIS — Z79899 Other long term (current) drug therapy: Secondary | ICD-10-CM | POA: Diagnosis not present

## 2014-06-20 DIAGNOSIS — D649 Anemia, unspecified: Secondary | ICD-10-CM | POA: Diagnosis not present

## 2014-06-20 DIAGNOSIS — H5711 Ocular pain, right eye: Secondary | ICD-10-CM | POA: Diagnosis present

## 2014-06-20 DIAGNOSIS — H109 Unspecified conjunctivitis: Secondary | ICD-10-CM | POA: Diagnosis not present

## 2014-06-20 MED ORDER — TOBRAMYCIN 0.3 % OP SOLN
1.0000 [drp] | Freq: Once | OPHTHALMIC | Status: AC
  Administered 2014-06-20: 1 [drp] via OPHTHALMIC
  Filled 2014-06-20: qty 5

## 2014-06-20 MED ORDER — TETRACAINE HCL 0.5 % OP SOLN
1.0000 [drp] | Freq: Once | OPHTHALMIC | Status: AC
  Administered 2014-06-20: 1 [drp] via OPHTHALMIC
  Filled 2014-06-20: qty 2

## 2014-06-20 NOTE — ED Notes (Signed)
Pt c/o rt eye swelling and pain.

## 2014-06-20 NOTE — Discharge Instructions (Signed)
Conjunctivitis Conjunctivitis is commonly called "pink eye." Conjunctivitis can be caused by bacterial or viral infection, allergies, or injuries. There is usually redness of the lining of the eye, itching, discomfort, and sometimes discharge. There may be deposits of matter along the eyelids. A viral infection usually causes a watery discharge, while a bacterial infection causes a yellowish, thick discharge. Pink eye is very contagious and spreads by direct contact. You may be given antibiotic eyedrops as part of your treatment. Before using your eye medicine, remove all drainage from the eye by washing gently with warm water and cotton balls. Continue to use the medication until you have awakened 2 mornings in a row without discharge from the eye. Do not rub your eye. This increases the irritation and helps spread infection. Use separate towels from other household members. Wash your hands with soap and water before and after touching your eyes. Use cold compresses to reduce pain and sunglasses to relieve irritation from light. Do not wear contact lenses or wear eye makeup until the infection is gone. SEEK MEDICAL CARE IF:   Your symptoms are not better after 3 days of treatment.  You have increased pain or trouble seeing.  The outer eyelids become very red or swollen. Document Released: 08/06/2004 Document Revised: 09/21/2011 Document Reviewed: 06/29/2005 [facility] Patient Information 2015 [place]. This information is not intended to replace advice given to you by your health care provider. Make sure you discuss any questions you have with your health care provider.   Apply the antibiotic eyedrop to your eyes 4 times daily for the next 7 days.  Avoid rubbing your eyes.  Wash your hands frequently.

## 2014-06-22 NOTE — ED Provider Notes (Signed)
CSN: [id number]     Arrival date & time 06/20/14  1934 History   First MD Initiated Contact with Patient 06/20/14 2103     Chief Complaint  Patient presents with  . Eye Pain     (Consider location/radiation/quality/duration/timing/severity/associated sxs/prior Treatment) The history is provided by the patient.   Tina GauzeJennifer A Ross is a 33 y.o. female presenting with right eye redness, pain and drainage of thick yellow drainage and copious clear tears making her right vision blurry.  She woke this am with her eyelids crusted shut. She denies foreign body sensation. She describes sandpaper quality sensation with blinking.  Her eyelids were swollen this am which improved after ice pack.  She endorses that she has been rubbing her eye a lot.  She does not wear contacts but does wear glasses prn - is supposed to wear for distance but does not wear often. She has found no alleviators for her symptoms.  Has not been in contact with pink eye to her knowledge.     Past Medical History  Diagnosis Date  . Bipolar 1 disorder   . Carpal tunnel syndrome   . Bipolar disorder   . Anxiety   . Depression   . Anemia   . Headache(784.0)   . Bipolar 1 disorder, depressed, moderate 10/04/2012  . Hidradenitis 10/04/2012  . Nausea and vomiting in pregnancy 10/04/2012  . Heart murmur     irregular heartbeat as child-cleared by cardiologist  . Sleep apnea     Does not use CPAP  . Arthritis     knees  . Asthma     rarely uses inhaler- last times used was last week  . Abnormal Pap smear     HPV  . Insomnia   . Post partum depression    Past Surgical History  Procedure Laterality Date  . Cholecystectomy    . Brain surgery  2009  . Carpal tunnel release Left 12/28/2013    Procedure: CARPAL TUNNEL RELEASE;  Surgeon: Darreld McleanWayne Keeling, MD;  Location: [facility];  Service: Orthopedics;  Laterality: Left;   Family History  Problem Relation Age of Onset  . Hypertension Mother   . Heart disease Mother   . Mental  illness Mother   . Thyroid disease Mother   . Dementia Mother   . Kidney disease Mother   . Cancer Mother     liver  . Hypertension Father   . Diabetes Father   . Heart disease Father   . Cancer Daughter     Bone  . Heart disease Daughter   . Diabetes Paternal Aunt   . Hypertension Maternal Grandmother   . Hypertension Maternal Grandfather   . Diabetes Maternal Grandfather   . Cancer Maternal Grandfather     Prostate, colon, bladder, lung  . Diabetes Paternal Grandmother   . Stroke Paternal Grandmother   . Diabetes Paternal Grandfather   . Cancer Paternal Grandfather     Bone and lung  . Diabetes Paternal Aunt    History  Substance Use Topics  . Smoking status: Never Smoker   . Smokeless tobacco: Never Used  . Alcohol Use: No     Comment: Occ; not now   OB History    Gravida Para Term Preterm AB TAB SAB Ectopic Multiple Living   4 3 3  1  1   3      Review of Systems  Constitutional: Negative for fever and chills.  HENT: Positive for rhinorrhea and sore throat. Negative for congestion,  ear pain, sinus pressure, trouble swallowing and voice change.   Eyes: Positive for pain, discharge, redness and visual disturbance. Negative for photophobia.  Respiratory: Negative for cough, shortness of breath, wheezing and stridor.   Cardiovascular: Negative for chest pain.  Gastrointestinal: Negative for abdominal pain.  Genitourinary: Negative.       Allergies  Review of patient's allergies indicates no known allergies.  Home Medications   Prior to Admission medications   Medication Sig Start Date End Date Taking? Authorizing Provider  albuterol (PROVENTIL HFA;VENTOLIN HFA) 108 (90 BASE) MCG/ACT inhaler Inhale 2 puffs into the lungs every 6 (six) hours as needed for wheezing. 03/23/13   Jacklyn ShellFrances Cresenzo-Dishmon, CNM  HYDROcodone-acetaminophen (NORCO) 7.5-325 MG per tablet Take 1 tablet by mouth every 6 (six) hours as needed for moderate pain.    Historical Provider, MD   IRON PO Take 1 tablet by mouth daily.    Historical Provider, MD  zolpidem (AMBIEN) 5 MG tablet Take 1 tablet (5 mg total) by mouth at bedtime as needed for sleep. 01/18/14   Scarlette CalicoFrances Cresenzo-Dishmon, CNM   BP 134/76 mmHg  Pulse 72  Temp(Src) 99.1 F (37.3 C) (Oral)  Resp 20  Ht 5\' 4"  (1.626 m)  Wt 270 lb (122.471 kg)  BMI 46.32 kg/m2  SpO2 100%  LMP 06/20/2014 Physical Exam  Constitutional: She is oriented to person, place, and time. She appears well-developed and well-nourished.  HENT:  Head: Normocephalic and atraumatic.  Right Ear: Tympanic membrane and ear canal normal.  Left Ear: Tympanic membrane and ear canal normal.  Nose: Rhinorrhea present. No mucosal edema.  Mouth/Throat: Uvula is midline, oropharynx is clear and moist and mucous membranes are normal. No oropharyngeal exudate, posterior oropharyngeal edema, posterior oropharyngeal erythema or tonsillar abscesses.  Eyes: Pupils are equal, round, and reactive to light. Lids are everted and swept, no foreign bodies found. Right eye exhibits exudate. Right eye exhibits no chemosis. No foreign body present in the right eye. Right conjunctiva is injected.  Slit lamp exam:      The right eye shows no corneal abrasion, no corneal flare, no corneal ulcer, no foreign body, no hyphema, no hypopyon, no fluorescein uptake and no anterior chamber bulge.  Pain  Relieved with tetracaine tx.  No consensual pain to light. Visual Acuity - Bilateral Near: 20/25 ; Bilateral Distance: 20/25 ; R Near: 20/200 ; R Distance: 20/200 ; L Near: 20/25 ; L Distance: 20/25  Cardiovascular: Normal rate and normal heart sounds.   Pulmonary/Chest: Effort normal. No respiratory distress. She has no wheezes. She has no rales.  Abdominal: Soft. There is no tenderness.  Musculoskeletal: Normal range of motion.  Neurological: She is alert and oriented to person, place, and time.  Skin: Skin is warm and dry. No rash noted.  Psychiatric: She has a normal mood and  affect.    ED Course  Procedures (including critical care time) Labs Review Labs Reviewed - No data to display  Imaging Review No results found.   EKG Interpretation None      MDM   Final diagnoses:  Conjunctivitis of right eye    Exam c/w conjunctivitis. Suspect vision change secondary to eye watering and dc.  She was placed on tobrex, advised warm compresses, avoid rubbing eye, f/u with her pcp for recheck in 2 days if not improving.    Burgess AmorJulie Yvonda Fouty, PA-C 06/22/14 1557  Gilda Creasehristopher J. Pollina, MD 06/26/14 1535

## 2014-06-28 ENCOUNTER — Ambulatory Visit (INDEPENDENT_AMBULATORY_CARE_PROVIDER_SITE_OTHER): Payer: Self-pay | Admitting: Advanced Practice Midwife

## 2014-06-28 ENCOUNTER — Encounter: Payer: Self-pay | Admitting: Advanced Practice Midwife

## 2014-06-28 VITALS — BP 120/72 | Ht 64.0 in | Wt 271.0 lb

## 2014-06-28 DIAGNOSIS — Z113 Encounter for screening for infections with a predominantly sexual mode of transmission: Secondary | ICD-10-CM

## 2014-06-28 DIAGNOSIS — N898 Other specified noninflammatory disorders of vagina: Secondary | ICD-10-CM

## 2014-06-28 MED ORDER — CIPROFLOXACIN HCL 500 MG PO TABS: 500.0000 mg | ORAL_TABLET | Freq: Two times a day (BID) | ORAL | Status: DC

## 2014-06-28 MED ORDER — MEGESTROL ACETATE 40 MG PO TABS
ORAL_TABLET | ORAL | Status: DC
Start: 2014-06-28 — End: 2015-10-15

## 2014-06-28 NOTE — Progress Notes (Signed)
[facility] Visit  Patient name: Tina GauzeJennifer A Allbee MRN [id number]  Date of birth: 01-Nov-1980  CC & HPI:  Tina Ross is a 33 y.o. Caucasian female presenting today for 1 week of cramping and some difficulty "starting my pee".  No sex in 3months. Has had off and on bleeding since getting IUD in July  Pertinent History Reviewed:  Medical & Surgical Hx:   Past Medical History  Diagnosis Date  . Bipolar 1 disorder   . Carpal tunnel syndrome   . Bipolar disorder   . Anxiety   . Depression   . Anemia   . Headache(784.0)   . Bipolar 1 disorder, depressed, moderate 10/04/2012  . Hidradenitis 10/04/2012  . Nausea and vomiting in pregnancy 10/04/2012  . Heart murmur     irregular heartbeat as child-cleared by cardiologist  . Sleep apnea     Does not use CPAP  . Arthritis     knees  . Asthma     rarely uses inhaler- last times used was last week  . Abnormal Pap smear     HPV  . Insomnia   . Post partum depression    Past Surgical History  Procedure Laterality Date  . Cholecystectomy    . Brain surgery  2009  . Carpal tunnel release Left 12/28/2013    Procedure: CARPAL TUNNEL RELEASE;  Surgeon: Darreld McleanWayne Keeling, MD;  Location: [facility];  Service: Orthopedics;  Laterality: Left;   Current outpatient prescriptions: albuterol (PROVENTIL HFA;VENTOLIN HFA) 108 (90 BASE) MCG/ACT inhaler, Inhale 2 puffs into the lungs every 6 (six) hours as needed for wheezing., Disp: 1 Inhaler, Rfl: 2;  HYDROcodone-acetaminophen (NORCO) 7.5-325 MG per tablet, Take 1 tablet by mouth every 6 (six) hours as needed for moderate pain., Disp: , Rfl:  ciprofloxacin (CIPRO) 500 MG tablet, Take 1 tablet (500 mg total) by mouth 2 (two) times daily., Disp: 14 tablet, Rfl: 0;  IRON PO, Take 1 tablet by mouth daily., Disp: , Rfl: ;  zolpidem (AMBIEN) 5 MG tablet, Take 1 tablet (5 mg total) by mouth at bedtime as needed for sleep. (Patient not taking: Reported on 06/28/2014), Disp: 15 tablet, Rfl: 1 Social History:  Reviewed -  reports that she has never smoked. She has never used smokeless tobacco.  Objective Findings:  Vitals: BP 120/72 mmHg  Ht 5\' 4"  (1.626 m)  Wt 271 lb (122.925 kg)  BMI 46.49 kg/m2  LMP 06/20/2014  Physical Examination: General appearance - alert, well appearing, and in no distress Abdomen - soft, nontender, nondistended, no masses or organomegaly Pelvic - SSE:  Old brown blood.  Cx non friable.  Wet prep some WBC, no trich, clue, or yeast.   Urine 3+ blood, 2+ leuks.  No CMT Bedsice US shows IUD at fundus No results found for this or any previous visit (from the past 24 hour(s)).   Assessment & Plan:  A:   ? Pelvic/bladder infection P:  Cipro 500mg  BID X7 Megace algohrythm   CRESENZO-DISHMAN,Matalyn Nawaz CNM 06/28/2014 3:51 PM

## 2014-06-29 LAB — GC/CHLAMYDIA PROBE AMP
CT PROBE, AMP APTIMA: NEGATIVE
GC PROBE AMP APTIMA: NEGATIVE

## 2014-10-03 ENCOUNTER — Ambulatory Visit: Payer: Medicaid Other | Admitting: Advanced Practice Midwife

## 2014-10-17 ENCOUNTER — Ambulatory Visit: Payer: Medicaid Other | Admitting: Advanced Practice Midwife

## 2014-10-23 ENCOUNTER — Ambulatory Visit: Payer: Medicaid Other | Admitting: Advanced Practice Midwife

## 2015-07-13 IMAGING — CR DG CHEST 2V
2 series · 2 of 2 positions shown · non-contrast
Comparison: 08/09/2006

CLINICAL DATA: Left upper chest pain.

EXAM:
CHEST  2 VIEW

[view not recorded (1 of 2)]
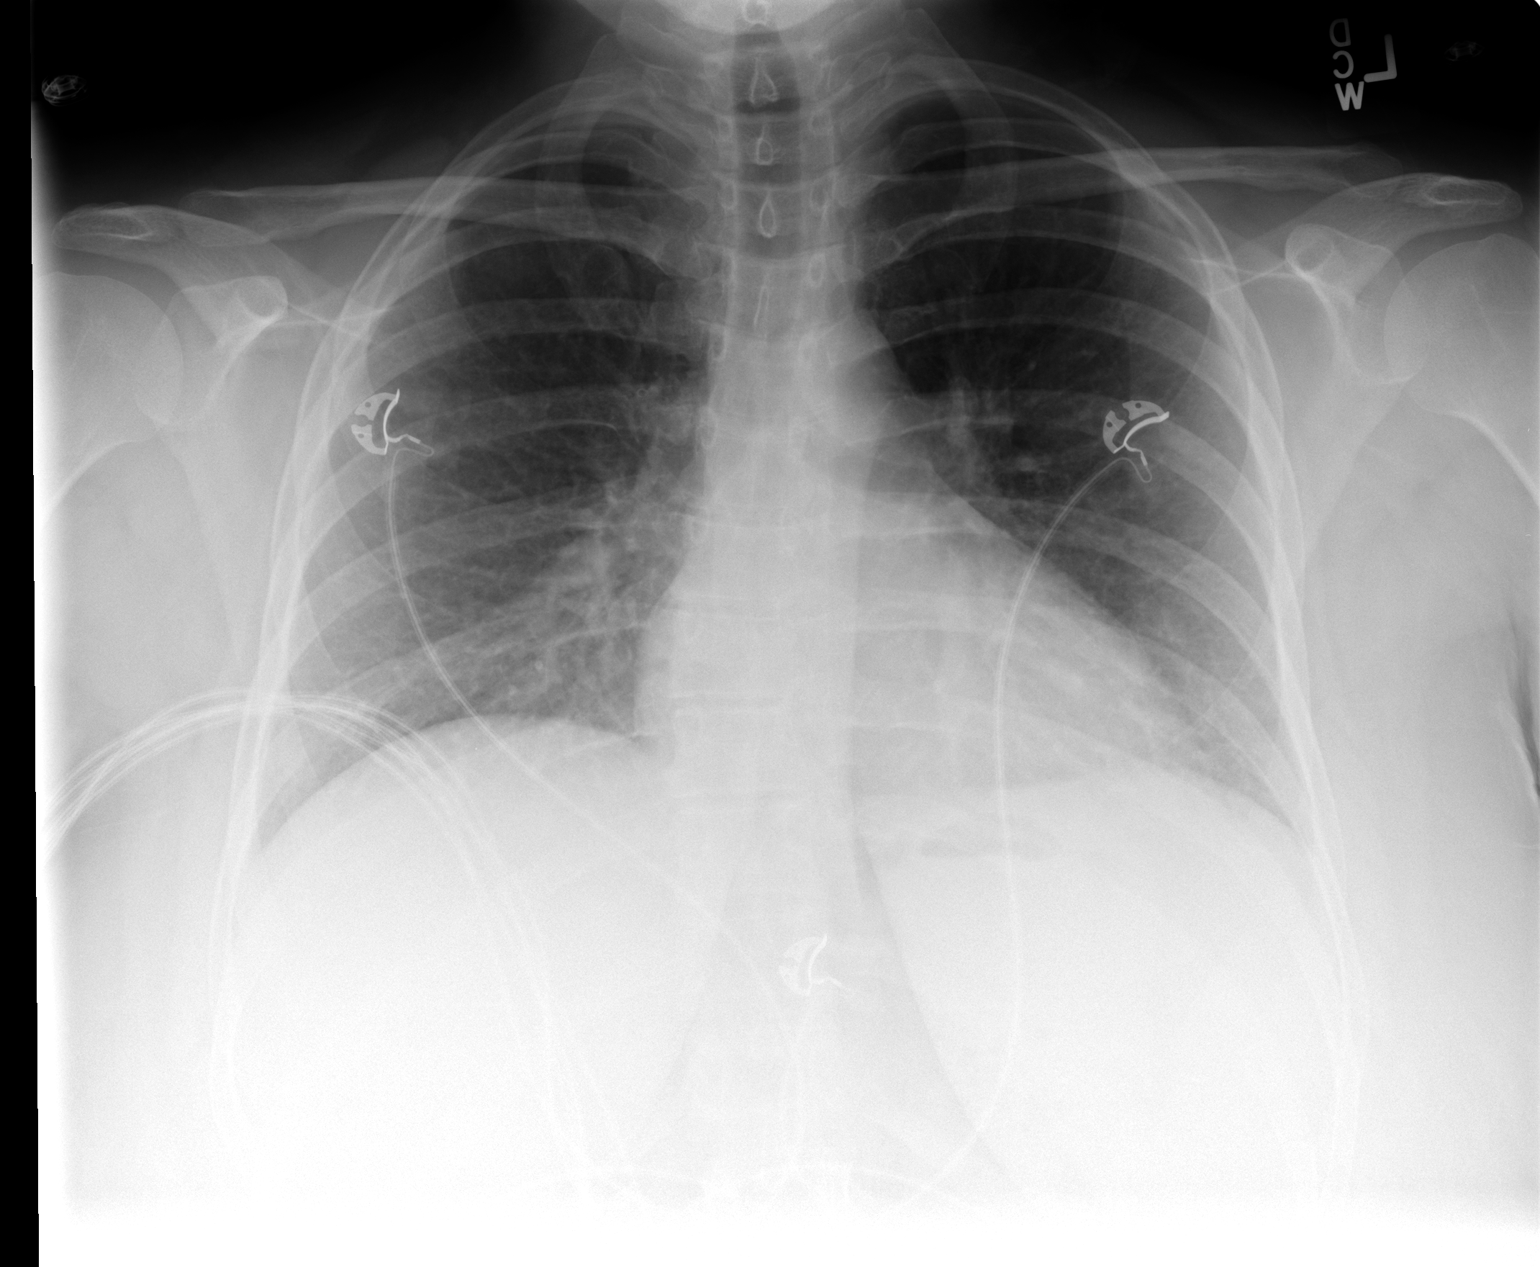

[view not recorded (2 of 2)]
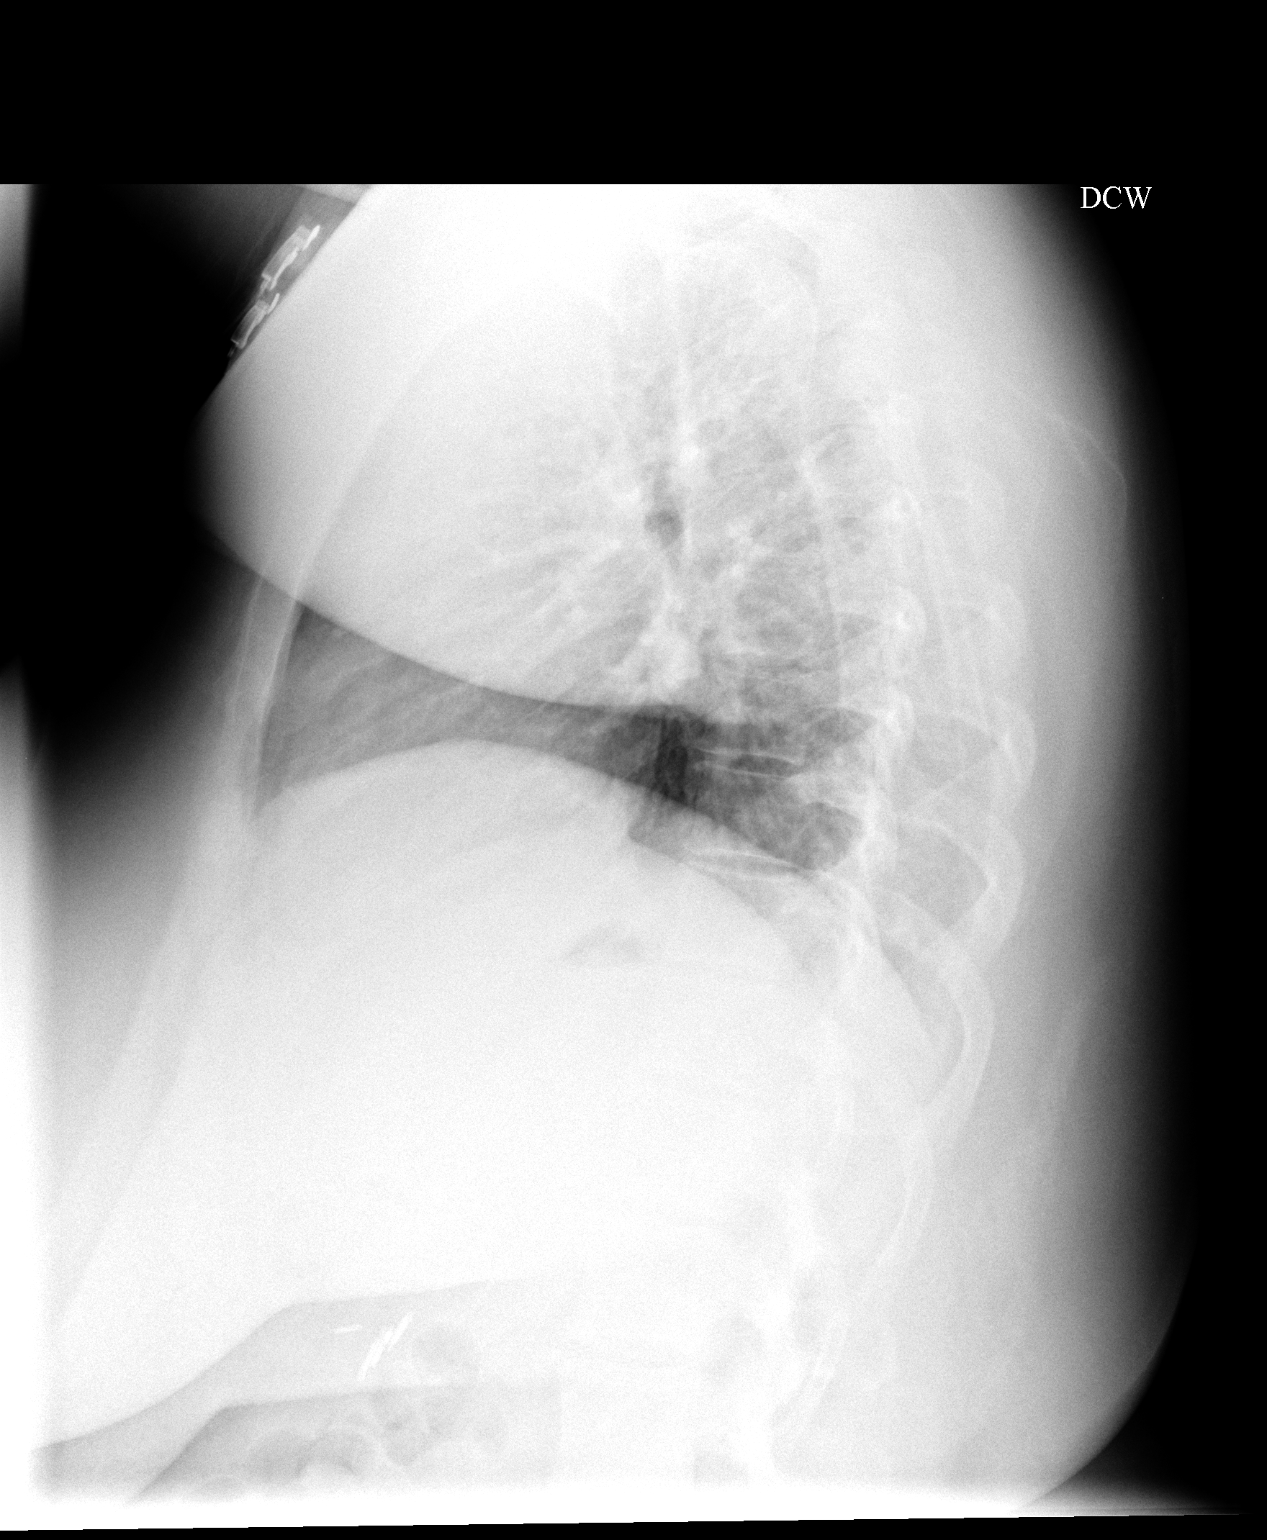

[2 of 2 positions shown; findings below may reference images not displayed]

FINDINGS: The heart size and mediastinal contours are within normal limits.
Both lungs are clear. The visualized skeletal structures are
unremarkable.
IMPRESSION: No active cardiopulmonary disease.

## 2015-08-14 ENCOUNTER — Telehealth: Payer: Self-pay | Admitting: *Deleted

## 2015-08-14 ENCOUNTER — Telehealth: Payer: Self-pay | Admitting: Orthopaedic Surgery

## 2015-08-14 NOTE — Telephone Encounter (Signed)
Routing to Dr Keeling for review 

## 2015-08-14 NOTE — Telephone Encounter (Signed)
Patient called requesting Norco to be refilled. Please advise [phone]

## 2015-08-15 MED ORDER — HYDROCODONE-ACETAMINOPHEN 7.5-325 MG PO TABS: 1.0000 | ORAL_TABLET | ORAL | Status: DC | PRN

## 2015-08-15 NOTE — Telephone Encounter (Signed)
Rx printed

## 2015-09-12 ENCOUNTER — Telehealth: Payer: Self-pay | Admitting: Orthopaedic Surgery

## 2015-09-12 MED ORDER — HYDROCODONE-ACETAMINOPHEN 7.5-325 MG PO TABS: 1.0000 | ORAL_TABLET | ORAL | Status: DC | PRN

## 2015-09-12 NOTE — Telephone Encounter (Signed)
Patient requesting refill of Norco 7.5/325mg  Qty 120 Tablets

## 2015-09-12 NOTE — Telephone Encounter (Signed)
Rx printed

## 2015-10-15 ENCOUNTER — Ambulatory Visit (INDEPENDENT_AMBULATORY_CARE_PROVIDER_SITE_OTHER): Payer: Medicaid Other | Admitting: Orthopaedic Surgery

## 2015-10-15 ENCOUNTER — Encounter: Payer: Self-pay | Admitting: Orthopaedic Surgery

## 2015-10-15 ENCOUNTER — Other Ambulatory Visit: Payer: Self-pay | Admitting: Orthopaedic Surgery

## 2015-10-15 VITALS — BP 121/80 | HR 90 | Temp 97.9°F | Resp 16 | Ht 64.0 in | Wt 232.0 lb

## 2015-10-15 DIAGNOSIS — G8929 Other chronic pain: Secondary | ICD-10-CM

## 2015-10-15 DIAGNOSIS — Z7189 Other specified counseling: Secondary | ICD-10-CM | POA: Diagnosis not present

## 2015-10-15 DIAGNOSIS — M545 Low back pain, unspecified: Secondary | ICD-10-CM

## 2015-10-15 MED ORDER — HYDROCODONE-ACETAMINOPHEN 7.5-325 MG PO TABS: 1.0000 | ORAL_TABLET | ORAL | Status: DC | PRN

## 2015-10-15 NOTE — Progress Notes (Signed)
Patient BJ:YNWGNFAO:Tina Ross, female DOB:[birth year], 35 y.o. ZHY:[id number]  Chief Complaint  Patient presents with  . Follow-up    follow up knee pain    HPI  Tina Ross is a 35 y.o. female who has chronic knee pain and chronic lower back pain.  She has no new trauma.  She has no paresthesias.  Her back is bothering her more.   Back Pain The current episode started more than 1 year ago. The problem occurs intermittently. The problem has been waxing and waning since onset. The pain is present in the lumbar spine. The pain does not radiate. The pain is at a severity of 3/10. The pain is mild. The pain is worse during the day. The symptoms are aggravated by twisting and stress. Pertinent negatives include no chest pain. She has tried analgesics, heat, walking and NSAIDs for the symptoms. The treatment provided moderate relief.    Body mass index is 39.8 kg/(m^2).  Review of Systems  HENT: Negative for congestion.   Respiratory: Negative for cough and shortness of breath.   Cardiovascular: Negative for chest pain and leg swelling.  Endocrine: Negative for cold intolerance.  Musculoskeletal: Positive for myalgias, back pain and arthralgias.  Allergic/Immunologic: Negative for environmental allergies.    Past Medical History  Diagnosis Date  . Bipolar 1 disorder (HCC)   . Carpal tunnel syndrome   . Bipolar disorder (HCC)   . Anxiety   . Depression   . Anemia   . Headache(784.0)   . Bipolar 1 disorder, depressed, moderate (HCC) 10/04/2012  . Hidradenitis 10/04/2012  . Nausea and vomiting in pregnancy 10/04/2012  . Heart murmur     irregular heartbeat as child-cleared by cardiologist  . Sleep apnea     Does not use CPAP  . Arthritis     knees  . Asthma     rarely uses inhaler- last times used was last week  . Abnormal Pap smear     HPV  . Insomnia   . Post partum depression     Past Surgical History  Procedure Laterality Date  . Cholecystectomy    . Brain surgery  2009   . Carpal tunnel release Left 12/28/2013    Procedure: CARPAL TUNNEL RELEASE;  Surgeon: Darreld McleanWayne Aubreanna Percle, MD;  Location: [facility];  Service: Orthopedics;  Laterality: Left;    Family History  Problem Relation Age of Onset  . Hypertension Mother   . Heart disease Mother   . Mental illness Mother   . Thyroid disease Mother   . Dementia Mother   . Kidney disease Mother   . Cancer Mother     liver  . Hypertension Father   . Diabetes Father   . Heart disease Father   . Cancer Daughter     Bone  . Heart disease Daughter   . Diabetes Paternal Aunt   . Hypertension Maternal Grandmother   . Hypertension Maternal Grandfather   . Diabetes Maternal Grandfather   . Cancer Maternal Grandfather     Prostate, colon, bladder, lung  . Diabetes Paternal Grandmother   . Stroke Paternal Grandmother   . Diabetes Paternal Grandfather   . Cancer Paternal Grandfather     Bone and lung  . Diabetes Paternal Aunt     Social History Social History  Substance Use Topics  . Smoking status: Never Smoker   . Smokeless tobacco: Never Used  . Alcohol Use: No     Comment: Occ; not now    No Known  Allergies  Current Outpatient Prescriptions  Medication Sig Dispense Refill  . albuterol (PROVENTIL HFA;VENTOLIN HFA) 108 (90 BASE) MCG/ACT inhaler Inhale 2 puffs into the lungs every 6 (six) hours as needed for wheezing. 1 Inhaler 2  . HYDROcodone-acetaminophen (NORCO) 7.5-325 MG tablet Take 1 tablet by mouth every 4 (four) hours as needed for moderate pain (Must last 30 days.  Do not drive a car or operate machinery while on this medicine.). 120 tablet 0  . IRON PO Take 1 tablet by mouth daily.    Marland Kitchen lurasidone (LATUDA) 20 MG TABS tablet Take by mouth.     No current facility-administered medications for this visit.     Physical Exam  Blood pressure 121/80, pulse 90, temperature 97.9 F (36.6 C), resp. rate 16, height  (1.626 m), weight 232 lb (105.235 kg).  Constitutional: overall normal hygiene,  normal nutrition, well developed, normal grooming, normal body habitus. Assistive device:none  Musculoskeletal: gait and station Limp none, muscle tone and strength are normal, no tremors or atrophy is present.  .  Neurological: coordination overall normal.  Deep tendon reflex/nerve stretch intact.  Sensation normal.  Cranial nerves II-XII intact.   Skin:   normal overall no scars, lesions, ulcers or rashes. No psoriasis.  Psychiatric: Alert and oriented x 3.  Recent memory intact, remote memory unclear.  Normal mood and affect. Well groomed.  Good eye contact.  Cardiovascular: overall no swelling, no varicosities, no edema bilaterally, normal temperatures of the legs and arms, no clubbing, cyanosis and good capillary refill.  Lymphatic: palpation is normal.  Spine/Pelvis examination:  Inspection:  Overall, sacoiliac joint benign and hips nontender; without crepitus or defects.   Thoracic spine inspection: Alignment normal without kyphosis present   Lumbar spine inspection:  Alignment  with normal lumbar lordosis, without scoliosis apparent.   Thoracic spine palpation:  without tenderness of spinal processes   Lumbar spine palpation: with tenderness of lumbar area; without tightness of lumbar muscles    Range of Motion:   Lumbar flexion, forward flexion is 35  without pain or tenderness    Lumbar extension is 10  without pain or tenderness   Left lateral bend is Normal  without pain or tenderness   Right lateral bend is Normal without pain or tenderness   Straight leg raising is Normal   Strength & tone: Normal   Stability overall normal stability   The patient has been educated about the nature of the problem(s) and counseled on treatment options.  The patient appeared to understand what I have discussed and is in agreement with it.  Encounter Diagnoses  Name Primary?  . Midline low back pain without sciatica   . Encounter for chronic pain management Yes    PLAN Call if  any problems.  Precautions discussed.  Continue current medications.   Return to clinic 3 months

## 2015-10-16 LAB — DRUG SCREEN, URINE
Amphetamine Screen, Ur: NEGATIVE
BARBITURATE QUANT UR: NEGATIVE
Benzodiazepines.: NEGATIVE
Cocaine Metabolites: NEGATIVE
Creatinine,U: 97.95 mg/dL
MARIJUANA METABOLITE: NEGATIVE
Methadone: NEGATIVE
Opiates: POSITIVE — AB
PHENCYCLIDINE (PCP): NEGATIVE
Propoxyphene: NEGATIVE

## 2015-11-13 ENCOUNTER — Telehealth: Payer: Self-pay | Admitting: Orthopaedic Surgery

## 2015-11-13 MED ORDER — HYDROCODONE-ACETAMINOPHEN 7.5-325 MG PO TABS: 1.0000 | ORAL_TABLET | ORAL | Status: DC | PRN

## 2015-11-13 NOTE — Telephone Encounter (Signed)
Hydrocodone-Acetaminophen 7.5/325mg Qty 120 Tablets °

## 2015-11-13 NOTE — Telephone Encounter (Signed)
Rx done. 

## 2015-12-16 ENCOUNTER — Telehealth: Payer: Self-pay | Admitting: Orthopaedic Surgery

## 2015-12-16 NOTE — Telephone Encounter (Signed)
Hydrocodone-Acetaminophen 7.5/325mg Qty 120 Tablets °

## 2015-12-17 MED ORDER — HYDROCODONE-ACETAMINOPHEN 7.5-325 MG PO TABS: 1.0000 | ORAL_TABLET | ORAL | Status: DC | PRN

## 2015-12-17 NOTE — Telephone Encounter (Signed)
Rx done. 

## 2016-01-15 ENCOUNTER — Ambulatory Visit (INDEPENDENT_AMBULATORY_CARE_PROVIDER_SITE_OTHER): Payer: Medicaid Other | Admitting: Orthopaedic Surgery

## 2016-01-15 ENCOUNTER — Encounter: Payer: Self-pay | Admitting: Orthopaedic Surgery

## 2016-01-15 VITALS — BP 115/67 | HR 85 | Temp 97.9°F | Ht 63.0 in | Wt 237.0 lb

## 2016-01-15 DIAGNOSIS — Z7189 Other specified counseling: Secondary | ICD-10-CM

## 2016-01-15 DIAGNOSIS — M545 Low back pain, unspecified: Secondary | ICD-10-CM

## 2016-01-15 DIAGNOSIS — G8929 Other chronic pain: Secondary | ICD-10-CM

## 2016-01-15 MED ORDER — HYDROCODONE-ACETAMINOPHEN 7.5-325 MG PO TABS: 1.0000 | ORAL_TABLET | ORAL | Status: DC | PRN

## 2016-01-15 NOTE — Progress Notes (Signed)
Patient WJ:XBJYNWGN:Tina Ross, female DOB:[birth year], 35 y.o. FAO:[id number]  Chief Complaint  Patient presents with  . Follow-up    left knee pain.  She is still not ready to discuss the right carpal tunnel surgery    HPI  Tina Ross is a 35 y.o. female who has chronic pain of the lower back.  She is stable.  She has less pain in the summer. She has no paresthesias.  She has no bowel or bladder problems.  She has lost from size 24 to size 16 over the first part of this year.  That is super.  I told her to continue.  She feels better.  HPI  Body mass index is 41.99 kg/(m^2).  ROS  Review of Systems  HENT: Negative for congestion.   Respiratory: Negative for cough and shortness of breath.   Cardiovascular: Negative for chest pain and leg swelling.  Endocrine: Negative for cold intolerance.  Musculoskeletal: Positive for myalgias, back pain and arthralgias.  Allergic/Immunologic: Negative for environmental allergies.    Past Medical History  Diagnosis Date  . Bipolar 1 disorder (HCC)   . Carpal tunnel syndrome   . Bipolar disorder (HCC)   . Anxiety   . Depression   . Anemia   . Headache(784.0)   . Bipolar 1 disorder, depressed, moderate (HCC) 10/04/2012  . Hidradenitis 10/04/2012  . Nausea and vomiting in pregnancy 10/04/2012  . Heart murmur     irregular heartbeat as child-cleared by cardiologist  . Sleep apnea     Does not use CPAP  . Arthritis     knees  . Asthma     rarely uses inhaler- last times used was last week  . Abnormal Pap smear     HPV  . Insomnia   . Post partum depression     Past Surgical History  Procedure Laterality Date  . Cholecystectomy    . Brain surgery  2009  . Carpal tunnel release Left 12/28/2013    Procedure: CARPAL TUNNEL RELEASE;  Surgeon: Darreld McleanWayne Tatiyanna Lashley, MD;  Location: [facility];  Service: Orthopedics;  Laterality: Left;    Family History  Problem Relation Age of Onset  . Hypertension Mother   . Heart disease Mother   . Mental  illness Mother   . Thyroid disease Mother   . Dementia Mother   . Kidney disease Mother   . Cancer Mother     liver  . Hypertension Father   . Diabetes Father   . Heart disease Father   . Cancer Daughter     Bone  . Heart disease Daughter   . Diabetes Paternal Aunt   . Hypertension Maternal Grandmother   . Hypertension Maternal Grandfather   . Diabetes Maternal Grandfather   . Cancer Maternal Grandfather     Prostate, colon, bladder, lung  . Diabetes Paternal Grandmother   . Stroke Paternal Grandmother   . Diabetes Paternal Grandfather   . Cancer Paternal Grandfather     Bone and lung  . Diabetes Paternal Aunt     Social History Social History  Substance Use Topics  . Smoking status: Never Smoker   . Smokeless tobacco: Never Used  . Alcohol Use: No     Comment: Occ; not now    No Known Allergies  Current Outpatient Prescriptions  Medication Sig Dispense Refill  . albuterol (PROVENTIL HFA;VENTOLIN HFA) 108 (90 BASE) MCG/ACT inhaler Inhale 2 puffs into the lungs every 6 (six) hours as needed for wheezing. 1 Inhaler 2  .  HYDROcodone-acetaminophen (NORCO) 7.5-325 MG tablet Take 1 tablet by mouth every 4 (four) hours as needed for moderate pain (Must last 30 days.  Do not drive a car or operate machinery while on this medicine.). 120 tablet 0  . IRON PO Take 1 tablet by mouth daily.    Marland Kitchen. lurasidone (LATUDA) 20 MG TABS tablet Take by mouth.     No current facility-administered medications for this visit.     Physical Exam  Blood pressure 115/67, pulse 85, temperature 97.9 F (36.6 C), height 5\' 3"  (1.6 m), weight 237 lb (107.502 kg).  Constitutional: overall normal hygiene, normal nutrition, well developed, normal grooming, normal body habitus. Assistive device:none  Musculoskeletal: gait and station Limp none, muscle tone and strength are normal, no tremors or atrophy is present.  .  Neurological: coordination overall normal.  Deep tendon reflex/nerve stretch  intact.  Sensation normal.  Cranial nerves II-XII intact.   Skin:   normal overall no scars, lesions, ulcers or rashes. No psoriasis.  Psychiatric: Alert and oriented x 3.  Recent memory intact, remote memory unclear.  Normal mood and affect. Well groomed.  Good eye contact.  Cardiovascular: overall no swelling, no varicosities, no edema bilaterally, normal temperatures of the legs and arms, no clubbing, cyanosis and good capillary refill.  Lymphatic: palpation is normal.  Spine/Pelvis examination:  Inspection:  Overall, sacoiliac joint benign and hips nontender; without crepitus or defects.   Thoracic spine inspection: Alignment normal without kyphosis present   Lumbar spine inspection:  Alignment  with normal lumbar lordosis, without scoliosis apparent.   Thoracic spine palpation:  without tenderness of spinal processes   Lumbar spine palpation: with tenderness of lumbar area; without tightness of lumbar muscles    Range of Motion:   Lumbar flexion, forward flexion is 45  without pain or tenderness    Lumbar extension is 10  without pain or tenderness   Left lateral bend is Normal  without pain or tenderness   Right lateral bend is Normal without pain or tenderness   Straight leg raising is Normal   Strength & tone: Normal   Stability overall normal stability     The patient has been educated about the nature of the problem(s) and counseled on treatment options.  The patient appeared to understand what I have discussed and is in agreement with it.  Encounter Diagnoses  Name Primary?  . Midline low back pain without sciatica Yes  . Encounter for chronic pain management     PLAN Call if any problems.  Precautions discussed.  Continue current medications.   Return to clinic 3 months   Continue weight loss  Electronically Signed Darreld McleanWayne Francena Zender, MD 7/5/20173:10 PM

## 2016-02-13 ENCOUNTER — Telehealth: Payer: Self-pay | Admitting: Orthopaedic Surgery

## 2016-02-13 MED ORDER — HYDROCODONE-ACETAMINOPHEN 7.5-325 MG PO TABS: 1.0000 | ORAL_TABLET | ORAL | 0 refills | Status: DC | PRN

## 2016-02-13 NOTE — Telephone Encounter (Signed)
Patient called and requested a refill on Hydrocodone/Acetaminophen 7.5-325 mgs.   Qty  120  Sig: Take 1 tablet by mouth every 4 (four) hours as needed for moderate pain (Must last 30 days. Do not drive a car or operate machinery while on this medicine.).

## 2016-03-18 ENCOUNTER — Telehealth: Payer: Self-pay | Admitting: Orthopaedic Surgery

## 2016-03-18 MED ORDER — HYDROCODONE-ACETAMINOPHEN 7.5-325 MG PO TABS: ORAL_TABLET | ORAL | 0 refills | Status: DC

## 2016-03-18 NOTE — Telephone Encounter (Signed)
Patient requests refill:  (Insurance is Medicaid) HYDROcodone-acetaminophen (NORCO) 7.5-325 MG tablet 02/13/2016    Sig - Route: Take 1 tablet by mouth every 4 (four) hours a

## 2016-03-31 ENCOUNTER — Telehealth: Payer: Self-pay | Admitting: Orthopaedic Surgery

## 2016-03-31 MED ORDER — HYDROCODONE-ACETAMINOPHEN 7.5-325 MG PO TABS: ORAL_TABLET | ORAL | 0 refills | Status: DC

## 2016-03-31 NOTE — Telephone Encounter (Signed)
Hydrocodone-Acetaminophen  7.5/325mg   Qty 55 Tablets

## 2016-04-08 ENCOUNTER — Ambulatory Visit: Payer: Medicaid Other | Admitting: Orthopaedic Surgery

## 2016-04-09 ENCOUNTER — Encounter: Payer: Self-pay | Admitting: Orthopaedic Surgery

## 2016-04-09 ENCOUNTER — Ambulatory Visit (INDEPENDENT_AMBULATORY_CARE_PROVIDER_SITE_OTHER): Payer: Medicaid Other | Admitting: Orthopaedic Surgery

## 2016-04-09 VITALS — BP 132/84 | HR 92 | Temp 97.5°F | Ht 63.0 in | Wt 251.0 lb

## 2016-04-09 DIAGNOSIS — M545 Low back pain, unspecified: Secondary | ICD-10-CM

## 2016-04-09 MED ORDER — HYDROCODONE-ACETAMINOPHEN 7.5-325 MG PO TABS: ORAL_TABLET | ORAL | 0 refills | Status: DC

## 2016-04-09 NOTE — Progress Notes (Signed)
Patient XB:JYNWGNFA:Tina Ross, female DOB:04/04/1981, 35 y.o. OZH:[id number]  Chief Complaint  Patient presents with  . Follow-up    left wrist and left knee    HPI  [name] is a 35 y.o. female who has chronic lower back pain.  She has no new trauma, no paresthesias.  She is doing her exercises.  She has no weakness.  She has begun to have pain in the left knee.  She has effusion but no giving way, no redness, no locking.  It began about a month ago.  She would like a knee brace. HPI  Body mass index is 44.46 kg/m.  ROS  Review of Systems  HENT: Negative for congestion.   Respiratory: Negative for cough and shortness of breath.   Cardiovascular: Negative for chest pain and leg swelling.  Endocrine: Negative for cold intolerance.  Musculoskeletal: Positive for arthralgias, back pain, gait problem, joint swelling and myalgias.  Allergic/Immunologic: Negative for environmental allergies.    Past Medical History:  Diagnosis Date  . Abnormal Pap smear    HPV  . Anemia   . Anxiety   . Arthritis    knees  . Asthma    rarely uses inhaler- last times used was last week  . Bipolar 1 disorder (HCC)   . Bipolar 1 disorder, depressed, moderate (HCC) 10/04/2012  . Bipolar disorder (HCC)   . Carpal tunnel syndrome   . Depression   . Headache(784.0)   . Heart murmur    irregular heartbeat as child-cleared by cardiologist  . Hidradenitis 10/04/2012  . Insomnia   . Nausea and vomiting in pregnancy 10/04/2012  . Post partum depression   . Sleep apnea    Does not use CPAP    Past Surgical History:  Procedure Laterality Date  . BRAIN SURGERY  2009  . CARPAL TUNNEL RELEASE Left 12/28/2013   Procedure: CARPAL TUNNEL RELEASE;  Surgeon: Darreld McleanWayne Ahriana Gunkel, MD;  Location: [facility];  Service: Orthopedics;  Laterality: Left;  . CHOLECYSTECTOMY      Family History  Problem Relation Age of Onset  . Hypertension Mother   . Heart disease Mother   . Mental illness Mother   . Thyroid  disease Mother   . Dementia Mother   . Kidney disease Mother   . Cancer Mother     liver  . Hypertension Father   . Diabetes Father   . Heart disease Father   . Cancer Daughter     Bone  . Heart disease Daughter   . Diabetes Paternal Aunt   . Hypertension Maternal Grandmother   . Hypertension Maternal Grandfather   . Diabetes Maternal Grandfather   . Cancer Maternal Grandfather     Prostate, colon, bladder, lung  . Diabetes Paternal Grandmother   . Stroke Paternal Grandmother   . Diabetes Paternal Grandfather   . Cancer Paternal Grandfather     Bone and lung  . Diabetes Paternal Aunt     Social History Social History  Substance Use Topics  . Smoking status: Never Smoker  . Smokeless tobacco: Never Used  . Alcohol use No     Comment: Occ; not now    No Known Allergies  Current Outpatient Prescriptions  Medication Sig Dispense Refill  . albuterol (PROVENTIL HFA;VENTOLIN HFA) 108 (90 BASE) MCG/ACT inhaler Inhale 2 puffs into the lungs every 6 (six) hours as needed for wheezing. 1 Inhaler 2  . HYDROcodone-acetaminophen (NORCO) 7.5-325 MG tablet One every six hours for pain as needed.  Do  not drive car or operate machinery while taking this medicine.  Must last 14 days. 50 tablet 0  . IRON PO Take 1 tablet by mouth daily.    Marland Kitchen lurasidone (LATUDA) 20 MG TABS tablet Take by mouth.     No current facility-administered medications for this visit.      Physical Exam  Blood pressure 132/84, pulse 92, temperature 97.5 F (36.4 C), height 5\' 3"  (1.6 m), weight 251 lb (113.9 kg).  Constitutional: overall normal hygiene, normal nutrition, well developed, normal grooming, normal body habitus. Assistive device:none  Musculoskeletal: gait and station Limp left, muscle tone and strength are normal, no tremors or atrophy is present.  .  Neurological: coordination overall normal.  Deep tendon reflex/nerve stretch intact.  Sensation normal.  Cranial nerves II-XII intact.   Skin:    Normal overall no scars, lesions, ulcers or rashes. No psoriasis.  Psychiatric: Alert and oriented x 3.  Recent memory intact, remote memory unclear.  Normal mood and affect. Well groomed.  Good eye contact.  Cardiovascular: overall no swelling, no varicosities, no edema bilaterally, normal temperatures of the legs and arms, no clubbing, cyanosis and good capillary refill.  Lymphatic: palpation is normal.  The left lower extremity is examined:  Inspection:  Thigh:  Non-tender and no defects  Knee has swelling 1+ effusion.                        Joint tenderness is present                        Patient is tender over the medial joint line  Lower Leg:  Has normal appearance and no tenderness or defects  Ankle:  Non-tender and no defects  Foot:  Non-tender and no defects Range of Motion:  Knee:  Range of motion is: 0-110                        Crepitus is  present  Ankle:  Range of motion is normal. Strength and Tone:  The left lower extremity has normal strength and tone. Stability:  Knee:  The knee is stable.  Ankle:  The ankle is stable.  Spine/Pelvis examination:  Inspection:  Overall, sacoiliac joint benign and hips nontender; without crepitus or defects.   Thoracic spine inspection: Alignment normal without kyphosis present   Lumbar spine inspection:  Alignment  with normal lumbar lordosis, without scoliosis apparent.   Thoracic spine palpation:  without tenderness of spinal processes   Lumbar spine palpation: with tenderness of lumbar area; without tightness of lumbar muscles    Range of Motion:   Lumbar flexion, forward flexion is 45 without pain or tenderness    Lumbar extension is 10 without pain or tenderness   Left lateral bend is Normal  without pain or tenderness   Right lateral bend is Normal without pain or tenderness   Straight leg raising is Normal   Strength & tone: Normal   Stability overall normal stability     The patient has been educated  about the nature of the problem(s) and counseled on treatment options.  The patient appeared to understand what I have discussed and is in agreement with it.  Encounter Diagnosis  Name Primary?  . Midline low back pain without sciatica Yes    PLAN Call if any problems.  Precautions discussed.  Continue current medications.   Return to clinic 2 months  Electronically Signed Darreld Mclean, MD 9/28/20172:15 PM

## 2016-04-16 ENCOUNTER — Ambulatory Visit: Payer: Medicaid Other | Admitting: Orthopaedic Surgery

## 2016-04-28 ENCOUNTER — Telehealth: Payer: Self-pay | Admitting: Orthopaedic Surgery

## 2016-04-28 NOTE — Telephone Encounter (Signed)
Patient called for refill:  HYDROcodone-acetaminophen (NORCO) 7.5-325 MG tablet 50 tablet  - insurance: Medicaid

## 2016-04-29 MED ORDER — HYDROCODONE-ACETAMINOPHEN 7.5-325 MG PO TABS: ORAL_TABLET | ORAL | 0 refills | Status: DC

## 2016-05-12 ENCOUNTER — Telehealth: Payer: Self-pay | Admitting: Orthopaedic Surgery

## 2016-05-12 MED ORDER — HYDROCODONE-ACETAMINOPHEN 7.5-325 MG PO TABS: ORAL_TABLET | ORAL | 0 refills | Status: DC

## 2016-05-12 NOTE — Telephone Encounter (Signed)
Patient called for refill  HYDROcodone-acetaminophen (NORCO) 7.5-325 MG tablet 40 tablet   - insurance: Medicaid

## 2016-05-26 ENCOUNTER — Other Ambulatory Visit: Payer: Self-pay | Admitting: *Deleted

## 2016-05-26 ENCOUNTER — Telehealth: Payer: Self-pay | Admitting: Orthopaedic Surgery

## 2016-05-26 MED ORDER — HYDROCODONE-ACETAMINOPHEN 7.5-325 MG PO TABS
ORAL_TABLET | ORAL | 0 refills | Status: DC
Start: 2016-05-26 — End: 2016-06-11

## 2016-05-26 NOTE — Telephone Encounter (Signed)
Patient requests refill of pain medication - aware Dr Hilda LiasKeeling is out of office this week and that Dr Romeo AppleHarrison is reviewing requests:   HYDROcodone-acetaminophen (NORCO) 7.5-325 MG tablet: quantity 30

## 2016-06-09 ENCOUNTER — Ambulatory Visit: Payer: Medicaid Other | Admitting: Orthopaedic Surgery

## 2016-06-11 ENCOUNTER — Ambulatory Visit: Payer: Medicaid Other | Admitting: Orthopaedic Surgery

## 2016-06-11 ENCOUNTER — Telehealth: Payer: Self-pay | Admitting: Orthopaedic Surgery

## 2016-06-11 MED ORDER — HYDROCODONE-ACETAMINOPHEN 7.5-325 MG PO TABS: ORAL_TABLET | ORAL | 0 refills | Status: DC

## 2016-06-11 NOTE — Telephone Encounter (Signed)
Hydrocodone-Acetaminophen  7.5/325mg  Qty 30 Tablets °

## 2016-06-18 ENCOUNTER — Ambulatory Visit: Payer: Medicaid Other | Admitting: Orthopaedic Surgery

## 2016-06-23 ENCOUNTER — Ambulatory Visit (INDEPENDENT_AMBULATORY_CARE_PROVIDER_SITE_OTHER): Payer: Medicaid Other | Admitting: Orthopaedic Surgery

## 2016-06-23 ENCOUNTER — Encounter: Payer: Self-pay | Admitting: Orthopaedic Surgery

## 2016-06-23 VITALS — BP 125/63 | HR 94 | Temp 98.1°F | Ht 63.0 in | Wt 249.0 lb

## 2016-06-23 DIAGNOSIS — G5601 Carpal tunnel syndrome, right upper limb: Secondary | ICD-10-CM | POA: Diagnosis not present

## 2016-06-23 DIAGNOSIS — M67432 Ganglion, left wrist: Secondary | ICD-10-CM | POA: Diagnosis not present

## 2016-06-23 MED ORDER — HYDROCODONE-ACETAMINOPHEN 7.5-325 MG PO TABS: ORAL_TABLET | ORAL | 0 refills | Status: DC

## 2016-06-23 NOTE — Progress Notes (Signed)
Patient ZO:XWRUEAVW:Tina Ross, female DOB:1980/10/07, 35 y.o. UJW:[id number]  Chief Complaint  Patient presents with  . Follow-up    BACK AND KNEE PAIN    HPI  Tina GauzeJennifer A Ross is a 35 y.o. female who has carpal tunnel syndrome on the right.  Tina Ross has had surgery on the left and now the right hand is hurting more and more with numbness.  Tina Ross would like to have surgery on the right hand.  Tina Ross uses a Cock-up splint but it is not helping now.  Tina Ross has no new trauma.  Tina Ross also has a ganglion cyst on the left hand near the dorsal wrist.  Tina Ross would like to have this removed as well.  Tina Ross has no numbness or redness here and no trauma. HPI  Body mass index is 44.11 kg/m.  ROS  Review of Systems  HENT: Negative for congestion.   Respiratory: Negative for cough and shortness of breath.   Cardiovascular: Negative for chest pain and leg swelling.  Endocrine: Negative for cold intolerance.  Musculoskeletal: Positive for arthralgias, back pain, gait problem, joint swelling and myalgias.  Allergic/Immunologic: Negative for environmental allergies.    Past Medical History:  Diagnosis Date  . Abnormal Pap smear    HPV  . Anemia   . Anxiety   . Arthritis    knees  . Asthma    rarely uses inhaler- last times used was last week  . Bipolar 1 disorder (HCC)   . Bipolar 1 disorder, depressed, moderate (HCC) 10/04/2012  . Bipolar disorder (HCC)   . Carpal tunnel syndrome   . Depression   . Headache(784.0)   . Heart murmur    irregular heartbeat as child-cleared by cardiologist  . Hidradenitis 10/04/2012  . Insomnia   . Nausea and vomiting in pregnancy 10/04/2012  . Post partum depression   . Sleep apnea    Does not use CPAP    Past Surgical History:  Procedure Laterality Date  . BRAIN SURGERY  2009  . CARPAL TUNNEL RELEASE Left 12/28/2013   Procedure: CARPAL TUNNEL RELEASE;  Surgeon: Darreld McleanWayne Clebert Wenger, MD;  Location: [facility];  Service: Orthopedics;  Laterality: Left;  . CHOLECYSTECTOMY       Family History  Problem Relation Age of Onset  . Hypertension Mother   . Heart disease Mother   . Mental illness Mother   . Thyroid disease Mother   . Dementia Mother   . Kidney disease Mother   . Cancer Mother     liver  . Hypertension Father   . Diabetes Father   . Heart disease Father   . Cancer Daughter     Bone  . Heart disease Daughter   . Diabetes Paternal Aunt   . Hypertension Maternal Grandmother   . Hypertension Maternal Grandfather   . Diabetes Maternal Grandfather   . Cancer Maternal Grandfather     Prostate, colon, bladder, lung  . Diabetes Paternal Grandmother   . Stroke Paternal Grandmother   . Diabetes Paternal Grandfather   . Cancer Paternal Grandfather     Bone and lung  . Diabetes Paternal Aunt     Social History Social History  Substance Use Topics  . Smoking status: Never Smoker  . Smokeless tobacco: Never Used  . Alcohol use No     Comment: Occ; not now    No Known Allergies  Current Outpatient Prescriptions  Medication Sig Dispense Refill  . albuterol (PROVENTIL HFA;VENTOLIN HFA) 108 (90 BASE) MCG/ACT inhaler Inhale 2 puffs into  the lungs every 6 (six) hours as needed for wheezing. 1 Inhaler 2  . HYDROcodone-acetaminophen (NORCO) 7.5-325 MG tablet One every six hours for pain as needed.  Do not drive car or operate machinery while taking this medicine.  Must last 14 days. 30 tablet 0  . IRON PO Take 1 tablet by mouth daily.    Marland Kitchen. lurasidone (LATUDA) 20 MG TABS tablet Take by mouth.     No current facility-administered medications for this visit.      Physical Exam  Blood pressure 125/63, pulse 94, temperature 98.1 F (36.7 C), height 5\' 3"  (1.6 m), weight 249 lb (112.9 kg).  Constitutional: overall normal hygiene, normal nutrition, well developed, normal grooming, normal body habitus. Assistive device:cock-up splint  Musculoskeletal: gait and station Limp none, muscle tone and strength are normal, no tremors or atrophy is  present.  .  Neurological: coordination overall normal.  Deep tendon reflex/nerve stretch intact.  Sensation normal.  Cranial nerves II-XII intact.   Skin:   Normal overall no scars, lesions, ulcers or rashes. No psoriasis.  Psychiatric: Alert and oriented x 3.  Recent memory intact, remote memory unclear.  Normal mood and affect. Well groomed.  Good eye contact.  Cardiovascular: overall no swelling, no varicosities, no edema bilaterally, normal temperatures of the legs and arms, no clubbing, cyanosis and good capillary refill.  Lymphatic: palpation is normal.  Tina Ross has positive Phalen and Tinel on the right.  Well healed scar on left wrist volar.  Grips are good.  Tina Ross has small ganglion on the dorsum of the right hand near base of the second and third metacarpals size of a small grape.  There is no redness.  The patient has been educated about the nature of the problem(s) and counseled on treatment options.  The patient appeared to understand what I have discussed and is in agreement with it.  Encounter Diagnoses  Name Primary?  . Carpal tunnel syndrome on right Yes  . Ganglion cyst of wrist, left     PLAN Call if any problems.  Precautions discussed.  Continue current medications.   Return to clinic to see Dr. Romeo AppleHarrison for evaluation for possible surgery.   Electronically Signed Darreld McleanWayne Zaray Gatchel, MD 12/12/20172:48 PM

## 2016-06-29 ENCOUNTER — Telehealth: Payer: Self-pay | Admitting: Orthopaedic Surgery

## 2016-06-29 NOTE — Telephone Encounter (Signed)
[name] called and stated that the nerve conduction tests were done by Dr. Gerilyn Pilgrimoonquah many years ago when he was in the [place] office.  She could not remember exactly when just to say it was a long time ago.

## 2016-06-30 ENCOUNTER — Telehealth: Payer: Self-pay | Admitting: Orthopaedic Surgery

## 2016-06-30 MED ORDER — HYDROCODONE-ACETAMINOPHEN 7.5-325 MG PO TABS: ORAL_TABLET | ORAL | 0 refills | Status: DC

## 2016-06-30 NOTE — Telephone Encounter (Signed)
Hydrocodone-Acetaminophen  7.5/325mg  Qty 30 Tablets °

## 2016-07-08 ENCOUNTER — Telehealth: Payer: Self-pay | Admitting: Orthopaedic Surgery

## 2016-07-08 NOTE — Telephone Encounter (Signed)
Patient requests refill on Hydrocodone/Acetaminophen 7.5-325  Mgs.   Qty  25   Sig: One every six hours for pain as needed. Do not drive car or operate machinery while taking this medicine. Must last 14 days

## 2016-07-14 NOTE — Telephone Encounter (Signed)
No more narcotics 

## 2016-07-15 ENCOUNTER — Telehealth: Payer: Self-pay | Admitting: Orthopedic Surgery

## 2016-07-15 NOTE — Telephone Encounter (Signed)
THIS WAS A CONSULT FROM DR Hilda LiasKEELING, LEFT NERVE CONDUCTION STUDY FOR YOU BEFORE HOLIDAY, PLEASE ADVISE REGARDING SCHEDULING

## 2016-07-15 NOTE — Telephone Encounter (Signed)
Patient is wanting to set up an appointment to see Dr. Romeo AppleHarrison to discuss possible surgery.  Please advise

## 2016-07-15 NOTE — Telephone Encounter (Signed)
Arrange an appointment

## 2016-07-15 NOTE — Telephone Encounter (Signed)
Routing to front to schedule 

## 2016-07-28 ENCOUNTER — Telehealth: Payer: Self-pay | Admitting: Orthopaedic Surgery

## 2016-07-28 NOTE — Telephone Encounter (Signed)
Denied.

## 2016-07-28 NOTE — Telephone Encounter (Signed)
Hydrocodone-Acetaminophen 7.5/325mg  Qty 25 Tablets °

## 2017-08-08 ENCOUNTER — Observation Stay (HOSPITAL_COMMUNITY)
Admission: EM | Admit: 2017-08-08 | Discharge: 2017-08-09 | Disposition: A | Discharge status: Home / Self Care | Payer: Medicaid Other | Attending: Internal Medicine | Admitting: Internal Medicine

## 2017-08-08 ENCOUNTER — Emergency Department (HOSPITAL_COMMUNITY): Payer: Medicaid Other

## 2017-08-08 ENCOUNTER — Other Ambulatory Visit: Payer: Self-pay

## 2017-08-08 ENCOUNTER — Encounter (HOSPITAL_COMMUNITY): Payer: Self-pay

## 2017-08-08 DIAGNOSIS — R0789 Other chest pain: Principal | ICD-10-CM | POA: Insufficient documentation

## 2017-08-08 DIAGNOSIS — Z79899 Other long term (current) drug therapy: Secondary | ICD-10-CM | POA: Insufficient documentation

## 2017-08-08 DIAGNOSIS — G4700 Insomnia, unspecified: Secondary | ICD-10-CM | POA: Diagnosis not present

## 2017-08-08 DIAGNOSIS — R9431 Abnormal electrocardiogram [ECG] [EKG]: Secondary | ICD-10-CM | POA: Diagnosis not present

## 2017-08-08 DIAGNOSIS — F3132 Bipolar disorder, current episode depressed, moderate: Secondary | ICD-10-CM | POA: Diagnosis not present

## 2017-08-08 DIAGNOSIS — R079 Chest pain, unspecified: Secondary | ICD-10-CM | POA: Diagnosis present

## 2017-08-08 DIAGNOSIS — J45909 Unspecified asthma, uncomplicated: Secondary | ICD-10-CM | POA: Insufficient documentation

## 2017-08-08 DIAGNOSIS — E781 Pure hyperglyceridemia: Secondary | ICD-10-CM | POA: Diagnosis present

## 2017-08-08 DIAGNOSIS — G4733 Obstructive sleep apnea (adult) (pediatric): Secondary | ICD-10-CM | POA: Diagnosis not present

## 2017-08-08 DIAGNOSIS — J4520 Mild intermittent asthma, uncomplicated: Secondary | ICD-10-CM | POA: Diagnosis not present

## 2017-08-08 DIAGNOSIS — G4730 Sleep apnea, unspecified: Secondary | ICD-10-CM | POA: Diagnosis present

## 2017-08-08 DIAGNOSIS — F418 Other specified anxiety disorders: Secondary | ICD-10-CM

## 2017-08-08 LAB — I-STAT TROPONIN, ED
Troponin i, poc: 0 ng/mL (ref 0.00–0.08)
Troponin i, poc: 0 ng/mL (ref 0.00–0.08)

## 2017-08-08 LAB — BASIC METABOLIC PANEL
Anion gap: 10 (ref 5–15)
BUN: 14 mg/dL (ref 6–20)
CALCIUM: 9.2 mg/dL (ref 8.9–10.3)
CHLORIDE: 106 mmol/L (ref 101–111)
CO2: 22 mmol/L (ref 22–32)
CREATININE: 0.6 mg/dL (ref 0.44–1.00)
GFR calc Af Amer: >60 mL/min (ref >60)
GFR calc non Af Amer: >60 mL/min (ref >60)
GLUCOSE: 102 mg/dL — AB (ref 65–99)
Potassium: 4.1 mmol/L (ref 3.5–5.1)
Sodium: 138 mmol/L (ref 135–145)

## 2017-08-08 LAB — SEDIMENTATION RATE: SED RATE: 38 mm/h — AB (ref 0–22)

## 2017-08-08 LAB — CBC WITH DIFFERENTIAL/PLATELET
BASOS ABS: 0 10*3/uL (ref 0.0–0.1)
Basophils Relative: 0 %
EOS PCT: 2 %
Eosinophils Absolute: 0.2 10*3/uL (ref 0.0–0.7)
HCT: 41.9 % (ref 36.0–46.0)
Hemoglobin: 13.1 g/dL (ref 12.0–15.0)
LYMPHS ABS: 1.8 10*3/uL (ref 0.7–4.0)
LYMPHS PCT: 22 %
MCH: 29.8 pg (ref 26.0–34.0)
MCHC: 31.3 g/dL (ref 30.0–36.0)
MCV: 95.2 fL (ref 78.0–100.0)
MONO ABS: 0.5 10*3/uL (ref 0.1–1.0)
MONOS PCT: 6 %
Neutro Abs: 5.7 10*3/uL (ref 1.7–7.7)
Neutrophils Relative %: 70 %
PLATELETS: 222 10*3/uL (ref 150–400)
RBC: 4.4 MIL/uL (ref 3.87–5.11)
RDW: 13.9 % (ref 11.5–15.5)
WBC: 8.2 10*3/uL (ref 4.0–10.5)

## 2017-08-08 LAB — RAPID URINE DRUG SCREEN, HOSP PERFORMED
Amphetamines: NOT DETECTED
BARBITURATES: NOT DETECTED
BENZODIAZEPINES: NOT DETECTED
COCAINE: NOT DETECTED
OPIATES: NOT DETECTED
TETRAHYDROCANNABINOL: NOT DETECTED

## 2017-08-08 LAB — I-STAT BETA HCG BLOOD, ED (MC, WL, AP ONLY): I-stat hCG, quantitative: <5 m[IU]/mL (ref <5)

## 2017-08-08 LAB — TROPONIN I: Troponin I: <0.03 ng/mL (ref <0.03)

## 2017-08-08 LAB — HEMOGLOBIN A1C
Hgb A1c MFr Bld: 5.1 % (ref 4.8–5.6)
Mean Plasma Glucose: 99.67 mg/dL

## 2017-08-08 LAB — D-DIMER, QUANTITATIVE: D-Dimer, Quant: 0.42 ug/mL-FEU (ref 0.00–0.50)

## 2017-08-08 LAB — TSH: TSH: 3.306 u[IU]/mL (ref 0.350–4.500)

## 2017-08-08 MED ORDER — NITROGLYCERIN 0.4 MG SL SUBL
0.4000 mg | SUBLINGUAL_TABLET | SUBLINGUAL | Status: DC | PRN
  Administered 2017-08-08 (×2): 0.4 mg via SUBLINGUAL
  Filled 2017-08-08: qty 1

## 2017-08-08 MED ORDER — ASPIRIN EC 325 MG PO TBEC: 325.0000 mg | DELAYED_RELEASE_TABLET | Freq: Every day | ORAL | Status: DC

## 2017-08-08 MED ORDER — ACETAMINOPHEN 325 MG PO TABS: 650.0000 mg | ORAL_TABLET | ORAL | Status: DC | PRN

## 2017-08-08 MED ORDER — GI COCKTAIL ~~LOC~~
30.0000 mL | Freq: Four times a day (QID) | ORAL | Status: DC | PRN
  Filled 2017-08-08: qty 30

## 2017-08-08 MED ORDER — ONDANSETRON HCL 4 MG/2ML IJ SOLN: 4.0000 mg | Freq: Four times a day (QID) | INTRAMUSCULAR | Status: DC | PRN

## 2017-08-08 MED ORDER — PANTOPRAZOLE SODIUM 40 MG PO TBEC
40.0000 mg | DELAYED_RELEASE_TABLET | Freq: Every day | ORAL | Status: DC
  Administered 2017-08-08 – 2017-08-09 (×2): 40 mg via ORAL
  Filled 2017-08-08 (×2): qty 1

## 2017-08-08 MED ORDER — ZOLPIDEM TARTRATE 5 MG PO TABS: 5.0000 mg | ORAL_TABLET | Freq: Every evening | ORAL | Status: DC | PRN

## 2017-08-08 MED ORDER — ASPIRIN 81 MG PO CHEW
324.0000 mg | CHEWABLE_TABLET | Freq: Once | ORAL | Status: AC
  Administered 2017-08-08: 324 mg via ORAL
  Filled 2017-08-08: qty 4

## 2017-08-08 MED ORDER — MORPHINE SULFATE (PF) 2 MG/ML IV SOLN
2.0000 mg | INTRAVENOUS | Status: DC | PRN
  Administered 2017-08-08 – 2017-08-09 (×5): 2 mg via INTRAVENOUS
  Filled 2017-08-08 (×5): qty 1

## 2017-08-08 MED ORDER — KETOROLAC TROMETHAMINE 15 MG/ML IJ SOLN
15.0000 mg | Freq: Four times a day (QID) | INTRAMUSCULAR | Status: AC
  Administered 2017-08-08 – 2017-08-09 (×4): 15 mg via INTRAVENOUS
  Filled 2017-08-08 (×4): qty 1

## 2017-08-08 MED ORDER — MORPHINE SULFATE (PF) 4 MG/ML IV SOLN
4.0000 mg | INTRAVENOUS | Status: AC | PRN
  Administered 2017-08-08 (×2): 4 mg via INTRAVENOUS
  Filled 2017-08-08 (×2): qty 1

## 2017-08-08 MED ORDER — ENOXAPARIN SODIUM 40 MG/0.4ML ~~LOC~~ SOLN
40.0000 mg | SUBCUTANEOUS | Status: DC
  Administered 2017-08-08: 40 mg via SUBCUTANEOUS
  Filled 2017-08-08: qty 0.4

## 2017-08-08 NOTE — Progress Notes (Signed)
Patient discussed with ER Dr Clarene DukeMcManus. 37 yo female without significant CAD risk factors admitted with atypical chest pain, by report reproducible to palpation. EKG does show some inferior and lateral ST/T changes. Trops have been negative. I have recommended obs overnight at [facility], please make npo in case needs stress test tomrrow. Will need EKG and enzymes cycled, echo tomorrow.   Dina RichJonathan Juliet Vasbinder MD

## 2017-08-08 NOTE — H&P (Addendum)
History and Physical  Tina Ross:[id number] DOB: Sep 22, 1980 DOA: 08/08/2017  Referring physician: Clarene Ross PCP: Tina Natal, MD   Chief Complaint: chest pain   HPI: Tina Ross is a 37 y.o. female with asthma, bipolar disorder, heart murmur, sleep apnea (not on CPAP) who presented to ED today with sudden onset of left sided chest pain that radiated down left arm and work her up from sleeping.  Her pain was associated with taking a deep breath and reports that her pain has been reproducible with palpating the anterior chest wall on the left side.  Pt denies coughing, fever and URI symptoms.  She denies fever and chills.  She denies nausea and vomiting.  The patient became very concerned because her pain radiated down the left arm.  That prompted her to come to the ED for evaluation today.  She was given nitroglycerin in ED without relief in symptoms.  Her pain was improved with the administration of morphine.     ED Course:  The patient has had relief of chest pain with morphine.  She has had 2 negative troponin tests.  Her EKG has been abnormal with inferior and lateral ST/T changes.  Cardiology was consulted in ED and Dr. Wyline Mood recommended patient be observed at [facility] overnight and to have an echocardiogram and possible stress testing in the morning. She will be made NPO after midnight.    Review of Systems: All systems reviewed and apart from history of presenting illness, are negative.  Past Medical History:  Diagnosis Date  . Abnormal Pap smear    HPV  . Anemia   . Anxiety   . Arthritis    knees  . Asthma    rarely uses inhaler- last times used was last week  . Bipolar 1 disorder (HCC)   . Bipolar 1 disorder, depressed, moderate (HCC) 10/04/2012  . Bipolar disorder (HCC)   . Carpal tunnel syndrome   . Depression   . Headache(784.0)   . Heart murmur    irregular heartbeat as child-cleared by cardiologist  . Hidradenitis 10/04/2012  . Insomnia   . Nausea and  vomiting in pregnancy 10/04/2012  . Post partum depression   . Sleep apnea    Does not use CPAP   Past Surgical History:  Procedure Laterality Date  . BRAIN SURGERY  2009  . CARPAL TUNNEL RELEASE Left 12/28/2013   Procedure: CARPAL TUNNEL RELEASE;  Surgeon: Tina Mclean, MD;  Location: [facility];  Service: Orthopedics;  Laterality: Left;  . CHOLECYSTECTOMY     Social History:  reports that  has never smoked. she has never used smokeless tobacco. She reports that she does not drink alcohol or use drugs.  No Known Allergies  Family History  Problem Relation Age of Onset  . Hypertension Mother   . Heart disease Mother   . Mental illness Mother   . Thyroid disease Mother   . Dementia Mother   . Kidney disease Mother   . Cancer Mother        liver  . Hypertension Father   . Diabetes Father   . Heart disease Father   . Cancer Daughter        Bone  . Heart disease Daughter   . Hypertension Maternal Grandmother   . Hypertension Maternal Grandfather   . Diabetes Maternal Grandfather   . Cancer Maternal Grandfather        Prostate, colon, bladder, lung  . Diabetes Paternal Grandmother   .  Stroke Paternal Grandmother   . Diabetes Paternal Grandfather   . Cancer Paternal Grandfather        Bone and lung  . Diabetes Paternal Aunt   . Diabetes Paternal Aunt     Prior to Admission medications   Medication Sig Start Date End Date Taking? Authorizing Provider  levonorgestrel (MIRENA) 20 MCG/24HR IUD 1 each by Intrauterine route once.   Yes [provider]  naproxen sodium (ALEVE) 220 MG tablet Take 440 mg by mouth daily as needed (pain).   Yes [provider]   Physical Exam: Vitals:   08/08/17 1300 08/08/17 1330 08/08/17 1430 08/08/17 1500  BP: 117/85 102/75 135/80 140/86  Pulse: 80 72 73 75  Resp: 17 19 18 18   Temp:      TempSrc:      SpO2: 99% 99% 98% 100%  Weight:      Height:         General exam: Moderately built and nourished patient, lying  comfortably supine on the gurney in no obvious distress.  Head, eyes and ENT: Nontraumatic and normocephalic. Pupils equally reacting to light and accommodation. Oral mucosa moist.  Neck: Supple. No JVD, carotid bruit or thyromegaly.  Lymphatics: No lymphadenopathy.  Respiratory system: Clear to auscultation. No increased work of breathing.  Chest: Reproducible anterior left-sided chest wall pain with palpation.  Cardiovascular system: S1 and S2 heard, RRR. No JVD, murmurs, gallops, clicks or pedal edema.  Gastrointestinal system: Abdomen is nondistended, soft and nontender. Normal bowel sounds heard. No organomegaly or masses appreciated.  Central nervous system: Alert and oriented. No focal neurological deficits.  Extremities: Symmetric 5 x 5 power. Peripheral pulses symmetrically felt.   Skin: No rashes or acute findings.  Musculoskeletal system: Negative exam.  Psychiatry: Pleasant and cooperative.  Labs on Admission:  Basic Metabolic Panel: Recent Labs  Lab 08/08/17 1154  NA 138  K 4.1  CL 106  CO2 22  GLUCOSE 102*  BUN 14  CREATININE 0.60  CALCIUM 9.2   Liver Function Tests: No results for input(s): AST, ALT, ALKPHOS, BILITOT, PROT, ALBUMIN in the last 168 hours. No results for input(s): LIPASE, AMYLASE in the last 168 hours. No results for input(s): AMMONIA in the last 168 hours. CBC: Recent Labs  Lab 08/08/17 1154  WBC 8.2  NEUTROABS 5.7  HGB 13.1  HCT 41.9  MCV 95.2  PLT 222   Cardiac Enzymes: No results for input(s): CKTOTAL, CKMB, CKMBINDEX, TROPONINI in the last 168 hours.  BNP (last 3 results) No results for input(s): PROBNP in the last 8760 hours. CBG: No results for input(s): GLUCAP in the last 168 hours.  Radiological Exams on Admission: Dg Chest 2 View  Result Date: 08/08/2017 CLINICAL DATA:  Patient with left-sided chest pain radiating into the left arm. EXAM: CHEST  2 VIEW COMPARISON:  Chest radiograph 09/18/2013. FINDINGS:  Monitoring leads overlie the patient. Normal cardiac and mediastinal contours. No consolidative pulmonary opacities. No pleural effusion or pneumothorax. Osseous skeleton is unremarkable. IMPRESSION: No acute cardiopulmonary process. Electronically Signed   By: Tina Ross  Davis M.D.   On: 08/08/2017 14:20   EKG: Independently reviewed. EKG with inferior and lateral ST/T changes.   Assessment/Plan Principal Problem:   Chest pain Active Problems:   Bipolar 1 disorder, depressed, moderate (HCC)   Depression with anxiety   Sleep apnea   Insomnia   Asthma   Abnormal EKG  1. Atypical chest pain with an abnormal EKG- admitted for observation and continuous telemetry monitoring.  Cycle troponin to rule out acute ischemia.  Cardiology consult in the morning.  Echocardiogram ordered.  N.p.o. after midnight in anticipation of possible stress testing.  Check a fasting lipid panel.  Check a hemoglobin A1c, TSH, vitamin D, sed rate and CRP.  Protonix ordered for possible GERD symptoms and for GI protection.  Continue to provide nitroglycerin as needed.  Morphine is ordered for pain symptoms which does seem to control pain.  Patient has been given an aspirin in the ED.  Given the pleuritic nature of her chest pain, will try toradol to relieve symptoms.   2. Abnormal EKG- inferior and lateral ST/T changes noted.  Follow on continuous telemetry.  Repeat EKG with symptoms of chest pain and repeat in the morning.  Cardiology has been consulted and will see the patient tomorrow morning. 3. Sleep apnea-patient reports she is not currently using CPAP. 4. Bipolar disorder- patient reports symptoms have been stable. 5. Asthma, mild intermittent-patient reports no recent symptoms and has not been using an inhaler recently. 6. Insomnia- medication ordered as needed for symptoms.  DVT Prophylaxis: Lovenox Code Status: Full Family Communication: Bedside Disposition Plan: To be determined  Time spent: 57  minutes  Standley Dakins, MD [facility] Pager [phone]  If 7PM-7AM, please contact night-coverage [email or web address] Password [facility] 08/08/2017, 3:18 PM

## 2017-08-08 NOTE — ED Notes (Signed)
Pain unchanged with Nitro x1.

## 2017-08-08 NOTE — ED Notes (Signed)
Gave EKG to Dr. McManus  

## 2017-08-08 NOTE — ED Provider Notes (Signed)
[facility] EMERGENCY DEPARTMENT Provider Note   CSN: [id number] Arrival date & time: 08/08/17  1128     History   Chief Complaint Chief Complaint  Patient presents with  . Chest Pain    HPI ONNIKA SIEBEL is a 37 y.o. female.  HPI  Pt was seen at 1145. Per pt, c/o unknown onset and persistence of constant left sided chest "pain" that woke her up from sleep at 0930 this morning PTA. Pt describes the CP as "squeezing," and radiating down her left arm. Has been associated with SOB. Denies injury, no cough, no palpitations, no abd pain, no N/V/D, no back pain, no calf/LE pain or unilateral swelling, no recent long travel, no fevers, no rash.     Past Medical History:  Diagnosis Date  . Abnormal Pap smear    HPV  . Anemia   . Anxiety   . Arthritis    knees  . Asthma    rarely uses inhaler- last times used was last week  . Bipolar 1 disorder (HCC)   . Bipolar 1 disorder, depressed, moderate (HCC) 10/04/2012  . Bipolar disorder (HCC)   . Carpal tunnel syndrome   . Depression   . Headache(784.0)   . Heart murmur    irregular heartbeat as child-cleared by cardiologist  . Hidradenitis 10/04/2012  . Insomnia   . Nausea and vomiting in pregnancy 10/04/2012  . Post partum depression   . Sleep apnea    Does not use CPAP    Patient Active Problem List   Diagnosis Date Noted  . Boil, vulva 02/07/2014  . Encounter for IUD insertion 01/18/2014  . Depression with anxiety 12/26/2012  . Abnormal cervical Pap smear with positive HPV DNA test 11/03/2012  . Bipolar 1 disorder, depressed, moderate (HCC) 10/04/2012  . Hidradenitis 10/04/2012    Past Surgical History:  Procedure Laterality Date  . BRAIN SURGERY  2009  . CARPAL TUNNEL RELEASE Left 12/28/2013   Procedure: CARPAL TUNNEL RELEASE;  Surgeon: Darreld Mclean, MD;  Location: [facility];  Service: Orthopedics;  Laterality: Left;  . CHOLECYSTECTOMY      OB History    Gravida Para Term Preterm AB Living   4 3 3   1 3    SAB  TAB Ectopic Multiple Live Births   1       3       Home Medications    Prior to Admission medications   Medication Sig Start Date End Date Taking? Authorizing Provider  albuterol (PROVENTIL HFA;VENTOLIN HFA) 108 (90 BASE) MCG/ACT inhaler Inhale 2 puffs into the lungs every 6 (six) hours as needed for wheezing. 03/23/13   Cresenzo-Dishmon, Scarlette Calico, CNM  HYDROcodone-acetaminophen (NORCO) 7.5-325 MG tablet One every six hours for pain as needed.  Do not drive car or operate machinery while taking this medicine.  Must last 14 days. 06/30/16   Darreld Mclean, MD  IRON PO Take 1 tablet by mouth daily.    [provider]  lurasidone (LATUDA) 20 MG TABS tablet Take by mouth.    [provider]    Family History Family History  Problem Relation Age of Onset  . Hypertension Mother   . Heart disease Mother   . Mental illness Mother   . Thyroid disease Mother   . Dementia Mother   . Kidney disease Mother   . Cancer Mother        liver  . Hypertension Father   . Diabetes Father   . Heart disease Father   .  Cancer Daughter        Bone  . Heart disease Daughter   . Hypertension Maternal Grandmother   . Hypertension Maternal Grandfather   . Diabetes Maternal Grandfather   . Cancer Maternal Grandfather        Prostate, colon, bladder, lung  . Diabetes Paternal Grandmother   . Stroke Paternal Grandmother   . Diabetes Paternal Grandfather   . Cancer Paternal Grandfather        Bone and lung  . Diabetes Paternal Aunt   . Diabetes Paternal Aunt   Father: CAD with MI at 63yo Mother: deceased late 31's d/t complications from CHF    Social History Social History   Tobacco Use  . Smoking status: Never Smoker  . Smokeless tobacco: Never Used  Substance Use Topics  . Alcohol use: No    Comment: Occ; not now  . Drug use: No     Allergies   Patient has no known allergies.   Review of Systems Review of Systems ROS: Statement: All systems negative except as  marked or noted in the HPI; Constitutional: Negative for fever and chills. ; ; Eyes: Negative for eye pain, redness and discharge. ; ; ENMT: Negative for ear pain, hoarseness, nasal congestion, sinus pressure and sore throat. ; ; Cardiovascular: Negative for chest pain, palpitations, diaphoresis, dyspnea and peripheral edema. ; ; Respiratory: Negative for cough, wheezing and stridor. ; ; Gastrointestinal: Negative for nausea, vomiting, diarrhea, abdominal pain, blood in stool, hematemesis, jaundice and rectal bleeding. . ; ; Genitourinary: Negative for dysuria, flank pain and hematuria. ; ; Musculoskeletal: Negative for back pain and neck pain. Negative for swelling and trauma.; ; Skin: Negative for pruritus, rash, abrasions, blisters, bruising and skin lesion.; ; Neuro: Negative for headache, lightheadedness and neck stiffness. Negative for weakness, altered level of consciousness, altered mental status, extremity weakness, paresthesias, involuntary movement, seizure and syncope.       Physical Exam Updated Vital Signs BP 134/82 (BP Location: Right Arm)   Pulse 74   Temp 98.3 F (36.8 C) (Oral)   Resp 20   Ht 5\' 4"  (1.626 m)   Wt 127 kg (280 lb)   SpO2 100%   BMI 48.06 kg/m   Physical Exam 1150: Physical examination:  Nursing notes reviewed; Vital signs and O2 SAT reviewed;  Constitutional: Well developed, Well nourished, Well hydrated, In no acute distress; Head:  Normocephalic, atraumatic; Eyes: EOMI, PERRL, No scleral icterus; ENMT: Mouth and pharynx normal, Mucous membranes moist; Neck: Supple, Full range of motion, No lymphadenopathy; Cardiovascular: Regular rate and rhythm, No gallop; Respiratory: Breath sounds clear & equal bilaterally, No wheezes.  Speaking full sentences with ease, Normal respiratory effort/excursion; Chest: +left anterior chest wall tender to palp. No deformity, no soft tissue crepitus, no rash. Movement normal; Abdomen: Soft, Nontender, Nondistended, Normal bowel  sounds; Genitourinary: No CVA tenderness; Extremities: Pulses normal, No tenderness, No edema, No calf edema or asymmetry.; Neuro: AA&Ox3, Major CN grossly intact.  Speech clear. No gross focal motor or sensory deficits in extremities.; Skin: Color normal, Warm, Dry.   ED Treatments / Results  Labs (all labs ordered are listed, but only abnormal results are displayed)   EKG  EKG Interpretation  Date/Time:  Sunday August 08 2017 11:34:59 EST Ventricular Rate:  76 PR Interval:  144 QRS Duration: 74 QT Interval:  374 QTC Calculation: 420 R Axis:   68 Text Interpretation:  Normal sinus rhythm ST & T wave abnormality, consider inferior ischemia ST & T wave  abnormality, consider anterolateral ischemia When compared with ECG of 09/18/2013 ST-t wave abnormality Inferior leads and Anterolateral leads is now Present Confirmed by Samuel Jester [phone]) on 08/08/2017 11:54:36 AM        EKG Interpretation  Date/Time:  Sunday August 08 2017 11:59:42 EST Ventricular Rate:  70 PR Interval:  144 QRS Duration: 88 QT Interval:  386 QTC Calculation: 417 R Axis:   1 Text Interpretation:  Sinus rhythm Borderline T abnormalities, inferior leads Since last tracing of earlier today ST-t wave abnormality is improving Confirmed by Samuel Jester [phone]) on 08/08/2017 12:03:33 PM        Radiology   Procedures Procedures (including critical care time)  Medications Ordered in ED Medications  aspirin chewable tablet 324 mg (not administered)  nitroGLYCERIN (NITROSTAT) SL tablet 0.4 mg (not administered)  morphine 4 MG/ML injection 4 mg (not administered)     Initial Impression / Assessment and Plan / ED Course  I have reviewed the triage vital signs and the nursing notes.  Pertinent labs & imaging results that were available during my care of the patient were reviewed by me and considered in my medical decision making (see chart for details).  MDM Reviewed: previous chart, nursing note  and vitals Reviewed previous: labs and ECG Interpretation: labs, ECG and x-ray   Results for orders placed or performed during the hospital encounter of 08/08/17  CBC with Differential  Result Value Ref Range   WBC 8.2 4.0 - 10.5 K/uL   RBC 4.40 3.87 - 5.11 MIL/uL   Hemoglobin 13.1 12.0 - 15.0 g/dL   HCT 09.8 11.9 - 14.7 %   MCV 95.2 78.0 - 100.0 fL   MCH 29.8 26.0 - 34.0 pg   MCHC 31.3 30.0 - 36.0 g/dL   RDW 82.9 56.2 - 13.0 %   Platelets 222 150 - 400 K/uL   Neutrophils Relative % 70 %   Neutro Abs 5.7 1.7 - 7.7 K/uL   Lymphocytes Relative 22 %   Lymphs Abs 1.8 0.7 - 4.0 K/uL   Monocytes Relative 6 %   Monocytes Absolute 0.5 0.1 - 1.0 K/uL   Eosinophils Relative 2 %   Eosinophils Absolute 0.2 0.0 - 0.7 K/uL   Basophils Relative 0 %   Basophils Absolute 0.0 0.0 - 0.1 K/uL  D-dimer, quantitative  Result Value Ref Range   D-Dimer, Quant 0.42 0.00 - 0.50 ug/mL-FEU  Basic metabolic panel  Result Value Ref Range   Sodium 138 135 - 145 mmol/L   Potassium 4.1 3.5 - 5.1 mmol/L   Chloride 106 101 - 111 mmol/L   CO2 22 22 - 32 mmol/L   Glucose, Bld 102 (H) 65 - 99 mg/dL   BUN 14 6 - 20 mg/dL   Creatinine, Ser 8.65 0.44 - 1.00 mg/dL   Calcium 9.2 8.9 - 78.4 mg/dL   GFR calc non Af Amer >60 >60 mL/min   GFR calc Af Amer >60 >60 mL/min   Anion gap 10 5 - 15  I-stat troponin, ED  Result Value Ref Range   Troponin i, poc 0.00 0.00 - 0.08 ng/mL   Comment 3          I-Stat beta hCG blood, ED  Result Value Ref Range   I-stat hCG, quantitative <5.0 <5 mIU/mL   Comment 3          I-stat troponin, ED  Result Value Ref Range   Troponin i, poc 0.00 0.00 - 0.08 ng/mL   Comment 3  Dg Chest 2 View Result Date: 08/08/2017 CLINICAL DATA:  Patient with left-sided chest pain radiating into the left arm. EXAM: CHEST  2 VIEW COMPARISON:  Chest radiograph 09/18/2013. FINDINGS: Monitoring leads overlie the patient. Normal cardiac and mediastinal contours. No consolidative pulmonary  opacities. No pleural effusion or pneumothorax. Osseous skeleton is unremarkable. IMPRESSION: No acute cardiopulmonary process. Electronically Signed   By: Annia Beltrew  Davis M.D.   On: 08/08/2017 14:20     1455:   Initial troponin negative; delta troponin also negative. 1st EKG with acute STTW changes (no ST elevations). 2nd EKG with resolving STTW changes.  Both EKG's not similar to old EKG in [facility].  ASA, SL ntg and IV morphine given with improvement of symptoms. Symptoms are atypical, but given abnl initial EKG and FHx, pt will need observation admit.  T/C to [facility] Dr. Wyline MoodBranch, case discussed, including:  HPI, pertinent PM/SHx, VS/PE, dx testing, ED course and treatment:  Agrees with abnormal initial EKG, [facility], he will consult tomorrow.   1505:  T/C to [facility] Dr. Laural BenesJohnson, case discussed, including:  HPI, pertinent PM/SHx, VS/PE, dx testing, ED course and treatment:  Agreeable to admit.       Final Clinical Impressions(s) / ED Diagnoses   Final diagnoses:  None    ED Discharge Orders    None       Samuel JesterMcManus, Ido Wollman, DO 08/11/17 1553

## 2017-08-08 NOTE — ED Triage Notes (Signed)
Patient reports of left sided chest pain that radiates down left arm. States pain woke her up out of sleep. Reports of pain worse when taking deep breath in.

## 2017-08-09 ENCOUNTER — Observation Stay (HOSPITAL_BASED_OUTPATIENT_CLINIC_OR_DEPARTMENT_OTHER): Payer: Medicaid Other

## 2017-08-09 DIAGNOSIS — R9431 Abnormal electrocardiogram [ECG] [EKG]: Secondary | ICD-10-CM

## 2017-08-09 DIAGNOSIS — E781 Pure hyperglyceridemia: Secondary | ICD-10-CM | POA: Diagnosis present

## 2017-08-09 DIAGNOSIS — R079 Chest pain, unspecified: Secondary | ICD-10-CM

## 2017-08-09 DIAGNOSIS — J45909 Unspecified asthma, uncomplicated: Secondary | ICD-10-CM | POA: Diagnosis not present

## 2017-08-09 DIAGNOSIS — R0789 Other chest pain: Secondary | ICD-10-CM

## 2017-08-09 DIAGNOSIS — Z79899 Other long term (current) drug therapy: Secondary | ICD-10-CM | POA: Diagnosis not present

## 2017-08-09 LAB — ECHOCARDIOGRAM COMPLETE
CHL CUP DOP CALC LVOT VTI: 23 cm
CHL CUP MV DEC (S): 183
CHL CUP STROKE VOLUME: 52 mL
E decel time: 183 msec
E/e' ratio: 6.34
FS: 37 % (ref 28–44)
Height: 64 in
IV/PV OW: 0.8
LA vol: 63.1 mL
LADIAMINDEX: 1.62 cm/m2
LASIZE: 40 mm
LAVOLA4C: 55.4 mL
LAVOLIN: 25.5 mL/m2
LEFT ATRIUM END SYS DIAM: 40 mm
LV E/e' medial: 6.34
LV E/e'average: 6.34
LV SIMPSON'S DISK: 61
LV TDI E'LATERAL: 17.2
LV TDI E'MEDIAL: 11.1
LV dias vol index: 35 mL/m2
LV dias vol: 86 mL (ref 46–106)
LV sys vol index: 14 mL/m2
LV sys vol: 34 mL (ref 14–42)
LVELAT: 17.2 cm/s
LVOT area: 2.54 cm2
LVOT peak grad rest: 4 mmHg
LVOTD: 18 mm
LVOTPV: 106 cm/s
LVOTSV: 58 mL
Lateral S' vel: 11.5 cm/s
MVPG: 5 mmHg
MVPKAVEL: 72.8 m/s
MVPKEVEL: 109 m/s
PW: 9.72 mm — AB (ref 0.6–1.1)
TAPSE: 21.7 mm
Weight: 4480 oz

## 2017-08-09 LAB — COMPREHENSIVE METABOLIC PANEL
ALBUMIN: 3.3 g/dL — AB (ref 3.5–5.0)
ALK PHOS: 73 U/L (ref 38–126)
ALT: 31 U/L (ref 14–54)
ANION GAP: 9 (ref 5–15)
AST: 25 U/L (ref 15–41)
BILIRUBIN TOTAL: 0.1 mg/dL — AB (ref 0.3–1.2)
BUN: 16 mg/dL (ref 6–20)
CALCIUM: 8.7 mg/dL — AB (ref 8.9–10.3)
CO2: 25 mmol/L (ref 22–32)
Chloride: 103 mmol/L (ref 101–111)
Creatinine, Ser: 0.6 mg/dL (ref 0.44–1.00)
GFR calc Af Amer: >60 mL/min (ref >60)
GLUCOSE: 116 mg/dL — AB (ref 65–99)
Potassium: 3.9 mmol/L (ref 3.5–5.1)
Sodium: 137 mmol/L (ref 135–145)
TOTAL PROTEIN: 6.2 g/dL — AB (ref 6.5–8.1)

## 2017-08-09 LAB — LIPID PANEL
Cholesterol: 152 mg/dL (ref 0–200)
HDL: 24 mg/dL — AB (ref >40)
LDL CALC: UNDETERMINED mg/dL (ref 0–99)
TRIGLYCERIDES: 608 mg/dL — AB (ref <150)
Total CHOL/HDL Ratio: 6.3 RATIO
VLDL: UNDETERMINED mg/dL (ref 0–40)

## 2017-08-09 LAB — CBC
HCT: 38.9 % (ref 36.0–46.0)
HEMOGLOBIN: 12.3 g/dL (ref 12.0–15.0)
MCH: 30.3 pg (ref 26.0–34.0)
MCHC: 31.6 g/dL (ref 30.0–36.0)
MCV: 95.8 fL (ref 78.0–100.0)
Platelets: 244 10*3/uL (ref 150–400)
RBC: 4.06 MIL/uL (ref 3.87–5.11)
RDW: 13.9 % (ref 11.5–15.5)
WBC: 10.8 10*3/uL — ABNORMAL HIGH (ref 4.0–10.5)

## 2017-08-09 LAB — LIPASE, BLOOD: LIPASE: 36 U/L (ref 11–51)

## 2017-08-09 LAB — TROPONIN I: Troponin I: <0.03 ng/mL (ref <0.03)

## 2017-08-09 LAB — C-REACTIVE PROTEIN

## 2017-08-09 LAB — VITAMIN D 25 HYDROXY (VIT D DEFICIENCY, FRACTURES): VIT D 25 HYDROXY: 18.7 ng/mL — AB (ref 30.0–100.0)

## 2017-08-09 NOTE — Consult Note (Signed)
Cardiology Consult    Patient ID: Tina Ross; [id number]; Nov 08, 1980   Admit date: 08/08/2017 Date of Consult: 08/09/2017  Primary Care Provider: Lyndal Pulley, MD Primary Cardiologist: New - Dr. Harl Bowie  Patient Profile    Tina Ross is a 37 y.o. female with past medical history of asthma, bipolar disorder, and history of a childhood heart murmur (no intervention needed at that time) who is being seen today for the evaluation of chest pain at the request of Dr. Wynetta Emery.   History of Present Illness    Tina Ross reports being in her usual state of health until yesterday morning when she awoke from sleep at 0500 with chest discomfort which she describes as a "vice around her heart". She noticed the pain was worse with breathing, therefore she took very shallow breaths to assist with this. She denies any associated nausea, vomiting, or diaphoresis. Pain did radiate along her left shoulder. She was given SL NTG and IV Toradol while in the ED with no improvement in her symptoms. Reports IV Morphine helped intermittently. Her pain has never fully gone away and is at a 2/10 this morning. No association of her pain with exertion or positional changes.   She denies any personal history of CAD or cardiac arrhythmias. No known HTN, HLD, or Type 2 DM. Does not exercise regularly but is active in taking care of her 3 children (one of whom is on the list for a heart transplant due to cardiomegaly by the patient's report). Her father has a history of CAD with his first MI at age 64 and mother passed away in her [id number] due to complications from CHF. The patient denies any tobacco use or recreational drug use. Does consume alcohol on a social basis (less than 1 drink per month).   Initial EKG showed NSR, HR 76 with inferolateral TWI. A repeat EKG was obtained hours later and showed persistent TWI along Lead III and AVF but now resolved along the lateral leads. Labs show WBC 8.2, Hgb 13.1, platelets  222, Na+ 138, K+ 4.1, and creatinine 0.60. TSH 3.306.  UDS negative. CRP negative with Sed Rate elevated to 38. FLP showing total cholesterol of 152, Triglycerides 608, and LDL unable to be calculated. Initial and cyclic troponin values have been negative. CXR showing no acute cardiopulmonary process. Echocardiogram is pending.    Past Medical History:  Diagnosis Date  . Abnormal Pap smear    HPV  . Anemia   . Anxiety   . Arthritis    knees  . Asthma    rarely uses inhaler- last times used was last week  . Bipolar 1 disorder ([place])   . Bipolar 1 disorder, depressed, moderate ([place]) 10/04/2012  . Bipolar disorder ([place])   . Carpal tunnel syndrome   . Depression   . Headache(784.0)   . Heart murmur    irregular heartbeat as child-cleared by cardiologist  . Hidradenitis 10/04/2012  . Insomnia   . Nausea and vomiting in pregnancy 10/04/2012  . Post partum depression   . Sleep apnea    Does not use CPAP    Past Surgical History:  Procedure Laterality Date  . BRAIN SURGERY  2009  . CARPAL TUNNEL RELEASE Left 12/28/2013   Procedure: CARPAL TUNNEL RELEASE;  Surgeon: Sanjuana Kava, MD;  Location: [facility];  Service: Orthopedics;  Laterality: Left;  . CHOLECYSTECTOMY       Home Medications:  Prior to Admission medications   Medication Sig Start Date  End Date Taking? Authorizing Provider  levonorgestrel (MIRENA) 20 MCG/24HR IUD 1 each by Intrauterine route once.   Yes [provider]  naproxen sodium (ALEVE) 220 MG tablet Take 440 mg by mouth daily as needed (pain).   Yes [provider]    Inpatient Medications: Scheduled Meds: . aspirin EC  325 mg Oral Daily  . enoxaparin (LOVENOX) injection  40 mg Subcutaneous Q24H  . ketorolac  15 mg Intravenous Q6H  . pantoprazole  40 mg Oral Daily   Continuous Infusions:  PRN Meds: acetaminophen, gi cocktail, morphine injection, nitroGLYCERIN, ondansetron (ZOFRAN) IV, zolpidem  Allergies:   No Known Allergies  Social  History:   Social History   Socioeconomic History  . Marital status: Single    Spouse name: Not on file  . Number of children: Not on file  . Years of education: Not on file  . Highest education level: Not on file  Social Needs  . Financial resource strain: Not on file  . Food insecurity - worry: Not on file  . Food insecurity - inability: Not on file  . Transportation needs - medical: Not on file  . Transportation needs - non-medical: Not on file  Occupational History  . Not on file  Tobacco Use  . Smoking status: Never Smoker  . Smokeless tobacco: Never Used  Substance and Sexual Activity  . Alcohol use: No    Comment: Occ; not now  . Drug use: No  . Sexual activity: Yes    Birth control/protection: IUD  Other Topics Concern  . Not on file  Social History Narrative  . Not on file     Family History:    Family History  Problem Relation Age of Onset  . Hypertension Mother   . Heart disease Mother   . Mental illness Mother   . Thyroid disease Mother   . Dementia Mother   . Kidney disease Mother   . Cancer Mother        liver  . Hypertension Father   . Diabetes Father   . Heart disease Father   . Cancer Daughter        Bone  . Heart disease Daughter   . Hypertension Maternal Grandmother   . Hypertension Maternal Grandfather   . Diabetes Maternal Grandfather   . Cancer Maternal Grandfather        Prostate, colon, bladder, lung  . Diabetes Paternal Grandmother   . Stroke Paternal Grandmother   . Diabetes Paternal Grandfather   . Cancer Paternal Grandfather        Bone and lung  . Diabetes Paternal Aunt   . Diabetes Paternal Aunt       Review of Systems    General:  No chills, fever, night sweats or weight changes.  Cardiovascular:  No dyspnea on exertion, edema, orthopnea, palpitations, paroxysmal nocturnal dyspnea. Positive for chest pain.  Dermatological: No rash, lesions/masses Respiratory: No cough, dyspnea Urologic: No hematuria,  dysuria Abdominal:   No nausea, vomiting, diarrhea, bright red blood per rectum, melena, or hematemesis Neurologic:  No visual changes, wkns, changes in mental status. All other systems reviewed and are otherwise negative except as noted above.  Physical Exam/Data    Vitals:   08/08/17 1500 08/08/17 1600 08/08/17 2000 08/09/17 0601  BP: 140/86 123/76 110/76 98/60  Pulse: 75 83 80 70  Resp: 18 18 18 18   Temp:  98.5 F (36.9 C) 98.3 F (36.8 C) 98.2 F (36.8 C)  TempSrc:  Oral Oral  Oral  SpO2: 100% 99% 98% 98%  Weight:  280 lb (127 kg)    Height:  5' 4"  (1.626 m)      Intake/Output Summary (Last 24 hours) at 08/09/2017 0731 Last data filed at 08/08/2017 1700 Gross per 24 hour  Intake 240 ml  Output -  Net 240 ml   Filed Weights   08/08/17 1134 08/08/17 1600  Weight: 280 lb (127 kg) 280 lb (127 kg)   Body mass index is 48.06 kg/m.   General: Pleasant, morbidly obese Caucasian female appearing in NAD Psych: Normal affect. Neuro: Alert and oriented X 3. Moves all extremities spontaneously. HEENT: Normal  Neck: Supple without bruits. JVD unable to be assessed secondary to body habitus. Lungs:  Resp regular and unlabored, CTA without wheezing or rales. Heart: RRR no s3, s4, or murmurs. Tender to palpation along left pectoral region.  Abdomen: Soft, non-tender, non-distended, BS + x 4.  Extremities: No clubbing, cyanosis or edema. DP/PT/Radials 2+ and equal bilaterally.   EKG:  The EKG was personally reviewed and demonstrates: NSR, HR 76 with inferolateral TWI.  Telemetry:  Telemetry was personally reviewed and demonstrates:  NSR, HR in 70's to 90's with occasional PVC's.    Labs/Studies     Relevant CV Studies:  Echocardiogram: Pending  Laboratory Data:  Chemistry Recent Labs  Lab 08/08/17 1154 08/09/17 0225  NA 138 137  K 4.1 3.9  CL 106 103  CO2 22 25  GLUCOSE 102* 116*  BUN 14 16  CREATININE 0.60 0.60  CALCIUM 9.2 8.7*  GFRNONAA >60 >60  GFRAA >60  >60  ANIONGAP 10 9    Recent Labs  Lab 08/09/17 0225  PROT 6.2*  ALBUMIN 3.3*  AST 25  ALT 31  ALKPHOS 73  BILITOT 0.1*   Hematology Recent Labs  Lab 08/08/17 1154 08/09/17 0225  WBC 8.2 10.8*  RBC 4.40 4.06  HGB 13.1 12.3  HCT 41.9 38.9  MCV 95.2 95.8  MCH 29.8 30.3  MCHC 31.3 31.6  RDW 13.9 13.9  PLT 222 244   Cardiac Enzymes Recent Labs  Lab 08/08/17 1527 08/08/17 1958 08/09/17 0225  TROPONINI <0.03 <0.03 <0.03    Recent Labs  Lab 08/08/17 1153 08/08/17 1432  TROPIPOC 0.00 0.00    BNPNo results for input(s): BNP, PROBNP in the last 168 hours.  DDimer  Recent Labs  Lab 08/08/17 1154  DDIMER 0.42    Radiology/Studies:  Dg Chest 2 View  Result Date: 08/08/2017 CLINICAL DATA:  Patient with left-sided chest pain radiating into the left arm. EXAM: CHEST  2 VIEW COMPARISON:  Chest radiograph 09/18/2013. FINDINGS: Monitoring leads overlie the patient. Normal cardiac and mediastinal contours. No consolidative pulmonary opacities. No pleural effusion or pneumothorax. Osseous skeleton is unremarkable. IMPRESSION: No acute cardiopulmonary process. Electronically Signed   By: Lovey Newcomer M.D.   On: 08/08/2017 14:20     Assessment & Plan    1. Atypical Chest Pain/ Abnormal EKG - the patient presents with chest pain which has been present for > 24 hours and is exacerbated with coughing, deep breathing, and is reproducible on palpation. No associated with exertion or positional changes.  - she denies any history of known CAD, HTN, HLD, Type 2 DM or prior tobacco use. Her risk factors are morbid obesity and family history of CAD (father having initial MI in his 26's).  - initial EKG showed NSR, HR 76 with inferolateral TWI. A repeat EKG was obtained hours later and showed persistent TWI  along Lead III and AVF but now resolved along the lateral leads. Initial and cyclic troponin values have been negative. CXR showing no acute cardiopulmonary process. Echocardiogram is  pending to assess LV function.  - overall her symptoms seem atypical for a cardiac etiology and most consistent with pleuritic pain. If echocardiogram shows no significant abnormalities, would not pursue further inpatient ischemic evaluation at this time. Could consider outpatient NST in the setting of her abnormal EKG but this would be a 2-day study secondary to her body habitus.   2. Hypertriglyceridemia - Lipid Panel this admission shows total cholesterol of 152 and Triglycerides 608. Was obtained at 0200 and the patient had consumed a snack at 2200 which may have influenced results. LFT's within normal limits. Would repeat lab work prior to initiation of medications.   3. Asthma - no active wheezing appreciated on examination. Breathing is at baseline according to the patient.   4. Bipolar Disorder - reports having not taken her medications for the past month.  - followed by her PCP.   5. Morbid Obesity - BMI at 48. The patient reports she has lost 100 lbs within the past several years. Congratulated on this with continued diet and exercise encouraged.     For questions or updates, please contact [place] Please consult [email or web address] for contact info under Cardiology/STEMI.  Signed, Erma Heritage, PA-C 08/09/2017, 7:31 AM Pager: [phone]   Attending note Patient seen and discussed with PA Ahmed Prima, I agree with her documentation above. 37 yo female with history of bipolar, depression admitted with chest pain. Chest pain is noncardiac in description, lasting several hours worst with position, and reproducible to palpation.   WBC 8.2 Hgb 13.1 Plt 222 D-dimer neg K 4.1 Cr 0.6 UDS neg CRP neg ESR 38  Trop neg x 5 CXR no acute process Iniital EKG: inferior and lateral precordial TWIs. Serial EKGs showed resolution of lateral changes and less prominent inferior changes   37 yo female with limited CAD risk factors (though to me she does report father with MI later  30s)presents with noncardiac chest pain. Initial EKG with inferior and lateral TWIs. Troponins negative, echo is pending. We will f/u echo results, if normal then no plans for further inpatient testing. Her body habitus makes noninvasive testing difficult.  High TGs though not true fasting sample, will need to be repeated. Typically initial treatment for high TGs is dietary and lifestyle modification.    If normal echo would plan for discharge today, likely would do ibuprofen 626m tid x 7 days for MSK pain. We will arrange outpatient f/u   JCarlyle DollyMD

## 2017-08-09 NOTE — Progress Notes (Signed)
*  PRELIMINARY RESULTS* Echocardiogram 2D Echocardiogram has been performed.  Stacey DrainWhite, Ottilia Pippenger J 08/09/2017, 10:44 AM

## 2017-08-09 NOTE — Discharge Summary (Signed)
Physician Discharge Summary  Tina GauzeJennifer A Galentine WUJ:[id number] DOB: 1981-05-15 DOA: 08/08/2017  PCP: Arnaldo NatalFlippo, Jack, MD  Admit date: 08/08/2017 Discharge date: 08/09/2017  Admitted From: Home Disposition: Home  Recommendations for Outpatient Follow-up:  1. Follow up with PCP in 1-2 weeks. Please check repeat lipid panel (triglycerides) during outpatient follow-up. 2. Follow-up with cardiology as outpatient (office will arrange appointment)  Home Health: None Equipment/Devices: None  Discharge Condition: Fair  CODE STATUS: full code Diet recommendation: Heart Healthy     Discharge Diagnoses:  Principal Problem:   Chest pain, musculoskeletal  Active Problems:   Bipolar 1 disorder, depressed, moderate (HCC)   Depression with anxiety   Sleep apnea   Insomnia   Asthma   Abnormal EKG  Morbid Obesity  Brief narrative/history of present illness Please refer to admission H&P for details, in brief, 37 year old morbidly obese female with history of asthma, bipolar disorder, heart murmur, history of sleep apnea not on CPAP presented to the ED with sudden onset of left-sided chest pain radiating down left arm since the morning of admission. Patient reports pain with deep breath and reproducible on palpating her anterior chest wall on the left side. Denies any cough, chest trauma, lifting heavy weight or URI symptoms. Denies any fever.  In the ED vitals were stable. Pain relieved with IV morphine. EKG showed inferior and lateral T-wave inversion. 2 sets of troponin were negative. Cardiology was consulted and patient placed on observation.  Hospital course Chest pain, atypical Likely musculoskeletal and reproducible on exam. Serial troponins and subsequent EKG unremarkable. Stable on telemetry overnight. 2-D echo with normal EF and no wall motion abnormality. Triglycerides markedly elevated. Patient instructed to take scheduled NSAIDs for pain.  cardiology with arrange outpt follow  up.  Hypertriglyceridemia Triglycerides of 600. Provided resource on meal planning and counseled on excised and losing weight. Recommend rechecking labs during outpatient follow-up with PCP.  Morbid obesity (BMI of 48) Counseled on weight loss and excised.  Remaining medical issues are stable. Patient is discharged home with outpatient follow-up.   Consults: Cardiology  Procedure: 2-D echo  family communication: Friend at bedside Disposition: Home  Discharge Instructions   Allergies as of 08/09/2017   No Known Allergies     Medication List    TAKE these medications   levonorgestrel 20 MCG/24HR IUD Commonly known as:  MIRENA 1 each by Intrauterine route once.   naproxen sodium 220 MG tablet Commonly known as:  ALEVE Take 440 mg by mouth daily as needed (pain).       No Known Allergies      Procedures/Studies: Dg Chest 2 View  Result Date: 08/08/2017 CLINICAL DATA:  Patient with left-sided chest pain radiating into the left arm. EXAM: CHEST  2 VIEW COMPARISON:  Chest radiograph 09/18/2013. FINDINGS: Monitoring leads overlie the patient. Normal cardiac and mediastinal contours. No consolidative pulmonary opacities. No pleural effusion or pneumothorax. Osseous skeleton is unremarkable. IMPRESSION: No acute cardiopulmonary process. Electronically Signed   By: Annia Beltrew  Davis M.D.   On: 08/08/2017 14:20    2-D echo ------------------------------------------------------------------- Study Conclusions  - Left ventricle: The cavity size was normal. Wall thickness was   normal. Systolic function was normal. The estimated ejection   fraction was in the range of 60% to 65%. Wall motion was normal;   there were no regional wall motion abnormalities. - Aortic valve: Valve area (VTI): 2.11 cm^2. Valve area (Vmax):   1.92 cm^2. Valve area (Vmean): 1.93 cm^2.  Subjective: Still reports some left-sided chest pressure  radiating to the left arm. Stable on telemetry  overnight.  Discharge Exam: Vitals:   08/08/17 2000 08/09/17 0601  BP: 110/76 98/60  Pulse: 80 70  Resp: 18 18  Temp: 98.3 F (36.8 C) 98.2 F (36.8 C)  SpO2: 98% 98%   Vitals:   08/08/17 1500 08/08/17 1600 08/08/17 2000 08/09/17 0601  BP: 140/86 123/76 110/76 98/60  Pulse: 75 83 80 70  Resp: 18 18 18 18   Temp:  98.5 F (36.9 C) 98.3 F (36.8 C) 98.2 F (36.8 C)  TempSrc:  Oral Oral Oral  SpO2: 100% 99% 98% 98%  Weight:  127 kg (280 lb)    Height:  5\' 4"  (1.626 m)     General: Young obese female not in distress  HEENT: Moist mucosa, supple neck Chest: Episode pain and pressure over left side of the chest, clear to auscultation bilaterally CVS: Normal S1 and S2, no murmurs GI: Soft, nondistended, nontender Musculoskeletal: Warm, no edema    The results of significant diagnostics from this hospitalization (including imaging, microbiology, ancillary and laboratory) are listed below for reference.     Microbiology: No results found for this or any previous visit (from the past 240 hour(s)).   Labs: BNP (last 3 results) No results for input(s): BNP in the last 8760 hours. Basic Metabolic Panel: Recent Labs  Lab 08/08/17 1154 08/09/17 0225  NA 138 137  K 4.1 3.9  CL 106 103  CO2 22 25  GLUCOSE 102* 116*  BUN 14 16  CREATININE 0.60 0.60  CALCIUM 9.2 8.7*   Liver Function Tests: Recent Labs  Lab 08/09/17 0225  AST 25  ALT 31  ALKPHOS 73  BILITOT 0.1*  PROT 6.2*  ALBUMIN 3.3*   Recent Labs  Lab 08/09/17 0225  LIPASE 36   No results for input(s): AMMONIA in the last 168 hours. CBC: Recent Labs  Lab 08/08/17 1154 08/09/17 0225  WBC 8.2 10.8*  NEUTROABS 5.7  --   HGB 13.1 12.3  HCT 41.9 38.9  MCV 95.2 95.8  PLT 222 244   Cardiac Enzymes: Recent Labs  Lab 08/08/17 1527 08/08/17 1958 08/09/17 0225  TROPONINI <0.03 <0.03 <0.03   BNP: Invalid input(s): POCBNP CBG: No results for input(s): GLUCAP in the last 168 hours. D-Dimer Recent  Labs    08/08/17 1154  DDIMER 0.42   Hgb A1c Recent Labs    08/08/17 1516  HGBA1C 5.1   Lipid Profile Recent Labs    08/09/17 0225  CHOL 152  HDL 24*  LDLCALC UNABLE TO CALCULATE IF TRIGLYCERIDE OVER 400 mg/dL  TRIG 409*  CHOLHDL 6.3   Thyroid function studies Recent Labs    08/08/17 1516  TSH 3.306   Anemia work up No results for input(s): VITAMINB12, FOLATE, FERRITIN, TIBC, IRON, RETICCTPCT in the last 72 hours. Urinalysis    Component Value Date/Time   COLORURINE YELLOW 12/22/2013 0916   APPEARANCEUR CLEAR 12/22/2013 0916   LABSPEC >1.030 (H) 12/22/2013 0916   PHURINE 6.0 12/22/2013 0916   GLUCOSEU NEGATIVE 12/22/2013 0916   HGBUR NEGATIVE 12/22/2013 0916   BILIRUBINUR NEGATIVE 12/22/2013 0916   BILIRUBINUR neg 10/04/2012 1145   KETONESUR NEGATIVE 12/22/2013 0916   PROTEINUR NEGATIVE 12/22/2013 0916   UROBILINOGEN 0.2 12/22/2013 0916   NITRITE NEGATIVE 12/22/2013 0916   LEUKOCYTESUR NEGATIVE 12/22/2013 0916   Sepsis Labs Invalid input(s): PROCALCITONIN,  WBC,  LACTICIDVEN Microbiology No results found for this or any previous visit (from the past 240 hour(s)).   Time  coordinating discharge: < 30 minutes  SIGNED:   Eddie North, MD  [facility] 08/09/2017, 12:29 PM Pager   If 7PM-7AM, please contact night-coverage [email or web address] Password [email or web address]

## 2017-08-09 NOTE — Plan of Care (Signed)
Patient has had chest pain throughout the night and was medicated with prn medications.

## 2017-08-09 NOTE — Progress Notes (Signed)
Patient being d/c home with instructions. IV cath removed and intact. No c/o pain at this time or at site.

## 2017-08-09 NOTE — Discharge Instructions (Signed)

## 2017-08-10 LAB — HIV ANTIBODY (ROUTINE TESTING W REFLEX): HIV Screen 4th Generation wRfx: NONREACTIVE

## 2017-08-29 NOTE — Progress Notes (Deleted)
Cardiology Office Note    Date:  08/29/2017   ID:  Tina Ross, DOB 1981/07/02, MRN [id number]  PCP:  Avon GullyFanta, Tesfaye, MD  Cardiologist: Dina RichBranch, Jonathan, MD    No chief complaint on file.   History of Present Illness:    Tina Ross is a 37 y.o. female with past medical history of asthma, bipolar disorder, morbid obesity and history of childhood murmur who presents to the office today for hospital follow-up.   She was recently admitted to [facility] from 1/27 - 08/09/2017 for evaluation of chest pain which was worse with deep breathing and no association with exertion. Her pain was lasting for several hours at a time and reproducible with palpation. Her initial EKG showed NSR with inferolateral TWI and she was admitted for further observation. Cyclic troponin values were obtained and remained negative. Echo showed a preserved EF of 60-65% with no regional WMA. No further inpatient evaluation was pursued and she was instructed to take NSAIDS PRN at the time of discharge.     Past Medical History:  Diagnosis Date  . Abnormal Pap smear    HPV  . Anemia   . Anxiety   . Arthritis    knees  . Asthma    rarely uses inhaler- last times used was last week  . Bipolar 1 disorder (HCC)   . Bipolar 1 disorder, depressed, moderate (HCC) 10/04/2012  . Bipolar disorder (HCC)   . Carpal tunnel syndrome   . Depression   . Headache(784.0)   . Heart murmur    irregular heartbeat as child-cleared by cardiologist  . Hidradenitis 10/04/2012  . Insomnia   . Nausea and vomiting in pregnancy 10/04/2012  . Post partum depression   . Sleep apnea    Does not use CPAP    Past Surgical History:  Procedure Laterality Date  . BRAIN SURGERY  2009  . CARPAL TUNNEL RELEASE Left 12/28/2013   Procedure: CARPAL TUNNEL RELEASE;  Surgeon: Darreld McleanWayne Keeling, MD;  Location: [facility];  Service: Orthopedics;  Laterality: Left;  . CHOLECYSTECTOMY      Current Medications: Outpatient Medications Prior to  Visit  Medication Sig Dispense Refill  . levonorgestrel (MIRENA) 20 MCG/24HR IUD 1 each by Intrauterine route once.    . naproxen sodium (ALEVE) 220 MG tablet Take 440 mg by mouth daily as needed (pain).     No facility-administered medications prior to visit.      Allergies:   Patient has no known allergies.   Social History   Socioeconomic History  . Marital status: Single    Spouse name: Not on file  . Number of children: Not on file  . Years of education: Not on file  . Highest education level: Not on file  Social Needs  . Financial resource strain: Not on file  . Food insecurity - worry: Not on file  . Food insecurity - inability: Not on file  . Transportation needs - medical: Not on file  . Transportation needs - non-medical: Not on file  Occupational History  . Not on file  Tobacco Use  . Smoking status: Never Smoker  . Smokeless tobacco: Never Used  Substance and Sexual Activity  . Alcohol use: No    Comment: Occ; not now  . Drug use: No  . Sexual activity: Yes    Birth control/protection: IUD  Other Topics Concern  . Not on file  Social History Narrative  . Not on file     Family History:  The patient's ***family history includes Cancer in her daughter, maternal grandfather, mother, and paternal grandfather; Dementia in her mother; Diabetes in her father, maternal grandfather, paternal aunt, paternal aunt, paternal grandfather, and paternal grandmother; Heart disease in her daughter, father, and mother; Hypertension in her father, maternal grandfather, maternal grandmother, and mother; Kidney disease in her mother; Mental illness in her mother; Stroke in her paternal grandmother; Thyroid disease in her mother.   Review of Systems:   Please see the history of present illness.     General:  No chills, fever, night sweats or weight changes.  Cardiovascular:  No chest pain, dyspnea on exertion, edema, orthopnea, palpitations, paroxysmal nocturnal  dyspnea. Dermatological: No rash, lesions/masses Respiratory: No cough, dyspnea Urologic: No hematuria, dysuria Abdominal:   No nausea, vomiting, diarrhea, bright red blood per rectum, melena, or hematemesis Neurologic:  No visual changes, wkns, changes in mental status. All other systems reviewed and are otherwise negative except as noted above.   Physical Exam:    VS:  There were no vitals taken for this visit.   General: Well developed, well nourished,female appearing in no acute distress. Head: Normocephalic, atraumatic, sclera non-icteric, no xanthomas, nares are without discharge.  Neck: No carotid bruits. JVD not elevated.  Lungs: Respirations regular and unlabored, without wheezes or rales.  Heart: ***Regular rate and rhythm. No S3 or S4.  No murmur, no rubs, or gallops appreciated. Abdomen: Soft, non-tender, non-distended with normoactive bowel sounds. No hepatomegaly. No rebound/guarding. No obvious abdominal masses. Msk:  Strength and tone appear normal for age. No joint deformities or effusions. Extremities: No clubbing or cyanosis. No edema.  Distal pedal pulses are 2+ bilaterally. Neuro: Alert and oriented X 3. Moves all extremities spontaneously. No focal deficits noted. Psych:  Responds to questions appropriately with a normal affect. Skin: No rashes or lesions noted  Wt Readings from Last 3 Encounters:  08/08/17 280 lb (127 kg)  06/23/16 249 lb (112.9 kg)  04/09/16 251 lb (113.9 kg)        Studies/Labs Reviewed:   EKG:  EKG is*** ordered today.  The ekg ordered today demonstrates ***  Recent Labs: 08/08/2017: TSH 3.306 08/09/2017: ALT 31; BUN 16; Creatinine, Ser 0.60; Hemoglobin 12.3; Platelets 244; Potassium 3.9; Sodium 137   Lipid Panel    Component Value Date/Time   CHOL 152 08/09/2017 0225   TRIG 608 (H) 08/09/2017 0225   HDL 24 (L) 08/09/2017 0225   CHOLHDL 6.3 08/09/2017 0225   VLDL UNABLE TO CALCULATE IF TRIGLYCERIDE OVER 400 mg/dL 69/62/9528 4132    LDLCALC UNABLE TO CALCULATE IF TRIGLYCERIDE OVER 400 mg/dL 44/07/270 5366    Additional studies/ records that were reviewed today include:   Echocardiogram: 08/09/2017 Study Conclusions  - Left ventricle: The cavity size was normal. Wall thickness was   normal. Systolic function was normal. The estimated ejection   fraction was in the range of 60% to 65%. Wall motion was normal;   there were no regional wall motion abnormalities. - Aortic valve: Valve area (VTI): 2.11 cm^2. Valve area (Vmax):   1.92 cm^2. Valve area (Vmean): 1.93 cm^2.  Assessment:    No diagnosis found.   Plan:   In order of problems listed above:  1. ***    Medication Adjustments/Labs and Tests Ordered: Current medicines are reviewed at length with the patient today.  Concerns regarding medicines are outlined above.  Medication changes, Labs and Tests ordered today are listed in the Patient Instructions below. There are no Patient Instructions on  file for this visit.   Signed, Ellsworth Lennox, PA-C  08/29/2017 9:08 AM    [facility] [address] [place], [place] [address] Phone: [phone]

## 2017-08-30 ENCOUNTER — Ambulatory Visit: Payer: Medicaid Other | Admitting: Student

## 2017-08-31 ENCOUNTER — Encounter: Payer: Self-pay | Admitting: Student

## 2017-10-08 ENCOUNTER — Encounter (HOSPITAL_COMMUNITY): Payer: Self-pay

## 2017-10-08 ENCOUNTER — Emergency Department (HOSPITAL_COMMUNITY)
Admission: EM | Admit: 2017-10-08 | Discharge: 2017-10-08 | Disposition: A | Discharge status: Home / Self Care | Payer: Medicaid Other | Attending: Emergency Medicine | Admitting: Emergency Medicine

## 2017-10-08 ENCOUNTER — Emergency Department (HOSPITAL_COMMUNITY): Payer: Medicaid Other

## 2017-10-08 DIAGNOSIS — Y9389 Activity, other specified: Secondary | ICD-10-CM | POA: Diagnosis not present

## 2017-10-08 DIAGNOSIS — Y92007 Garden or yard of unspecified non-institutional (private) residence as the place of occurrence of the external cause: Secondary | ICD-10-CM | POA: Insufficient documentation

## 2017-10-08 DIAGNOSIS — W540XXA Bitten by dog, initial encounter: Secondary | ICD-10-CM | POA: Insufficient documentation

## 2017-10-08 DIAGNOSIS — Y999 Unspecified external cause status: Secondary | ICD-10-CM | POA: Insufficient documentation

## 2017-10-08 DIAGNOSIS — Z23 Encounter for immunization: Secondary | ICD-10-CM | POA: Insufficient documentation

## 2017-10-08 DIAGNOSIS — S71132A Puncture wound without foreign body, left thigh, initial encounter: Secondary | ICD-10-CM | POA: Diagnosis not present

## 2017-10-08 DIAGNOSIS — J45909 Unspecified asthma, uncomplicated: Secondary | ICD-10-CM | POA: Diagnosis not present

## 2017-10-08 DIAGNOSIS — S79922A Unspecified injury of left thigh, initial encounter: Secondary | ICD-10-CM | POA: Diagnosis present

## 2017-10-08 MED ORDER — IBUPROFEN 800 MG PO TABS
800.0000 mg | ORAL_TABLET | Freq: Once | ORAL | Status: AC
  Administered 2017-10-08: 800 mg via ORAL
  Filled 2017-10-08: qty 1

## 2017-10-08 MED ORDER — TRAMADOL HCL 50 MG PO TABS: 50.0000 mg | ORAL_TABLET | Freq: Four times a day (QID) | ORAL | 0 refills | Status: DC | PRN

## 2017-10-08 MED ORDER — AMOXICILLIN-POT CLAVULANATE 875-125 MG PO TABS
1.0000 | ORAL_TABLET | Freq: Once | ORAL | Status: AC
  Administered 2017-10-08: 1 via ORAL
  Filled 2017-10-08: qty 1

## 2017-10-08 MED ORDER — AMOXICILLIN-POT CLAVULANATE 875-125 MG PO TABS: 1.0000 | ORAL_TABLET | Freq: Two times a day (BID) | ORAL | 0 refills | Status: AC

## 2017-10-08 MED ORDER — TETANUS-DIPHTH-ACELL PERTUSSIS 5-2.5-18.5 LF-MCG/0.5 IM SUSP
0.5000 mL | Freq: Once | INTRAMUSCULAR | Status: AC
  Administered 2017-10-08: 0.5 mL via INTRAMUSCULAR
  Filled 2017-10-08: qty 0.5

## 2017-10-08 NOTE — ED Notes (Signed)
c-com contacted to notify animal control officer for report.

## 2017-10-08 NOTE — ED Notes (Signed)
Instructed pt to take all of antibiotics as prescribed. 

## 2017-10-08 NOTE — ED Notes (Signed)
Pt provided number for 911 communications so pt can follow up with [employer].

## 2017-10-08 NOTE — ED Notes (Signed)
[employer] officer in room to speak with pt

## 2017-10-08 NOTE — ED Provider Notes (Signed)
[facility] EMERGENCY DEPARTMENT Provider Note   CSN: [id number] Arrival date & time: 10/08/17  1730     History   Chief Complaint Chief Complaint  Patient presents with  . Animal Bite    HPI Tina Ross is a 37 y.o. female presenting with a dog bite to her left lower anterior thigh which occurred yesterday.  She was at her mailbox when a neighborhood dog who is old and almost blind (and is usually not loose) ran out and bit her in the thigh.  She reports she has played with the dog in the past and was therefore surprised that he bit her.  She has washed the wounds using soap and water but several of the puncture sites continue to bleed.  To her knowledge the dog is utd with his vaccines.  She reports the dog is dead as she noticed he was lying by the edge of the road (she assumes he was hit by a car) prior to coming here tonight. She is not acquaintances with the dogs owner but does live in the neighborhood. She denies any radiation of pain or other complaint.  She is not current with her tetanus vaccine.  The history is provided by the patient.    Past Medical History:  Diagnosis Date  . Abnormal Pap smear    HPV  . Anemia   . Anxiety   . Arthritis    knees  . Asthma    rarely uses inhaler- last times used was last week  . Bipolar 1 disorder (HCC)   . Bipolar 1 disorder, depressed, moderate (HCC) 10/04/2012  . Bipolar disorder (HCC)   . Carpal tunnel syndrome   . Depression   . Headache(784.0)   . Heart murmur    irregular heartbeat as child-cleared by cardiologist  . Hidradenitis 10/04/2012  . Insomnia   . Nausea and vomiting in pregnancy 10/04/2012  . Post partum depression   . Sleep apnea    Does not use CPAP    Patient Active Problem List   Diagnosis Date Noted  . Hypertriglyceridemia 08/09/2017  . Atypical chest pain   . Chest pain 08/08/2017  . Sleep apnea 08/08/2017  . Insomnia 08/08/2017  . Asthma 08/08/2017  . Abnormal EKG 08/08/2017  . Boil, vulva  02/07/2014  . Encounter for IUD insertion 01/18/2014  . Depression with anxiety 12/26/2012  . Abnormal cervical Pap smear with positive HPV DNA test 11/03/2012  . Bipolar 1 disorder, depressed, moderate (HCC) 10/04/2012  . Hidradenitis 10/04/2012    Past Surgical History:  Procedure Laterality Date  . BRAIN SURGERY  2009  . CARPAL TUNNEL RELEASE Left 12/28/2013   Procedure: CARPAL TUNNEL RELEASE;  Surgeon: Darreld Mclean, MD;  Location: [facility];  Service: Orthopedics;  Laterality: Left;  . CHOLECYSTECTOMY       OB History    Gravida  4   Para  3   Term  3   Preterm      AB  1   Living  3     SAB  1   TAB      Ectopic      Multiple      Live Births  3            Home Medications    Prior to Admission medications   Medication Sig Start Date End Date Taking? Authorizing Provider  amoxicillin-clavulanate (AUGMENTIN) 875-125 MG tablet Take 1 tablet by mouth every 12 (twelve) hours for 10 days. 10/08/17  10/18/17  Burgess AmorIdol, Quaniyah Bugh, PA-C  levonorgestrel (MIRENA) 20 MCG/24HR IUD 1 each by Intrauterine route once.    [provider]  naproxen sodium (ALEVE) 220 MG tablet Take 440 mg by mouth daily as needed (pain).    [provider]  traMADol (ULTRAM) 50 MG tablet Take 1 tablet (50 mg total) by mouth every 6 (six) hours as needed. 10/08/17   Burgess AmorIdol, Davone Shinault, PA-C    Family History Family History  Problem Relation Age of Onset  . Hypertension Mother   . Heart disease Mother   . Mental illness Mother   . Thyroid disease Mother   . Dementia Mother   . Kidney disease Mother   . Cancer Mother        liver  . Hypertension Father   . Diabetes Father   . Heart disease Father   . Cancer Daughter        Bone  . Heart disease Daughter   . Hypertension Maternal Grandmother   . Hypertension Maternal Grandfather   . Diabetes Maternal Grandfather   . Cancer Maternal Grandfather        Prostate, colon, bladder, lung  . Diabetes Paternal Grandmother   . Stroke  Paternal Grandmother   . Diabetes Paternal Grandfather   . Cancer Paternal Grandfather        Bone and lung  . Diabetes Paternal Aunt   . Diabetes Paternal Aunt     Social History Social History   Tobacco Use  . Smoking status: Never Smoker  . Smokeless tobacco: Never Used  Substance Use Topics  . Alcohol use: No    Comment: Occ; not now  . Drug use: No     Allergies   Patient has no known allergies.   Review of Systems Review of Systems  Constitutional: Negative for chills and fever.  HENT: Negative.   Eyes: Negative.   Respiratory: Negative.   Cardiovascular: Negative.   Gastrointestinal: Negative for nausea and vomiting.  Genitourinary: Negative.   Musculoskeletal: Negative for arthralgias and joint swelling.  Skin: Positive for wound. Negative for rash.  Neurological: Negative for weakness and headaches.  Psychiatric/Behavioral: Negative.      Physical Exam Updated Vital Signs BP 140/80 (BP Location: Left Arm)   Pulse 86   Temp 98.6 F (37 C) (Oral)   Resp 16   Wt 127 kg (280 lb)   SpO2 97%   BMI 48.06 kg/m   Physical Exam  Constitutional: She is oriented to person, place, and time. She appears well-developed and well-nourished.  HENT:  Head: Normocephalic.  Cardiovascular: Normal rate.  Pulmonary/Chest: Effort normal.  Neurological: She is alert and oriented to person, place, and time. No sensory deficit.  Skin:  Round raised hematoma with surrounding bruising and multiple small to medium punctures in a circular pattern left lower lateral thigh. No purulent dc.  No red streaking, no palpable fb.     ED Treatments / Results  Labs (all labs ordered are listed, but only abnormal results are displayed) Labs Reviewed - No data to display  EKG None  Radiology Dg Femur Min 2 Views Left  Result Date: 10/08/2017 CLINICAL DATA:  Dog bite EXAM: LEFT FEMUR 2 VIEWS COMPARISON:  None. FINDINGS: Puncture wound noted to the lateral aspect of the distal  thigh. No dissecting emphysema. No radiopaque foreign body or osseous abnormality. IMPRESSION: No acute osseous abnormality or radiopaque foreign body of the left thigh. Electronically Signed   By: Deatra RobinsonKevin  Herman M.D.   On:  10/08/2017 21:44    Procedures Procedures (including critical care time)  Medications Ordered in ED Medications  Tdap (BOOSTRIX) injection 0.5 mL (0.5 mLs Intramuscular Given 10/08/17 2103)  amoxicillin-clavulanate (AUGMENTIN) 875-125 MG per tablet 1 tablet (1 tablet Oral Given 10/08/17 2103)  ibuprofen (ADVIL,MOTRIN) tablet 800 mg (800 mg Oral Given 10/08/17 2103)     Initial Impression / Assessment and Plan / ED Course  I have reviewed the triage vital signs and the nursing notes.  Pertinent labs & imaging results that were available during my care of the patient were reviewed by me and considered in my medical decision making (see chart for details).     Animal control notified and deputy came and took report.  Imaging reviewed and discussed with pt.  Tetanus updated, augmenting started for tx of wound.  Discussed with pt she will need to confirm that the dog was utd and/or rabies free and has 10 days to start the rabies series if this info cannot be obtained. Pt understands plan.  Wound care instructions given with return precautions outlined.  Final Clinical Impressions(s) / ED Diagnoses   Final diagnoses:  Dog bite, initial encounter    ED Discharge Orders        Ordered    amoxicillin-clavulanate (AUGMENTIN) 875-125 MG tablet  Every 12 hours     10/08/17 2205    traMADol (ULTRAM) 50 MG tablet  Every 6 hours PRN     10/08/17 2205       Burgess Amor, PA-C 10/08/17 2333    Long, Arlyss Repress, MD 10/09/17 2049

## 2017-10-08 NOTE — Discharge Instructions (Signed)
You safely have 10 days from today to start obtaining your rabies vaccine as outlined below if animal control is not able to confirm that the dog that bit you was current with his rabies vaccine.  You will need to return here for this vaccine series.  You had been prescribed Augmentin to help prevent infection in your bite site.  Make sure you take the entire course of antibiotics.  Wash your wound twice daily with gentle soap and water.  Get rechecked immediately for any signs of infection including worsening pain, swelling or drainage from the wound sites.

## 2017-10-08 NOTE — ED Triage Notes (Signed)
Pt bit by neighbors pitbull yesterday on left upper thigh . Area is swollen and bruised with multiple punctures. Dog was up to date on shots per pt. And dog was put to sleep yesterday

## 2017-10-08 NOTE — ED Notes (Addendum)
Dog bite occurred yesterday per pt from [place] [address] [place].  Was told that dog was put to sleep.  Pt's last tetanus shot is unknown.

## 2017-10-27 ENCOUNTER — Encounter: Payer: Self-pay | Admitting: Adult Health

## 2017-11-24 ENCOUNTER — Ambulatory Visit: Payer: Medicaid Other | Admitting: Advanced Practice Midwife

## 2017-11-24 ENCOUNTER — Other Ambulatory Visit (HOSPITAL_COMMUNITY)
Admission: RE | Admit: 2017-11-24 | Discharge: 2017-11-24 | Disposition: A | Discharge status: Home / Self Care | Payer: Medicaid Other | Source: Ambulatory Visit | Attending: Adult Health | Admitting: Adult Health

## 2017-11-24 ENCOUNTER — Other Ambulatory Visit: Payer: Self-pay

## 2017-11-24 ENCOUNTER — Encounter: Payer: Self-pay | Admitting: Advanced Practice Midwife

## 2017-11-24 VITALS — BP 112/74 | HR 78 | Resp 20 | Ht 64.0 in | Wt 264.0 lb

## 2017-11-24 DIAGNOSIS — Z124 Encounter for screening for malignant neoplasm of cervix: Secondary | ICD-10-CM | POA: Insufficient documentation

## 2017-11-24 DIAGNOSIS — Z0000 Encounter for general adult medical examination without abnormal findings: Secondary | ICD-10-CM

## 2017-11-24 DIAGNOSIS — F3132 Bipolar disorder, current episode depressed, moderate: Secondary | ICD-10-CM

## 2017-11-24 DIAGNOSIS — Z01419 Encounter for gynecological examination (general) (routine) without abnormal findings: Secondary | ICD-10-CM

## 2017-11-24 NOTE — Progress Notes (Signed)
Tina Ross 37 y.o.  Vitals:   11/24/17 1008  BP: 112/74  Pulse: 78  Resp: 20     Filed Weights   11/24/17 1008  Weight: 264 lb (119.7 kg)    Past Medical History: Past Medical History:  Diagnosis Date  . Abnormal Pap smear    HPV  . Anemia   . Anxiety   . Arthritis    knees  . Asthma    rarely uses inhaler- last times used was last week  . Bipolar 1 disorder (HCC)   . Bipolar 1 disorder, depressed, moderate (HCC) 10/04/2012  . Bipolar disorder (HCC)   . Carpal tunnel syndrome   . Depression   . Headache(784.0)   . Heart murmur    irregular heartbeat as child-cleared by cardiologist  . Hidradenitis 10/04/2012  . Insomnia   . Nausea and vomiting in pregnancy 10/04/2012  . Post partum depression   . Sleep apnea    Does not use CPAP    Past Surgical History: Past Surgical History:  Procedure Laterality Date  . BRAIN SURGERY  2009  . CARPAL TUNNEL RELEASE Left 12/28/2013   Procedure: CARPAL TUNNEL RELEASE;  Surgeon: Darreld Mclean, MD;  Location: [facility];  Service: Orthopedics;  Laterality: Left;  . CHOLECYSTECTOMY      Family History: Family History  Problem Relation Age of Onset  . Hypertension Mother   . Heart disease Mother   . Mental illness Mother   . Thyroid disease Mother   . Dementia Mother   . Kidney disease Mother   . Cancer Mother        liver  . Hypertension Father   . Diabetes Father   . Heart disease Father   . Cancer Daughter        Bone  . Heart disease Daughter   . Hypertension Maternal Grandmother   . Hypertension Maternal Grandfather   . Diabetes Maternal Grandfather   . Cancer Maternal Grandfather        Prostate, colon, bladder, lung  . Diabetes Paternal Grandmother   . Stroke Paternal Grandmother   . Diabetes Paternal Grandfather   . Cancer Paternal Grandfather        Bone and lung  . Diabetes Paternal Aunt   . Diabetes Paternal Aunt     Social History: Social History   Tobacco Use  . Smoking status: Never Smoker   . Smokeless tobacco: Never Used  Substance Use Topics  . Alcohol use: No    Comment: Occ; not now  . Drug use: No    Allergies: No Known Allergies    Current Outpatient Medications:  .  levonorgestrel (MIRENA) 20 MCG/24HR IUD, 1 each by Intrauterine route once., Disp: , Rfl:  .  naproxen sodium (ALEVE) 220 MG tablet, Take 440 mg by mouth daily as needed (pain)., Disp: , Rfl:  .  traMADol (ULTRAM) 50 MG tablet, Take 1 tablet (50 mg total) by mouth every 6 (six) hours as needed. (Patient not taking: Reported on 11/24/2017), Disp: 15 tablet, Rfl: 0  History of Present Illness: here for pap. Last pap 3 years ago, ASCUS w/HPV. No f/u, "busy with kids". Off bipolar meds d/t prescribing MD "getting arrested for overprescribing."  Wants referal to [facility].  Trouble sleeping, drama w/stepdad's girlfriend, kids, etc.  Has 1 more year of IUD.  Doesn't bleed.  Cramps a lot.    Review of Systems   Patient denies any headaches, blurred vision, shortness of breath, chest pain, abdominal pain,  problems with bowel movements, urination   Physical Exam: General:  Well developed, well nourished, no acute distress Skin:  Warm and dry Neck:  Midline trachea, normal thyroid Lungs; Clear to auscultation bilaterally Breast:  No dominant palpable mass, retraction, or nipple discharge Cardiovascular: Regular rate and rhythm Abdomen:  Soft, non tender, no hepatosplenomegaly Pelvic:  External genitalia is normal in appearance.  The vagina is normal in appearance.  The cervix is bulbous.  Uterus is felt to be normal size, shape, and contour.  No adnexal masses or tenderness noted. Exam limited by habitus Extremities:  No swelling or varicosities noted Psych:  No mood changes.     Impression: normal GYN exam     Plan: If pap anything other than normal w/o HPV, colpo.  Can go to q 3years if HPV cleared/normal Referral to [facility] sent Replace IUD next year

## 2017-11-26 LAB — CYTOLOGY - PAP
ADEQUACY: ABSENT
Chlamydia: NEGATIVE
DIAGNOSIS: NEGATIVE
HPV (WINDOPATH): NOT DETECTED
NEISSERIA GONORRHEA: NEGATIVE

## 2017-12-01 ENCOUNTER — Telehealth: Payer: Self-pay | Admitting: *Deleted

## 2017-12-07 ENCOUNTER — Telehealth: Payer: Self-pay | Admitting: *Deleted

## 2017-12-07 NOTE — Telephone Encounter (Signed)
Behavioral Health called several times with no answer regarding patient's referral status.  Left message for someone to call me back.  Referral was originally sent electronically but will fax over again.  Patient made aware. States she is doing ok at this time.  Informed she could try going out to [facility] in [place] but patient states she would like to go where her daughter goes.

## 2017-12-28 ENCOUNTER — Other Ambulatory Visit (HOSPITAL_COMMUNITY): Payer: Self-pay | Admitting: Internal Medicine

## 2017-12-28 ENCOUNTER — Ambulatory Visit (HOSPITAL_COMMUNITY): Admission: RE | Admit: 2017-12-28 | Payer: Medicaid Other | Source: Ambulatory Visit

## 2017-12-28 ENCOUNTER — Ambulatory Visit (HOSPITAL_COMMUNITY)
Admission: RE | Admit: 2017-12-28 | Discharge: 2017-12-28 | Disposition: A | Discharge status: Home / Self Care | Payer: Medicaid Other | Source: Ambulatory Visit | Attending: Internal Medicine | Admitting: Internal Medicine

## 2017-12-28 DIAGNOSIS — R52 Pain, unspecified: Secondary | ICD-10-CM

## 2017-12-28 DIAGNOSIS — R609 Edema, unspecified: Secondary | ICD-10-CM

## 2017-12-28 DIAGNOSIS — M25572 Pain in left ankle and joints of left foot: Secondary | ICD-10-CM | POA: Diagnosis not present

## 2017-12-28 DIAGNOSIS — M7732 Calcaneal spur, left foot: Secondary | ICD-10-CM | POA: Diagnosis not present

## 2017-12-28 DIAGNOSIS — M7989 Other specified soft tissue disorders: Secondary | ICD-10-CM | POA: Insufficient documentation

## 2017-12-29 ENCOUNTER — Ambulatory Visit (INDEPENDENT_AMBULATORY_CARE_PROVIDER_SITE_OTHER): Payer: Medicaid Other

## 2017-12-29 ENCOUNTER — Ambulatory Visit: Payer: Medicaid Other | Admitting: Orthopaedic Surgery

## 2017-12-29 ENCOUNTER — Encounter: Payer: Self-pay | Admitting: Orthopaedic Surgery

## 2017-12-29 VITALS — BP 136/81 | HR 72 | Ht 64.0 in | Wt 275.0 lb

## 2017-12-29 DIAGNOSIS — M25561 Pain in right knee: Secondary | ICD-10-CM

## 2017-12-29 MED ORDER — HYDROCODONE-ACETAMINOPHEN 5-325 MG PO TABS: ORAL_TABLET | ORAL | 0 refills | Status: DC

## 2017-12-29 NOTE — Progress Notes (Signed)
Patient Tina Ross Thomos Lemons, female DOB:01-24-1981, 37 y.o. UJW:[id number]  Chief Complaint  Patient presents with  . New Problem    Left Foot and ankle pain, Right knee    HPI  Tina HILMES is a 37 y.o. female who fell and hurt her left ankle and foot and the right knee yesterday.  She was seen in the ER.  X-rays were done of the left foot and ankle and were negative.  During the night the right knee had some swelling and large bruises appeared.  She is very concerned about the right knee today.  She limps.  She has used ice.  She has used Advil. HPI  Body mass index is 47.2 kg/m.  ROS  Review of Systems  HENT: Negative for congestion.   Respiratory: Negative for cough and shortness of breath.   Cardiovascular: Negative for chest pain and leg swelling.  Endocrine: Negative for cold intolerance.  Musculoskeletal: Positive for arthralgias, back pain, gait problem, joint swelling and myalgias.  Allergic/Immunologic: Negative for environmental allergies.    Past Medical History:  Diagnosis Date  . Abnormal Pap smear    HPV  . Anemia   . Anxiety   . Arthritis    knees  . Asthma    rarely uses inhaler- last times used was last week  . Bipolar 1 disorder (HCC)   . Bipolar 1 disorder, depressed, moderate (HCC) 10/04/2012  . Bipolar disorder (HCC)   . Carpal tunnel syndrome   . Depression   . Headache(784.0)   . Heart murmur    irregular heartbeat as child-cleared by cardiologist  . Hidradenitis 10/04/2012  . Insomnia   . Nausea and vomiting in pregnancy 10/04/2012  . Post partum depression   . Sleep apnea    Does not use CPAP    Past Surgical History:  Procedure Laterality Date  . BRAIN SURGERY  2009  . CARPAL TUNNEL RELEASE Left 12/28/2013   Procedure: CARPAL TUNNEL RELEASE;  Surgeon: Darreld Mclean, MD;  Location: [facility];  Service: Orthopedics;  Laterality: Left;  . CHOLECYSTECTOMY      Family History  Problem Relation Age of Onset  . Hypertension Mother   .  Heart disease Mother   . Mental illness Mother   . Thyroid disease Mother   . Dementia Mother   . Kidney disease Mother   . Cancer Mother        liver  . Hypertension Father   . Diabetes Father   . Heart disease Father   . Cancer Daughter        Bone  . Heart disease Daughter   . Hypertension Maternal Grandmother   . Hypertension Maternal Grandfather   . Diabetes Maternal Grandfather   . Cancer Maternal Grandfather        Prostate, colon, bladder, lung  . Diabetes Paternal Grandmother   . Stroke Paternal Grandmother   . Diabetes Paternal Grandfather   . Cancer Paternal Grandfather        Bone and lung  . Diabetes Paternal Aunt   . Diabetes Paternal Aunt     Social History Social History   Tobacco Use  . Smoking status: Never Smoker  . Smokeless tobacco: Never Used  Substance Use Topics  . Alcohol use: No    Comment: Occ; not now  . Drug use: No    No Known Allergies  Current Outpatient Medications  Medication Sig Dispense Refill  . HYDROcodone-acetaminophen (NORCO/VICODIN) 5-325 MG tablet One tablet every four hours for  pain. 5 day limit acute pain. 30 tablet 0  . levonorgestrel (MIRENA) 20 MCG/24HR IUD 1 each by Intrauterine route once.    . naproxen sodium (ALEVE) 220 MG tablet Take 440 mg by mouth daily as needed (pain).     No current facility-administered medications for this visit.      Physical Exam  Blood pressure 136/81, pulse 72, height 5\' 4"  (1.626 m), weight 275 lb (124.7 kg).  Constitutional: overall normal hygiene, normal nutrition, well developed, normal grooming, normal body habitus. Assistive device:none  Musculoskeletal: gait and station Limp right, muscle tone and strength are normal, no tremors or atrophy is present.  .  Neurological: coordination overall normal.  Deep tendon reflex/nerve stretch intact.  Sensation normal.  Cranial nerves II-XII intact.   Skin:   Normal overall no scars, lesions, ulcers or rashes. No  psoriasis.  Psychiatric: Alert and oriented x 3.  Recent memory intact, remote memory unclear.  Normal mood and affect. Well groomed.  Good eye contact.  Cardiovascular: overall no swelling, no varicosities, no edema bilaterally, normal temperatures of the legs and arms, no clubbing, cyanosis and good capillary refill.  Lymphatic: palpation is normal.  Right knee has effusion 1+, ROM 0 to 110, she has large anterior lateral ecchymosis and ecchymosis posterior and medial.  NV intact.  The knee is stable.  She has no crepitus.  Limp to the right.  The left foot and ankle are tender with slight lateral swelling, full ROM, NV intact. All other systems reviewed and are negative   The patient has been educated about the nature of the problem(s) and counseled on treatment options.  The patient appeared to understand what I have discussed and is in agreement with it.  Encounter Diagnosis  Name Primary?  . Acute pain of right knee Yes   X-rays were done of the right knee, reported separately.  PLAN Call if any problems.  Precautions discussed.  Continue current medications.   Return to clinic 2 weeks   I [facility] [place] [facility] [place] [facility] [place] web site prior to prescribing narcotic medicine for this patient. Electronically Signed Darreld McleanWayne Aunica Dauphinee, MD 6/19/201910:21 AM

## 2017-12-29 NOTE — Patient Instructions (Signed)

## 2018-01-12 ENCOUNTER — Ambulatory Visit: Payer: Medicaid Other | Admitting: Orthopaedic Surgery

## 2018-01-12 ENCOUNTER — Encounter: Payer: Self-pay | Admitting: Orthopaedic Surgery

## 2018-02-17 ENCOUNTER — Ambulatory Visit: Payer: Medicaid Other | Admitting: Orthopaedic Surgery

## 2018-02-24 ENCOUNTER — Ambulatory Visit: Payer: Medicaid Other | Admitting: Orthopaedic Surgery

## 2018-04-04 NOTE — Progress Notes (Deleted)
Psychiatric Initial Adult Assessment   Patient Identification: Tina GauzeJennifer A Callaham MRN:  [id number] Date of Evaluation:  04/04/2018 Referral Source: *** Chief Complaint:   Visit Diagnosis: No diagnosis found.  History of Present Illness:   Tina GauzeJennifer A Dada is a 37 y.o. year old female with a history of bipolar disorder per chart, sleep apnea, asthma, heart murmr, who is referred for    Associated Signs/Symptoms: Depression Symptoms:  {DEPRESSION SYMPTOMS:20000} (Hypo) Manic Symptoms:  {BHH MANIC SYMPTOMS:22872} Anxiety Symptoms:  {BHH ANXIETY SYMPTOMS:22873} Psychotic Symptoms:  {BHH PSYCHOTIC SYMPTOMS:22874} PTSD Symptoms: {BHH PTSD SYMPTOMS:22875}  Past Psychiatric History:  Outpatient:  Psychiatry admission:  Previous suicide attempt:  Past trials of medication:  History of violence:   Previous Psychotropic Medications: {YES/NO:21197}  Substance Abuse History in the last 12 months:  {yes no:314532}  Consequences of Substance Abuse: {BHH CONSEQUENCES OF SUBSTANCE ABUSE:22880}  Past Medical History:  Past Medical History:  Diagnosis Date  . Abnormal Pap smear    HPV  . Anemia   . Anxiety   . Arthritis    knees  . Asthma    rarely uses inhaler- last times used was last week  . Bipolar 1 disorder (HCC)   . Bipolar 1 disorder, depressed, moderate (HCC) 10/04/2012  . Bipolar disorder (HCC)   . Carpal tunnel syndrome   . Depression   . Headache(784.0)   . Heart murmur    irregular heartbeat as child-cleared by cardiologist  . Hidradenitis 10/04/2012  . Insomnia   . Nausea and vomiting in pregnancy 10/04/2012  . Post partum depression   . Sleep apnea    Does not use CPAP    Past Surgical History:  Procedure Laterality Date  . BRAIN SURGERY  2009  . CARPAL TUNNEL RELEASE Left 12/28/2013   Procedure: CARPAL TUNNEL RELEASE;  Surgeon: Darreld McleanWayne Keeling, MD;  Location: [facility];  Service: Orthopedics;  Laterality: Left;  . CHOLECYSTECTOMY      Family Psychiatric History:  ***  Family History:  Family History  Problem Relation Age of Onset  . Hypertension Mother   . Heart disease Mother   . Mental illness Mother   . Thyroid disease Mother   . Dementia Mother   . Kidney disease Mother   . Cancer Mother        liver  . Hypertension Father   . Diabetes Father   . Heart disease Father   . Cancer Daughter        Bone  . Heart disease Daughter   . Hypertension Maternal Grandmother   . Hypertension Maternal Grandfather   . Diabetes Maternal Grandfather   . Cancer Maternal Grandfather        Prostate, colon, bladder, lung  . Diabetes Paternal Grandmother   . Stroke Paternal Grandmother   . Diabetes Paternal Grandfather   . Cancer Paternal Grandfather        Bone and lung  . Diabetes Paternal Aunt   . Diabetes Paternal Aunt     Social History:   Social History   Socioeconomic History  . Marital status: Single    Spouse name: Not on file  . Number of children: Not on file  . Years of education: Not on file  . Highest education level: Not on file  Occupational History  . Not on file  Social Needs  . Financial resource strain: Not on file  . Food insecurity:    Worry: Not on file    Inability: Not on file  . Transportation needs:  Medical: Not on file    Non-medical: Not on file  Tobacco Use  . Smoking status: Never Smoker  . Smokeless tobacco: Never Used  Substance and Sexual Activity  . Alcohol use: No    Comment: Occ; not now  . Drug use: No  . Sexual activity: Yes    Birth control/protection: IUD  Lifestyle  . Physical activity:    Days per week: Not on file    Minutes per session: Not on file  . Stress: Not on file  Relationships  . Social connections:    Talks on phone: Not on file    Gets together: Not on file    Attends religious service: Not on file    Active member of club or organization: Not on file    Attends meetings of clubs or organizations: Not on file    Relationship status: Not on file  Other Topics  Concern  . Not on file  Social History Narrative  . Not on file    Additional Social History: ***  Allergies:  No Known Allergies  Metabolic Disorder Labs: Lab Results  Component Value Date   HGBA1C 5.1 08/08/2017   MPG 99.67 08/08/2017   No results found for: PROLACTIN Lab Results  Component Value Date   CHOL 152 08/09/2017   TRIG 608 (H) 08/09/2017   HDL 24 (L) 08/09/2017   CHOLHDL 6.3 08/09/2017   VLDL UNABLE TO CALCULATE IF TRIGLYCERIDE OVER 400 mg/dL 16/04/9603   LDLCALC UNABLE TO CALCULATE IF TRIGLYCERIDE OVER 400 mg/dL 54/03/8118     Current Medications: Current Outpatient Medications  Medication Sig Dispense Refill  . HYDROcodone-acetaminophen (NORCO/VICODIN) 5-325 MG tablet One tablet every four hours for pain. 5 day limit acute pain. 30 tablet 0  . levonorgestrel (MIRENA) 20 MCG/24HR IUD 1 each by Intrauterine route once.    . naproxen sodium (ALEVE) 220 MG tablet Take 440 mg by mouth daily as needed (pain).     No current facility-administered medications for this visit.     Neurologic: Headache: No Seizure: No Paresthesias:No  Musculoskeletal: Strength & Muscle Tone: within normal limits Gait & Station: normal Patient leans: N/A  Psychiatric Specialty Exam: ROS  There were no vitals taken for this visit.There is no height or weight on file to calculate BMI.  General Appearance: Fairly Groomed  Eye Contact:  Good  Speech:  Clear and Coherent  Volume:  Normal  Mood:  {BHH MOOD:22306}  Affect:  {Affect (PAA):22687}  Thought Process:  Coherent  Orientation:  Full (Time, Place, and Person)  Thought Content:  Logical  Suicidal Thoughts:  {ST/HT (PAA):22692}  Homicidal Thoughts:  {ST/HT (PAA):22692}  Memory:  Immediate;   Good  Judgement:  {Judgement (PAA):22694}  Insight:  {Insight (PAA):22695}  Psychomotor Activity:  Normal  Concentration:  Concentration: Good and Attention Span: Good  Recall:  Good  Fund of Knowledge:Good  Language: Good   Akathisia:  No  Handed:  Right  AIMS (if indicated):  N/A  Assets:  Communication Skills Desire for Improvement  ADL's:  Intact  Cognition: WNL  Sleep:  ***   Assessment  Plan  The patient demonstrates the following risk factors for suicide: Chronic risk factors for suicide include: {Chronic Risk Factors for JYNWGNF:62130865}. Acute risk factors for suicide include: {Acute Risk Factors for HQIONGE:95284132}. Protective factors for this patient include: {Protective Factors for Suicide GMWN:02725366}. Considering these factors, the overall suicide risk at this point appears to be {Desc; low/moderate/high:110033}. Patient {ACTION; IS/IS YQI:34742595} appropriate for outpatient follow  up.   Treatment Plan Summary: Plan as above   Neysa Hotter, MD 9/23/201910:59 AM

## 2018-04-06 ENCOUNTER — Ambulatory Visit (HOSPITAL_COMMUNITY): Payer: Medicaid Other | Admitting: Psychiatry

## 2018-04-23 ENCOUNTER — Encounter (HOSPITAL_COMMUNITY): Payer: Self-pay | Admitting: Emergency Medicine

## 2018-04-23 ENCOUNTER — Other Ambulatory Visit: Payer: Self-pay

## 2018-04-23 ENCOUNTER — Emergency Department (HOSPITAL_COMMUNITY)
Admission: EM | Admit: 2018-04-23 | Discharge: 2018-04-24 | Disposition: A | Discharge status: Home / Self Care | Payer: Medicaid Other | Attending: Emergency Medicine | Admitting: Emergency Medicine

## 2018-04-23 ENCOUNTER — Emergency Department (HOSPITAL_COMMUNITY): Payer: Medicaid Other

## 2018-04-23 DIAGNOSIS — R0789 Other chest pain: Secondary | ICD-10-CM

## 2018-04-23 DIAGNOSIS — J45909 Unspecified asthma, uncomplicated: Secondary | ICD-10-CM | POA: Insufficient documentation

## 2018-04-23 LAB — BASIC METABOLIC PANEL
Anion gap: 6 (ref 5–15)
BUN: 14 mg/dL (ref 6–20)
CO2: 27 mmol/L (ref 22–32)
CREATININE: 0.77 mg/dL (ref 0.44–1.00)
Calcium: 9.1 mg/dL (ref 8.9–10.3)
Chloride: 106 mmol/L (ref 98–111)
GFR calc non Af Amer: >60 mL/min (ref >60)
Glucose, Bld: 89 mg/dL (ref 70–99)
Potassium: 3.1 mmol/L — ABNORMAL LOW (ref 3.5–5.1)
SODIUM: 139 mmol/L (ref 135–145)

## 2018-04-23 LAB — CBC
HCT: 42.8 % (ref 36.0–46.0)
Hemoglobin: 13.3 g/dL (ref 12.0–15.0)
MCH: 29.4 pg (ref 26.0–34.0)
MCHC: 31.1 g/dL (ref 30.0–36.0)
MCV: 94.7 fL (ref 80.0–100.0)
NRBC: 0 % (ref 0.0–0.2)
Platelets: 260 10*3/uL (ref 150–400)
RBC: 4.52 MIL/uL (ref 3.87–5.11)
RDW: 13.8 % (ref 11.5–15.5)
WBC: 12.9 10*3/uL — AB (ref 4.0–10.5)

## 2018-04-23 LAB — I-STAT BETA HCG BLOOD, ED (MC, WL, AP ONLY): I-stat hCG, quantitative: <5 m[IU]/mL (ref <5)

## 2018-04-23 LAB — I-STAT TROPONIN, ED: Troponin i, poc: 0 ng/mL (ref 0.00–0.08)

## 2018-04-23 LAB — D-DIMER, QUANTITATIVE (NOT AT ARMC): D DIMER QUANT: 0.38 ug{FEU}/mL (ref 0.00–0.50)

## 2018-04-23 MED ORDER — MORPHINE SULFATE (PF) 4 MG/ML IV SOLN
4.0000 mg | Freq: Once | INTRAVENOUS | Status: AC
  Administered 2018-04-23: 4 mg via INTRAVENOUS
  Filled 2018-04-23: qty 1

## 2018-04-23 MED ORDER — KETOROLAC TROMETHAMINE 30 MG/ML IJ SOLN
30.0000 mg | Freq: Once | INTRAMUSCULAR | Status: AC
  Administered 2018-04-23: 30 mg via INTRAVENOUS
  Filled 2018-04-23: qty 1

## 2018-04-23 MED ORDER — GI COCKTAIL ~~LOC~~
30.0000 mL | Freq: Once | ORAL | Status: AC
  Administered 2018-04-23: 30 mL via ORAL
  Filled 2018-04-23: qty 30

## 2018-04-23 MED ORDER — POTASSIUM CHLORIDE CRYS ER 20 MEQ PO TBCR
40.0000 meq | EXTENDED_RELEASE_TABLET | Freq: Once | ORAL | Status: AC
  Administered 2018-04-23: 40 meq via ORAL
  Filled 2018-04-23: qty 2

## 2018-04-23 NOTE — ED Provider Notes (Signed)
[facility] EMERGENCY DEPARTMENT Provider Note   CSN: [id number] Arrival date & time: 04/23/18  2229     History   Chief Complaint Chief Complaint  Patient presents with  . Chest Pain    HPI Tina Ross is a 37 y.o. female.  Patient with history of asthma, bipolar disorder, morbid obesity presenting with central and left-sided chest pain that onset around 9 PM while she was lying in bed.  Reports this pain is been constant since then and radiates to her left arm.  It is worse with lying flat and worse with palpation.  This is similar to when she was seen in the hospital in January and was admitted without a definitive diagnosis.  She denies any shortness of breath, cough, fever.  Interestingly she reports 3 episodes of nausea and vomiting when she reported none to the nurse.  Denies any abdominal pain or back pain.  Denies any pain with urination or blood in the urine.  She does take Mirena birth control.  Denies any recent cough, fever, upper respiratory symptoms, leg pain or swelling.  She denies any pain in her left arm or weakness but does have numbness and tingling in her left arm and pain to her mid chest and underneath her left breast.  The history is provided by the patient.  Chest Pain   Associated symptoms include nausea, shortness of breath and vomiting. Pertinent negatives include no dizziness, no fever, no headaches and no weakness.    Past Medical History:  Diagnosis Date  . Abnormal Pap smear    HPV  . Anemia   . Anxiety   . Arthritis    knees  . Asthma    rarely uses inhaler- last times used was last week  . Bipolar 1 disorder (HCC)   . Bipolar 1 disorder, depressed, moderate (HCC) 10/04/2012  . Bipolar disorder (HCC)   . Carpal tunnel syndrome   . Depression   . Headache(784.0)   . Heart murmur    irregular heartbeat as child-cleared by cardiologist  . Hidradenitis 10/04/2012  . Insomnia   . Nausea and vomiting in pregnancy 10/04/2012  . Post partum  depression   . Sleep apnea    Does not use CPAP    Patient Active Problem List   Diagnosis Date Noted  . Hypertriglyceridemia 08/09/2017  . Atypical chest pain   . Chest pain 08/08/2017  . Sleep apnea 08/08/2017  . Insomnia 08/08/2017  . Asthma 08/08/2017  . Abnormal EKG 08/08/2017  . Boil, vulva 02/07/2014  . Encounter for IUD insertion 01/18/2014  . Depression with anxiety 12/26/2012  . Abnormal cervical Pap smear with positive HPV DNA test 11/03/2012  . Bipolar 1 disorder, depressed, moderate (HCC) 10/04/2012  . Hidradenitis 10/04/2012    Past Surgical History:  Procedure Laterality Date  . BRAIN SURGERY  2009  . CARPAL TUNNEL RELEASE Left 12/28/2013   Procedure: CARPAL TUNNEL RELEASE;  Surgeon: Darreld Mclean, MD;  Location: [facility];  Service: Orthopedics;  Laterality: Left;  . CHOLECYSTECTOMY       OB History    Gravida  4   Para  3   Term  3   Preterm      AB  1   Living  3     SAB  1   TAB      Ectopic      Multiple      Live Births  3  Home Medications    Prior to Admission medications   Medication Sig Start Date End Date Taking? Authorizing Provider  aspirin 81 MG chewable tablet Chew 324 mg by mouth once as needed.   Yes [provider]  levonorgestrel (MIRENA) 20 MCG/24HR IUD 1 each by Intrauterine route once.   Yes [provider]    Family History Family History  Problem Relation Age of Onset  . Hypertension Mother   . Heart disease Mother   . Mental illness Mother   . Thyroid disease Mother   . Dementia Mother   . Kidney disease Mother   . Cancer Mother        liver  . Hypertension Father   . Diabetes Father   . Heart disease Father   . Cancer Daughter        Bone  . Heart disease Daughter   . Hypertension Maternal Grandmother   . Hypertension Maternal Grandfather   . Diabetes Maternal Grandfather   . Cancer Maternal Grandfather        Prostate, colon, bladder, lung  . Diabetes Paternal  Grandmother   . Stroke Paternal Grandmother   . Diabetes Paternal Grandfather   . Cancer Paternal Grandfather        Bone and lung  . Diabetes Paternal Aunt   . Diabetes Paternal Aunt     Social History Social History   Tobacco Use  . Smoking status: Never Smoker  . Smokeless tobacco: Never Used  Substance Use Topics  . Alcohol use: No    Comment: Occ; not now  . Drug use: No     Allergies   Patient has no known allergies.   Review of Systems Review of Systems  Constitutional: Negative for activity change, appetite change and fever.  HENT: Negative for congestion and rhinorrhea.   Eyes: Negative for visual disturbance.  Respiratory: Positive for chest tightness and shortness of breath.   Cardiovascular: Positive for chest pain.  Gastrointestinal: Positive for nausea and vomiting.  Genitourinary: Negative for dysuria and hematuria.  Musculoskeletal: Negative for arthralgias and myalgias.  Skin: Negative for rash.  Neurological: Negative for dizziness, weakness and headaches.    all other systems are negative except as noted in the HPI and PMH.    Physical Exam Updated Vital Signs BP (!) 124/92   Temp 98.2 F (36.8 C) (Oral)   Resp 17   Wt 124 kg   SpO2 96%   BMI 46.92 kg/m   Physical Exam  Constitutional: She is oriented to person, place, and time. She appears well-developed and well-nourished. No distress.  HENT:  Head: Normocephalic and atraumatic.  Mouth/Throat: Oropharynx is clear and moist. No oropharyngeal exudate.  Eyes: Pupils are equal, round, and reactive to light. Conjunctivae and EOM are normal.  Neck: Normal range of motion. Neck supple.  No meningismus.  Cardiovascular: Normal rate, regular rhythm, normal heart sounds and intact distal pulses.  No murmur heard. Pulmonary/Chest: Effort normal and breath sounds normal. No respiratory distress. She exhibits tenderness.  Left sternal border tenderness that extends underneath left breast.  Worse  with palpation and lying flat.  Abdominal: Soft. There is no tenderness. There is no rebound and no guarding.  Musculoskeletal: Normal range of motion. She exhibits no edema or tenderness.  Neurological: She is alert and oriented to person, place, and time. No cranial nerve deficit. She exhibits normal muscle tone. Coordination normal.  No ataxia on finger to nose bilaterally. No pronator drift. 5/5 strength throughout. CN 2-12  intact.Equal grip strength. Sensation intact.   Skin: Skin is warm.  Psychiatric: She has a normal mood and affect. Her behavior is normal.  Nursing note and vitals reviewed.    ED Treatments / Results  Labs (all labs ordered are listed, but only abnormal results are displayed) Labs Reviewed  BASIC METABOLIC PANEL - Abnormal; Notable for the following components:      Result Value   Potassium 3.1 (*)    All other components within normal limits  CBC - Abnormal; Notable for the following components:   WBC 12.9 (*)    All other components within normal limits  D-DIMER, QUANTITATIVE (NOT AT [facility])  I-STAT TROPONIN, ED  I-STAT BETA HCG BLOOD, ED ([facility], [facility], [facility] ONLY)  I-STAT TROPONIN, ED    EKG EKG Interpretation  Date/Time:  Saturday April 23 2018 22:34:16 EDT Ventricular Rate:  85 PR Interval:    QRS Duration: 89 QT Interval:  379 QTC Calculation: 451 R Axis:   5 Text Interpretation:  Sinus rhythm lead III TWI similar to previous Confirmed by Glynn Octave [phone]) on 04/23/2018 10:54:34 PM   Radiology Dg Chest 2 View  Result Date: 04/23/2018 CLINICAL DATA:  Acute onset chest pain 1 hour ago. Lightheadedness. Asthma. EXAM: CHEST - 2 VIEW COMPARISON:  08/08/2017 FINDINGS: The heart size and mediastinal contours are within normal limits. Both lungs are clear. The visualized skeletal structures are unremarkable. IMPRESSION: No active cardiopulmonary disease. Electronically Signed   By: Myles Rosenthal M.D.   On: 04/23/2018 23:25    Procedures Procedures  (including critical care time)  Medications Ordered in ED Medications  morphine 4 MG/ML injection 4 mg (has no administration in time range)  ketorolac (TORADOL) 30 MG/ML injection 30 mg (has no administration in time range)  gi cocktail (Maalox,Lidocaine,Donnatal) (has no administration in time range)  potassium chloride SA (K-DUR,KLOR-CON) CR tablet 40 mEq (has no administration in time range)     Initial Impression / Assessment and Plan / ED Course  I have reviewed the triage vital signs and the nursing notes.  Pertinent labs & imaging results that were available during my care of the patient were reviewed by me and considered in my medical decision making (see chart for details).    2 hours of constant left-sided chest pain that is worse with palpation and lying flat.  Did have several episodes of vomiting.  Pain similar to when she was admitted in January.  EKG shows T wave inversions in lead III which is similar to previous as well as anterior T wave flattening.  Cardiology consult from Dr. Wyline Mood of August 09, 2017 reviewed.  Chest pain at this point was thought not to be cardiac.  Patient's troponins remain negative and her echocardiogram was normal. There was consideration to outpatient stress test but this has not been done yet.   Pain is somewhat improved after GI cocktail and Toradol. Pain appears atypical for ACS and is reproducible and worse with palpation and lying flat.  D-dimer is negative.  Troponin negative x2 with more than 4 hours of ongoing pain.  Low suspicion for ACS, pulmonary embolism, aortic dissection.  Discussed with patient that she is low risk for cardiac type chest pain but not 0 risk.  She should follow-up with her cardiologist for a stress test.  Return to the ED if her chest pain becomes exertional, associated with shortness of breath, diaphoresis, syncope, vomiting or any concerns.  Final Clinical Impressions(s) / ED Diagnoses   Final diagnoses:  Atypical chest pain    ED Discharge Orders    None       Iyanna Drummer, Jeannett Senior, MD 04/24/18 [phone]

## 2018-04-23 NOTE — ED Triage Notes (Signed)
Pt C/O chest pain that began around 45 minutes ago. Pt C/O lightheadedness. Pt denies N/V. Pt received 324 asa en route via EMS.

## 2018-04-24 LAB — I-STAT TROPONIN, ED: TROPONIN I, POC: 0 ng/mL (ref 0.00–0.08)

## 2018-04-24 MED ORDER — IBUPROFEN 600 MG PO TABS: 600.0000 mg | ORAL_TABLET | Freq: Four times a day (QID) | ORAL | 0 refills | Status: DC | PRN

## 2018-04-24 MED ORDER — FAMOTIDINE IN NACL 20-0.9 MG/50ML-% IV SOLN
20.0000 mg | Freq: Once | INTRAVENOUS | Status: AC
  Administered 2018-04-24: 20 mg via INTRAVENOUS
  Filled 2018-04-24: qty 50

## 2018-04-24 NOTE — Discharge Instructions (Signed)
Your testing today does not suggest a heart attack or blood clot in your lung.  As we discussed, you are low risk for heart attack but not 0 risk.  You should follow-up with your cardiologist for a stress test.  Return to the ED if your chest pain becomes worse, associated with shortness of breath, sweating, vomiting, worse with exertion or any other concerns.

## 2018-07-29 ENCOUNTER — Other Ambulatory Visit: Payer: Self-pay

## 2018-07-29 ENCOUNTER — Emergency Department (HOSPITAL_COMMUNITY)
Admission: EM | Admit: 2018-07-29 | Discharge: 2018-07-30 | Disposition: A | Discharge status: Home / Self Care | Payer: Medicaid Other | Attending: Emergency Medicine | Admitting: Emergency Medicine

## 2018-07-29 ENCOUNTER — Encounter (HOSPITAL_COMMUNITY): Payer: Self-pay

## 2018-07-29 DIAGNOSIS — Z7982 Long term (current) use of aspirin: Secondary | ICD-10-CM | POA: Insufficient documentation

## 2018-07-29 DIAGNOSIS — Z79899 Other long term (current) drug therapy: Secondary | ICD-10-CM | POA: Insufficient documentation

## 2018-07-29 DIAGNOSIS — F1410 Cocaine abuse, uncomplicated: Secondary | ICD-10-CM

## 2018-07-29 DIAGNOSIS — Y906 Blood alcohol level of 120-199 mg/100 ml: Secondary | ICD-10-CM | POA: Diagnosis not present

## 2018-07-29 DIAGNOSIS — F10920 Alcohol use, unspecified with intoxication, uncomplicated: Secondary | ICD-10-CM | POA: Diagnosis not present

## 2018-07-29 DIAGNOSIS — J45909 Unspecified asthma, uncomplicated: Secondary | ICD-10-CM | POA: Insufficient documentation

## 2018-07-29 DIAGNOSIS — F333 Major depressive disorder, recurrent, severe with psychotic symptoms: Secondary | ICD-10-CM | POA: Insufficient documentation

## 2018-07-29 DIAGNOSIS — F329 Major depressive disorder, single episode, unspecified: Secondary | ICD-10-CM | POA: Diagnosis present

## 2018-07-29 LAB — CBC WITH DIFFERENTIAL/PLATELET
ABS IMMATURE GRANULOCYTES: 0.07 10*3/uL (ref 0.00–0.07)
BASOS PCT: 0 %
Basophils Absolute: 0 10*3/uL (ref 0.0–0.1)
Eosinophils Absolute: 0 10*3/uL (ref 0.0–0.5)
Eosinophils Relative: 0 %
HCT: 46.1 % — ABNORMAL HIGH (ref 36.0–46.0)
HEMOGLOBIN: 14.4 g/dL (ref 12.0–15.0)
Immature Granulocytes: 1 %
Lymphocytes Relative: 8 %
Lymphs Abs: 1.2 10*3/uL (ref 0.7–4.0)
MCH: 30.1 pg (ref 26.0–34.0)
MCHC: 31.2 g/dL (ref 30.0–36.0)
MCV: 96.2 fL (ref 80.0–100.0)
MONO ABS: 0.5 10*3/uL (ref 0.1–1.0)
MONOS PCT: 4 %
NEUTROS ABS: 13.2 10*3/uL — AB (ref 1.7–7.7)
Neutrophils Relative %: 87 %
PLATELETS: 274 10*3/uL (ref 150–400)
RBC: 4.79 MIL/uL (ref 3.87–5.11)
RDW: 13 % (ref 11.5–15.5)
WBC: 15 10*3/uL — ABNORMAL HIGH (ref 4.0–10.5)
nRBC: 0 % (ref 0.0–0.2)

## 2018-07-29 LAB — RAPID URINE DRUG SCREEN, HOSP PERFORMED
Amphetamines: NOT DETECTED
Barbiturates: NOT DETECTED
Benzodiazepines: NOT DETECTED
COCAINE: POSITIVE — AB
OPIATES: NOT DETECTED
Tetrahydrocannabinol: NOT DETECTED

## 2018-07-29 LAB — COMPREHENSIVE METABOLIC PANEL
ALK PHOS: 54 U/L (ref 38–126)
ALT: 24 U/L (ref 0–44)
AST: 20 U/L (ref 15–41)
Albumin: 4.5 g/dL (ref 3.5–5.0)
Anion gap: 14 (ref 5–15)
BUN: 11 mg/dL (ref 6–20)
CALCIUM: 9.1 mg/dL (ref 8.9–10.3)
CO2: 19 mmol/L — ABNORMAL LOW (ref 22–32)
CREATININE: 0.5 mg/dL (ref 0.44–1.00)
Chloride: 104 mmol/L (ref 98–111)
GFR calc non Af Amer: >60 mL/min (ref >60)
Glucose, Bld: 97 mg/dL (ref 70–99)
Potassium: 3.5 mmol/L (ref 3.5–5.1)
Sodium: 137 mmol/L (ref 135–145)
Total Bilirubin: 0.6 mg/dL (ref 0.3–1.2)
Total Protein: 8.3 g/dL — ABNORMAL HIGH (ref 6.5–8.1)

## 2018-07-29 LAB — POC URINE PREG, ED: Preg Test, Ur: NEGATIVE

## 2018-07-29 MED ORDER — ALUM & MAG HYDROXIDE-SIMETH 200-200-20 MG/5ML PO SUSP: 30.0000 mL | Freq: Four times a day (QID) | ORAL | Status: DC | PRN

## 2018-07-29 MED ORDER — ONDANSETRON HCL 4 MG PO TABS: 4.0000 mg | ORAL_TABLET | Freq: Three times a day (TID) | ORAL | Status: DC | PRN

## 2018-07-29 MED ORDER — IBUPROFEN 400 MG PO TABS
600.0000 mg | ORAL_TABLET | Freq: Three times a day (TID) | ORAL | Status: DC | PRN
  Administered 2018-07-30: 600 mg via ORAL
  Filled 2018-07-29: qty 2

## 2018-07-29 NOTE — ED Provider Notes (Signed)
[facility] EMERGENCY DEPARTMENT Provider Note   CSN: [id number] Arrival date & time: 07/29/18  2217     History   Chief Complaint Chief Complaint  Patient presents with  . V70.1    HPI Tina Ross is a 38 y.o. female.  The history is provided by the patient.  She has history of bipolar disorder, asthma, sleep apnea and comes in stating she is depressed and does not want to live.  She states she has been depressed since July when her boyfriend left and took her son.  She also states that her psychiatrist took her off of her medications.  She admits to crying spells and early morning wakening and anhedonia.  She states she has not been out of bed for 2 weeks.  She states she does not want a live, but does not have active suicidal plan.  She denies homicidal ideation.  She will not answer when I ask if she is having hallucinations.  She does admit to drinking a large amount of alcohol tonight and also using crack cocaine.  She denies other drug use.  Past Medical History:  Diagnosis Date  . Abnormal Pap smear    HPV  . Anemia   . Anxiety   . Arthritis    knees  . Asthma    rarely uses inhaler- last times used was last week  . Bipolar 1 disorder (HCC)   . Bipolar 1 disorder, depressed, moderate (HCC) 10/04/2012  . Bipolar disorder (HCC)   . Carpal tunnel syndrome   . Depression   . Headache(784.0)   . Heart murmur    irregular heartbeat as child-cleared by cardiologist  . Hidradenitis 10/04/2012  . Insomnia   . Nausea and vomiting in pregnancy 10/04/2012  . Post partum depression   . Sleep apnea    Does not use CPAP    Patient Active Problem List   Diagnosis Date Noted  . Hypertriglyceridemia 08/09/2017  . Atypical chest pain   . Chest pain 08/08/2017  . Sleep apnea 08/08/2017  . Insomnia 08/08/2017  . Asthma 08/08/2017  . Abnormal EKG 08/08/2017  . Boil, vulva 02/07/2014  . Encounter for IUD insertion 01/18/2014  . Depression with anxiety 12/26/2012  .  Abnormal cervical Pap smear with positive HPV DNA test 11/03/2012  . Bipolar 1 disorder, depressed, moderate (HCC) 10/04/2012  . Hidradenitis 10/04/2012    Past Surgical History:  Procedure Laterality Date  . BRAIN SURGERY  2009  . CARPAL TUNNEL RELEASE Left 12/28/2013   Procedure: CARPAL TUNNEL RELEASE;  Surgeon: Darreld Mclean, MD;  Location: [facility];  Service: Orthopedics;  Laterality: Left;  . CHOLECYSTECTOMY       OB History    Gravida  4   Para  3   Term  3   Preterm      AB  1   Living  3     SAB  1   TAB      Ectopic      Multiple      Live Births  3            Home Medications    Prior to Admission medications   Medication Sig Start Date End Date Taking? Authorizing Provider  aspirin 81 MG chewable tablet Chew 324 mg by mouth once as needed.    [provider]  ibuprofen (ADVIL,MOTRIN) 600 MG tablet Take 1 tablet (600 mg total) by mouth every 6 (six) hours as needed. 04/24/18   Rancour,  Jeannett Senior, MD  levonorgestrel (MIRENA) 20 MCG/24HR IUD 1 each by Intrauterine route once.    [provider]    Family History Family History  Problem Relation Age of Onset  . Hypertension Mother   . Heart disease Mother   . Mental illness Mother   . Thyroid disease Mother   . Dementia Mother   . Kidney disease Mother   . Cancer Mother        liver  . Hypertension Father   . Diabetes Father   . Heart disease Father   . Cancer Daughter        Bone  . Heart disease Daughter   . Hypertension Maternal Grandmother   . Hypertension Maternal Grandfather   . Diabetes Maternal Grandfather   . Cancer Maternal Grandfather        Prostate, colon, bladder, lung  . Diabetes Paternal Grandmother   . Stroke Paternal Grandmother   . Diabetes Paternal Grandfather   . Cancer Paternal Grandfather        Bone and lung  . Diabetes Paternal Aunt   . Diabetes Paternal Aunt     Social History Social History   Tobacco Use  . Smoking status: Never  Smoker  . Smokeless tobacco: Never Used  Substance Use Topics  . Alcohol use: No    Comment: Occ; not now  . Drug use: No     Allergies   Bee venom   Review of Systems Review of Systems  All other systems reviewed and are negative.    Physical Exam Updated Vital Signs BP (!) 141/92 (BP Location: Left Arm)   Pulse 95   Temp 97.6 F (36.4 C) (Oral)   Resp 16   Ht 5\' 4"  (1.626 m)   Wt 108.9 kg   SpO2 100%   BMI 41.20 kg/m   Physical Exam Vitals signs and nursing note reviewed.    38 year old female, resting comfortably and in no acute distress. Vital signs are significant for borderline elevated blood pressure. Oxygen saturation is 100%, which is normal. Head is normocephalic and atraumatic. PERRLA, EOMI. Oropharynx is clear. Neck is nontender and supple without adenopathy or JVD. Back is nontender and there is no CVA tenderness. Lungs are clear without rales, wheezes, or rhonchi. Chest is nontender. Heart has regular rate and rhythm without murmur. Abdomen is soft, flat, nontender without masses or hepatosplenomegaly and peristalsis is normoactive. Extremities have no cyanosis or edema, full range of motion is present. Skin is warm and dry without rash. Neurologic: Awake and alert, depressed affect, speech mildly dysarthric consistent with alcohol intoxication, cranial nerves are intact, there are no motor or sensory deficits.  ED Treatments / Results  Labs (all labs ordered are listed, but only abnormal results are displayed) Labs Reviewed  CBC WITH DIFFERENTIAL/PLATELET - Abnormal; Notable for the following components:      Result Value   WBC 15.0 (*)    HCT 46.1 (*)    Neutro Abs 13.2 (*)    All other components within normal limits  COMPREHENSIVE METABOLIC PANEL - Abnormal; Notable for the following components:   CO2 19 (*)    Total Protein 8.3 (*)    All other components within normal limits  ETHANOL - Abnormal; Notable for the following components:    Alcohol, Ethyl (B) 149 (*)    All other components within normal limits  RAPID URINE DRUG SCREEN, HOSP PERFORMED - Abnormal; Notable for the following components:   Cocaine POSITIVE (*)  All other components within normal limits  ACETAMINOPHEN LEVEL - Abnormal; Notable for the following components:   Acetaminophen (Tylenol), Serum <10 (*)    All other components within normal limits  SALICYLATE LEVEL  POC URINE PREG, ED   Procedures Procedures   Medications Ordered in ED Medications  alum & mag hydroxide-simeth (MAALOX/MYLANTA) 200-200-20 MG/5ML suspension 30 mL (has no administration in time range)  ondansetron (ZOFRAN) tablet 4 mg (has no administration in time range)  ibuprofen (ADVIL,MOTRIN) tablet 600 mg (has no administration in time range)     Initial Impression / Assessment and Plan / ED Course  I have reviewed the triage vital signs and the nursing notes.  Pertinent lab results that were available during my care of the patient were reviewed by me and considered in my medical decision making (see chart for details).  Major depression with suicidal ideation.  Alcohol intoxication.  Old records are reviewed, and she has no relevant past visits.  Screening labs are obtained and will obtain consultation with TTS.  Screening labs show elevated ethanol level consistent with ethanol intoxication, and positive urine drug screen with cocaine detected.  TTS consultation is appreciated, patient will be observed in the ED overnight and have psychiatric reevaluation done in the morning.  Final Clinical Impressions(s) / ED Diagnoses   Final diagnoses:  Severe episode of recurrent major depressive disorder, with psychotic features (HCC)  Alcohol intoxication, uncomplicated (HCC)  Cocaine abuse [facility](HCC)    ED Discharge Orders    None       Dione BoozeGlick, Lizvet Chunn, MD 07/30/18 0234

## 2018-07-29 NOTE — ED Triage Notes (Signed)
Pt admits to drinking a large amount of alcohol tonight and using crack cocaine.  Pt was unresponsive at home per family who called ems.  Pt stated to ems that she "wanted to end it all"

## 2018-07-30 ENCOUNTER — Inpatient Hospital Stay (HOSPITAL_COMMUNITY)
Admission: AD | Admit: 2018-07-30 | Discharge: 2018-08-05 | DRG: 885 | Disposition: A | Discharge status: Home / Self Care | Payer: Medicaid Other | Source: Intra-hospital | Attending: Psychiatry | Admitting: Psychiatry

## 2018-07-30 ENCOUNTER — Encounter (HOSPITAL_COMMUNITY): Payer: Self-pay | Admitting: Emergency Medicine

## 2018-07-30 ENCOUNTER — Other Ambulatory Visit: Payer: Self-pay

## 2018-07-30 ENCOUNTER — Emergency Department (HOSPITAL_COMMUNITY)
Admission: EM | Admit: 2018-07-30 | Discharge: 2018-07-30 | Disposition: A | Discharge status: Other Acute Inpatient Hospital | Payer: Medicaid Other | Source: Home / Self Care | Attending: Emergency Medicine | Admitting: Emergency Medicine

## 2018-07-30 DIAGNOSIS — J45909 Unspecified asthma, uncomplicated: Secondary | ICD-10-CM | POA: Insufficient documentation

## 2018-07-30 DIAGNOSIS — Z9049 Acquired absence of other specified parts of digestive tract: Secondary | ICD-10-CM | POA: Diagnosis not present

## 2018-07-30 DIAGNOSIS — Z809 Family history of malignant neoplasm, unspecified: Secondary | ICD-10-CM | POA: Diagnosis not present

## 2018-07-30 DIAGNOSIS — M170 Bilateral primary osteoarthritis of knee: Secondary | ICD-10-CM | POA: Diagnosis present

## 2018-07-30 DIAGNOSIS — Z833 Family history of diabetes mellitus: Secondary | ICD-10-CM

## 2018-07-30 DIAGNOSIS — Z91030 Bee allergy status: Secondary | ICD-10-CM

## 2018-07-30 DIAGNOSIS — Y906 Blood alcohol level of 120-199 mg/100 ml: Secondary | ICD-10-CM | POA: Diagnosis present

## 2018-07-30 DIAGNOSIS — Z975 Presence of (intrauterine) contraceptive device: Secondary | ICD-10-CM | POA: Diagnosis not present

## 2018-07-30 DIAGNOSIS — Z818 Family history of other mental and behavioral disorders: Secondary | ICD-10-CM | POA: Diagnosis not present

## 2018-07-30 DIAGNOSIS — R51 Headache: Secondary | ICD-10-CM | POA: Diagnosis not present

## 2018-07-30 DIAGNOSIS — Z8249 Family history of ischemic heart disease and other diseases of the circulatory system: Secondary | ICD-10-CM | POA: Diagnosis not present

## 2018-07-30 DIAGNOSIS — G4700 Insomnia, unspecified: Secondary | ICD-10-CM | POA: Diagnosis present

## 2018-07-30 DIAGNOSIS — F329 Major depressive disorder, single episode, unspecified: Secondary | ICD-10-CM

## 2018-07-30 DIAGNOSIS — R45851 Suicidal ideations: Secondary | ICD-10-CM

## 2018-07-30 DIAGNOSIS — F1410 Cocaine abuse, uncomplicated: Secondary | ICD-10-CM | POA: Diagnosis present

## 2018-07-30 DIAGNOSIS — F4310 Post-traumatic stress disorder, unspecified: Secondary | ICD-10-CM | POA: Diagnosis present

## 2018-07-30 DIAGNOSIS — F332 Major depressive disorder, recurrent severe without psychotic features: Principal | ICD-10-CM | POA: Diagnosis present

## 2018-07-30 DIAGNOSIS — F603 Borderline personality disorder: Secondary | ICD-10-CM | POA: Diagnosis present

## 2018-07-30 DIAGNOSIS — Z91410 Personal history of adult physical and sexual abuse: Secondary | ICD-10-CM | POA: Diagnosis not present

## 2018-07-30 DIAGNOSIS — G4730 Sleep apnea, unspecified: Secondary | ICD-10-CM | POA: Diagnosis present

## 2018-07-30 DIAGNOSIS — Z79899 Other long term (current) drug therapy: Secondary | ICD-10-CM | POA: Insufficient documentation

## 2018-07-30 DIAGNOSIS — Z823 Family history of stroke: Secondary | ICD-10-CM

## 2018-07-30 DIAGNOSIS — F419 Anxiety disorder, unspecified: Secondary | ICD-10-CM | POA: Diagnosis not present

## 2018-07-30 DIAGNOSIS — F333 Major depressive disorder, recurrent, severe with psychotic symptoms: Secondary | ICD-10-CM | POA: Diagnosis not present

## 2018-07-30 DIAGNOSIS — Z8349 Family history of other endocrine, nutritional and metabolic diseases: Secondary | ICD-10-CM

## 2018-07-30 DIAGNOSIS — F32A Depression, unspecified: Secondary | ICD-10-CM

## 2018-07-30 DIAGNOSIS — G8929 Other chronic pain: Secondary | ICD-10-CM | POA: Diagnosis present

## 2018-07-30 DIAGNOSIS — F1910 Other psychoactive substance abuse, uncomplicated: Secondary | ICD-10-CM | POA: Insufficient documentation

## 2018-07-30 DIAGNOSIS — Z841 Family history of disorders of kidney and ureter: Secondary | ICD-10-CM | POA: Diagnosis not present

## 2018-07-30 LAB — SALICYLATE LEVEL

## 2018-07-30 LAB — RAPID URINE DRUG SCREEN, HOSP PERFORMED
AMPHETAMINES: NOT DETECTED
Barbiturates: NOT DETECTED
Benzodiazepines: NOT DETECTED
Cocaine: POSITIVE — AB
Opiates: NOT DETECTED
Tetrahydrocannabinol: NOT DETECTED

## 2018-07-30 LAB — ETHANOL: ALCOHOL ETHYL (B): 149 mg/dL — AB (ref <10)

## 2018-07-30 LAB — ACETAMINOPHEN LEVEL: Acetaminophen (Tylenol), Serum: <10 ug/mL — ABNORMAL LOW (ref 10–30)

## 2018-07-30 MED ORDER — METHOCARBAMOL 500 MG PO TABS
500.0000 mg | ORAL_TABLET | Freq: Three times a day (TID) | ORAL | Status: AC | PRN
  Administered 2018-07-31 – 2018-08-04 (×7): 500 mg via ORAL
  Filled 2018-07-30 (×8): qty 1

## 2018-07-30 MED ORDER — ALUM & MAG HYDROXIDE-SIMETH 200-200-20 MG/5ML PO SUSP: 30.0000 mL | Freq: Four times a day (QID) | ORAL | Status: DC | PRN

## 2018-07-30 MED ORDER — HYDROXYZINE HCL 25 MG PO TABS
25.0000 mg | ORAL_TABLET | Freq: Four times a day (QID) | ORAL | Status: AC | PRN
  Administered 2018-07-30 – 2018-08-04 (×8): 25 mg via ORAL
  Filled 2018-07-30 (×8): qty 1

## 2018-07-30 MED ORDER — DICYCLOMINE HCL 20 MG PO TABS: 20.0000 mg | ORAL_TABLET | Freq: Four times a day (QID) | ORAL | Status: AC | PRN

## 2018-07-30 MED ORDER — CLONIDINE HCL 0.1 MG PO TABS
0.1000 mg | ORAL_TABLET | Freq: Every day | ORAL | Status: DC
  Administered 2018-08-05: 0.1 mg via ORAL
  Filled 2018-07-30 (×2): qty 1

## 2018-07-30 MED ORDER — TRAZODONE HCL 50 MG PO TABS
50.0000 mg | ORAL_TABLET | Freq: Every evening | ORAL | Status: DC | PRN
  Administered 2018-07-30 – 2018-08-04 (×6): 50 mg via ORAL
  Filled 2018-07-30 (×5): qty 1

## 2018-07-30 MED ORDER — CLONIDINE HCL 0.1 MG PO TABS
0.1000 mg | ORAL_TABLET | ORAL | Status: AC
  Administered 2018-08-02 – 2018-08-04 (×4): 0.1 mg via ORAL
  Filled 2018-07-30 (×4): qty 1

## 2018-07-30 MED ORDER — ACETAMINOPHEN 325 MG PO TABS
650.0000 mg | ORAL_TABLET | Freq: Four times a day (QID) | ORAL | Status: DC | PRN
  Administered 2018-07-31 – 2018-08-05 (×8): 650 mg via ORAL
  Filled 2018-07-30 (×8): qty 2

## 2018-07-30 MED ORDER — DOCOSANOL 10 % EX CREA: 1.0000 "application " | TOPICAL_CREAM | Freq: Two times a day (BID) | CUTANEOUS | Status: DC | PRN

## 2018-07-30 MED ORDER — IBUPROFEN 400 MG PO TABS: 600.0000 mg | ORAL_TABLET | Freq: Three times a day (TID) | ORAL | Status: DC | PRN

## 2018-07-30 MED ORDER — ACYCLOVIR 5 % EX CREA
TOPICAL_CREAM | Freq: Four times a day (QID) | CUTANEOUS | Status: DC
  Filled 2018-07-30: qty 5

## 2018-07-30 MED ORDER — LOPERAMIDE HCL 2 MG PO CAPS: 2.0000 mg | ORAL_CAPSULE | ORAL | Status: AC | PRN

## 2018-07-30 MED ORDER — MAGNESIUM HYDROXIDE 400 MG/5ML PO SUSP
30.0000 mL | Freq: Every day | ORAL | Status: DC | PRN
  Administered 2018-07-31: 30 mL via ORAL
  Filled 2018-07-30: qty 30

## 2018-07-30 MED ORDER — NON FORMULARY: 1.0000 [drp] | Freq: Two times a day (BID) | Status: DC | PRN

## 2018-07-30 MED ORDER — FLUTICASONE PROPIONATE 50 MCG/ACT NA SUSP: 2.0000 | Freq: Two times a day (BID) | NASAL | Status: DC

## 2018-07-30 MED ORDER — CLONIDINE HCL 0.1 MG PO TABS
0.1000 mg | ORAL_TABLET | Freq: Four times a day (QID) | ORAL | Status: AC
  Administered 2018-07-30 – 2018-08-02 (×10): 0.1 mg via ORAL
  Filled 2018-07-30 (×13): qty 1

## 2018-07-30 MED ORDER — NAPROXEN 500 MG PO TABS
500.0000 mg | ORAL_TABLET | Freq: Two times a day (BID) | ORAL | Status: AC | PRN
  Administered 2018-07-30 – 2018-08-04 (×8): 500 mg via ORAL
  Filled 2018-07-30 (×10): qty 1

## 2018-07-30 MED ORDER — ONDANSETRON HCL 4 MG PO TABS: 4.0000 mg | ORAL_TABLET | Freq: Three times a day (TID) | ORAL | Status: DC | PRN

## 2018-07-30 MED ORDER — ONDANSETRON 4 MG PO TBDP
4.0000 mg | ORAL_TABLET | Freq: Four times a day (QID) | ORAL | Status: AC | PRN
  Administered 2018-07-31: 4 mg via ORAL
  Filled 2018-07-30: qty 1

## 2018-07-30 MED ORDER — ALUM & MAG HYDROXIDE-SIMETH 200-200-20 MG/5ML PO SUSP: 30.0000 mL | ORAL | Status: DC | PRN

## 2018-07-30 NOTE — ED Triage Notes (Addendum)
Pt reports depression, hopelessness, and wanting to her life to end. States that she does not have a plan and that she doesn't think she would do anything to harm herself, but states "I just don't want to do it anymore." Pt just d/c from [facility] earlier this afternoon. Pt states she went home and spoke with family and they talked her into seeking further treatment. Pt states she also has an addiction to cocaine, and would like to receive help for that, but family does know she is addicted to cocaine.
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
## 2018-07-30 NOTE — Tx Team (Signed)
Initial Treatment Plan 07/30/2018 10:08 PM Tina Ross PET:[id number]    PATIENT STRESSORS: Loss of family members Substance abuse   PATIENT STRENGTHS: Ability for insight Capable of independent living Communication skills Motivation for treatment/growth Supportive family/friends   PATIENT IDENTIFIED PROBLEMS: "Substance abuse"  "My ex-relationships"  My children being disrespectful"                 DISCHARGE CRITERIA:  Ability to meet basic life and health needs Improved stabilization in mood, thinking, and/or behavior Reduction of life-threatening or endangering symptoms to within safe limits Verbal commitment to aftercare and medication compliance Withdrawal symptoms are absent or subacute and managed without 24-hour nursing intervention  PRELIMINARY DISCHARGE PLAN: Outpatient therapy Return to previous living arrangement Return to previous work or school arrangements  PATIENT/FAMILY INVOLVEMENT: This treatment plan has been presented to and reviewed with the patient, Tina Ross, and/or family member.  The patient and family have been given the opportunity to ask questions and make suggestions.  Tania Ade, RN 07/30/2018, 10:08 PM

## 2018-07-30 NOTE — ED Notes (Signed)
Pt here and discharged earlier today Went home and discussed with family and desires further care  She reports an addiction to cocaine but denied it earlier due to family presence and they do not know

## 2018-07-30 NOTE — ED Notes (Signed)
Report to [place]. RN, [facility]
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
## 2018-07-30 NOTE — Discharge Instructions (Addendum)
Substance Abuse Treatment Programs ° °Intensive Outpatient Programs °[facility]     °[address]      °[place], [place]                   °[phone]      ° °[facility] °[address] °[place], [place] °[phone] ° °[facility]     °(Inpatient and outpatient)     °[address]           °[phone]   ° °[facility] °[phone] (Suboxone and Methadone) ° °[address]      °[place], [place] [address]      °[phone]      ° °[address] °[place], [place] °852-3033 ° °[facility] (Outpatient/Inpatient, Chemical)    °(insurance only) [phone]      °       °[facility] (Groups & Residential) °[place], [place] °[phone] ° °   °[facility]     °[address]     °[place], [place]      °[phone]      ° °[facility] (for caregivers and family) °[address] °[place], [place] °336-299-4655 ° ° ° ° ° °Residential Treatment Programs °[facility]      °[address], [place], [place] [address]  °[phone]      ° °[facility] °[address] [place], [place] [address] °[phone] ° °[facility]        °[phone]      ° °[facility] °[phone] ° °[facility]             °[address]                                         °[place], [place]                                                °[phone] or [phone]                              ° °[facility] [place] °[address] °[place], [address] °[phone] ° °[facility]    °[address]      °, [place]     °[phone]      ° °[facility] °[address] °, [place] °[phone] ° °[facility]   °[address]     °[place], [place] [address]     °[phone]      °Admissions: 8am-3pm M-F ° °[facility] °[address] °[place],  [place] °[phone] ° °[facility]: Residential Program (90 Days)   °[place], [place]      °[phone] or [phone]    ° °[facility] °[place], [place] °(Walk in Hours over the weekend or by referral) ° °[facility] °[address], [place], [place] [address] °([phone] ° °Crisis Mobile: [facility]:  1-877-626-1772 (for crisis response 24 hours a day) °[facility] Hotline:      1-800-256-2452 °Outpatient Psychiatry and Counseling ° °[facility]: Mobile Crisis   Management 24 hours:  1-579-867-5796  [facility] of the [employer] sliding scale fee and walk in schedule: M-F 8am-12pm/1pm-3pm [address]  [place], [place] [address] [place], [place] [address] [phone]  [facility] ([facility])- new patient walk-in appointments available Monday - Friday 8am -3pm.          [address] [place], [place] [address] [phone] or crisis line- [facility]/ Intensive Outpatient Therapy Program [place], [place] [address] [place]      [phone] [place], [place] [address]                 [place]   [facility] [phone] [place], [place] [address]   [employer]          [address] Johnette Abraham  [place], [place] [address]       [phone]  [facility] [address] [place], [place] [address] [phone]  [facility]    [address] [place], [place] [address]     [place], [place]     [place] [address]     [phone]       [facility] [place] [address]  [facility]     [address], [place], [place], [place] [address] [place]     [address]     [place], [place] [address]     [phone]       [facility] [address] [place], [place] [address] [phone] Resources for Temporary Residential Assistance/Crisis [facility]) M-F 8am-3pm   [address] [place], [place] [address]   [phone] Services include: laundry, barbering, support groups, case management, phone  & computer access, showers, AA/NA mtgs, mental health/substance abuse nurse, job skills class, disability information, VA assistance, spiritual classes, etc.   [facility]   [address], [place]     [place]              [employer] (women and children)       [place]. [place], [place] [address] [phone] [email or web address] for application and process Application Required  [facility]   [address]    [place] [address]     [phone]                    [place],  [address] [phone](OILNZVJK application appt.) Application Required  [facility] (women only)    [address]     [place],  [address]     [phone]  Intake starts 6pm daily Need valid ID, SSC, & Police report [employer] [address] [place], [place] [phone] Application Required  [facility] (men only)     [address]      [place], [place]     [phone]       [facility] [place] (Pregnant women only) [address] [place], [place] [phone]  [facility]      [address]      [place], [place] [address]     [phone]             [facility] [address] [place], [place] [phone] 90 day commitment/SA/Application process  [facility](men only)     [address]     [place], [place]     [phone]       Check-in at [facility] [address] [place], [place] [address] [phone] Men/Women/Women and Children must be there by 7 pm  [facility] [place], [place] [phone]                 Take your usual prescriptions as previously directed.  Call your regular medical doctor on Monday to schedule a follow up appointment within the next week. Call the mental health resources given to you today to schedule a follow up appointment within the next week.  Return to the Emergency Department immediately sooner if worsening.
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
## 2018-07-30 NOTE — ED Notes (Signed)
[facility]  While on phone call to state pt cannot come until 1930 from TTS

## 2018-07-30 NOTE — ED Provider Notes (Signed)
Pt re-evaluated by Psych team: Pt denies SI/HI, no AVH, there is no criteria to admit pt at this time, pt can be d/c with outpt resources. Will d/c stable.    Samuel Jester, DO 07/30/18 1111

## 2018-07-30 NOTE — ED Notes (Signed)
Spoke with Lajoyce Corners from TTS who states pt may come after 1930.  She did not put this in her initial note.
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
## 2018-07-30 NOTE — BHH Counselor (Addendum)
Per [facility] AC:  Patient accepted to [facility], bed 2, accepting [name], NP and attending is Dr. Jola Babinski.  [facility] acceptance information provided to [facility].
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
## 2018-07-30 NOTE — ED Notes (Signed)
Pt given meal tray.

## 2018-07-30 NOTE — Progress Notes (Signed)
CSW faxed over outpatient resources ([facility] for patient.   Vilma Meckel. Algis Greenhouse, MSW, LCSW Clinical Social Work/Disposition Phone: [phone] Fax: [phone]

## 2018-07-31 DIAGNOSIS — F332 Major depressive disorder, recurrent severe without psychotic features: Principal | ICD-10-CM

## 2018-07-31 DIAGNOSIS — G4700 Insomnia, unspecified: Secondary | ICD-10-CM

## 2018-07-31 DIAGNOSIS — F419 Anxiety disorder, unspecified: Secondary | ICD-10-CM

## 2018-07-31 LAB — LIPID PANEL
Cholesterol: 125 mg/dL (ref 0–200)
HDL: 32 mg/dL — ABNORMAL LOW (ref >40)
LDL Cholesterol: 65 mg/dL (ref 0–99)
Total CHOL/HDL Ratio: 3.9 RATIO
Triglycerides: 142 mg/dL (ref <150)
VLDL: 28 mg/dL (ref 0–40)

## 2018-07-31 LAB — TSH: TSH: 4.275 u[IU]/mL (ref 0.350–4.500)

## 2018-07-31 LAB — HEMOGLOBIN A1C
Hgb A1c MFr Bld: 5.3 % (ref 4.8–5.6)
Mean Plasma Glucose: 105.41 mg/dL

## 2018-07-31 MED ORDER — CHLORDIAZEPOXIDE HCL 25 MG PO CAPS
25.0000 mg | ORAL_CAPSULE | Freq: Three times a day (TID) | ORAL | Status: DC | PRN
  Administered 2018-08-03: 25 mg via ORAL
  Filled 2018-07-31: qty 1

## 2018-07-31 MED ORDER — FLUOXETINE HCL 10 MG PO CAPS
10.0000 mg | ORAL_CAPSULE | Freq: Every day | ORAL | Status: DC
  Administered 2018-07-31 – 2018-08-02 (×3): 10 mg via ORAL
  Filled 2018-07-31 (×6): qty 1
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
## 2018-07-31 NOTE — Plan of Care (Signed)
  Problem: Activity: Goal: Interest or engagement in activities will improve Outcome: Not Progressing   Problem: Coping: Goal: Ability to verbalize frustrations and anger appropriately will improve Outcome: Progressing   D: Pt alert and oriented on the unit. Pt denies SI/HI, A/VH. Pt was isolative to her room for most of the day and only awoke to go to the cafeteria during meals. Pt did not attend group or participate in unit activities. Pt is pleasant and cooperative. A: Education, support and encouragement provided, q15 minute safety checks remain in effect. Medications administered per MD orders. R: No reactions/side effects to medicine noted. Pt denies any concerns at this time, and verbally contracts for safety. Pt ambulating on the unit with no issues. Pt remains safe on and off the unit.
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
## 2018-07-31 NOTE — [facility] Group Notes (Signed)
[facility] Group Notes: (Clinical Social Work)   07/31/2018      Type of Therapy:  Group Therapy   Participation Level:  Did Not Attend despite MHT prompting   Sherron Mapp Grossman-Orr, LCSW 07/31/2018, 1:29 PM     

## 2018-08-01 MED ORDER — BISACODYL 10 MG RE SUPP
10.0000 mg | Freq: Once | RECTAL | Status: AC
  Administered 2018-08-01: 10 mg via RECTAL
  Filled 2018-08-01 (×2): qty 1

## 2018-08-01 MED ORDER — POLYETHYLENE GLYCOL 3350 17 G PO PACK
17.0000 g | PACK | Freq: Every day | ORAL | Status: DC
  Administered 2018-08-01 – 2018-08-02 (×2): 17 g via ORAL
  Filled 2018-08-01 (×9): qty 1

## 2018-08-01 NOTE — Progress Notes (Signed)
D: Patient observed in room much of evening however did attend AA. Dayroom loud at times and patient states she prefers a quieter space. Verbalizes distress over family visit earlier this evening. Patient states her brother, his wife, her dad and her 38 yo daughter came however her daughter elected to stay in lobby. Also reports her brother stated that her illness, substance abuse, and emotional distress are "just in her head and she should get over it." Patient's affect sad, tearful with congruent mood. Continues to report headache rated at an 8/10 and also complains of constipation.   A: Medicated per orders, prn naproxen, robaxin, vistaril, trazadone and MOM given for complaints. Patient complained shortly after MOM admin that she was on the toilet straining and in pain. Allyson Sabal, NP phoned and order received for dulcolax suppository which was self admin. Medication education provided. Level III obs in place for safety. Emotional support offered. Encouraged patient empathize with daughter who may be fearful to visit. Also encouraged patient to consider setting limits with brother to advocate for herself in her recovery. Reminded patient to complete Suicide Safety Plan before discharge. Encouraged to attend and participate in unit programming.  Fall prevention plan in place and reviewed with patient as pt is a high fall risk.   R: Patient verbalizes understanding of POC, falls prevention education. On reassess, patient was asleep. Patient denies SI/HI/AVH and remains safe on level III obs. Will continue to monitor throughout the night.

## 2018-08-01 NOTE — Progress Notes (Signed)
Recreation Therapy Notes  Date: 1.20.20   Time: 0930 Location: [address] Dayroom  Group Topic: Stress Management  Goal Area(s) Addresses:  Patient will identify stress management techniques. Patient will identify benefit of using stress management post d/c.   Intervention: Stress Management  Activity : Guided Imagery.  LRT introduced the stress management technique of guided imagery.  LRT read a script that let patients envision laying outside in a meadow at summer time.  Patients were to listen and follow along as script was read to engage in activity.  Education:  Stress Management, Discharge Planning.   Education Outcome: Acknowledges Education  Clinical Observations/Feedback: Pt did not attend group.    Collene Massimino, LRT/CTRS         Tobi Groesbeck A 08/01/2018 12:06 PM 

## 2018-08-01 NOTE — Tx Team (Signed)
Interdisciplinary Treatment and Diagnostic Plan Update  08/01/2018 Time of Session: 0830AM Tina Ross MRN: [id number]  Principal Diagnosis: Severe recurrent major depression without psychotic features [facility])  Secondary Diagnoses: Principal Problem:   Severe recurrent major depression without psychotic features (HCC)   Current Medications:  Current Facility-Administered Medications  Medication Dose Route Frequency Provider Last Rate Last Dose  . acetaminophen (TYLENOL) tablet 650 mg  650 mg Oral Q6H PRN Nira Conn A, NP   650 mg at 07/31/18 1551  . alum & mag hydroxide-simeth (MAALOX/MYLANTA) 200-200-20 MG/5ML suspension 30 mL  30 mL Oral Q4H PRN Nira Conn A, NP      . chlordiazePOXIDE (LIBRIUM) capsule 25 mg  25 mg Oral TID PRN Antonieta Pert, MD      . cloNIDine (CATAPRES) tablet 0.1 mg  0.1 mg Oral QID Nira Conn A, NP   0.1 mg at 08/01/18 0909   Followed by  . [START ON 08/02/2018] cloNIDine (CATAPRES) tablet 0.1 mg  0.1 mg Oral BH-qamhs Jackelyn Poling, NP       Followed by  . [START ON 08/05/2018] cloNIDine (CATAPRES) tablet 0.1 mg  0.1 mg Oral QAC breakfast Nira Conn A, NP      . dicyclomine (BENTYL) tablet 20 mg  20 mg Oral Q6H PRN Jackelyn Poling, NP      . Docosanol 10 % CREA 1 application  1 application Apply externally BID PRN Antonieta Pert, MD      . FLUoxetine (PROZAC) capsule 10 mg  10 mg Oral Daily Antonieta Pert, MD   10 mg at 08/01/18 7062  . hydrOXYzine (ATARAX/VISTARIL) tablet 25 mg  25 mg Oral Q6H PRN Jackelyn Poling, NP   25 mg at 07/31/18 2135  . loperamide (IMODIUM) capsule 2-4 mg  2-4 mg Oral PRN Nira Conn A, NP      . magnesium hydroxide (MILK OF MAGNESIA) suspension 30 mL  30 mL Oral Daily PRN Nira Conn A, NP   30 mL at 07/31/18 2154  . methocarbamol (ROBAXIN) tablet 500 mg  500 mg Oral Q8H PRN Nira Conn A, NP   500 mg at 08/01/18 0910  . naproxen (NAPROSYN) tablet 500 mg  500 mg Oral BID PRN Nira Conn A, NP   500 mg at 08/01/18  0908  . ondansetron (ZOFRAN-ODT) disintegrating tablet 4 mg  4 mg Oral Q6H PRN Nira Conn A, NP   4 mg at 07/31/18 1551  . polyethylene glycol (MIRALAX / GLYCOLAX) packet 17 g  17 g Oral Daily Nira Conn A, NP      . traZODone (DESYREL) tablet 50 mg  50 mg Oral QHS PRN Jackelyn Poling, NP   50 mg at 07/31/18 2136   PTA Medications: Medications Prior to Admission  Medication Sig Dispense Refill Last Dose  . ferrous sulfate 325 (65 FE) MG tablet Take 650 mg by mouth 2 (two) times daily.   Past Month at Unknown time  . ibuprofen (ADVIL,MOTRIN) 600 MG tablet Take 1 tablet (600 mg total) by mouth every 6 (six) hours as needed. (Patient taking differently: Take 600 mg by mouth every 6 (six) hours as needed (migraine). ) 30 tablet 0 Past Week at Unknown time  . levonorgestrel (MIRENA) 20 MCG/24HR IUD 1 each by Intrauterine route once.   current    Patient Stressors: Loss of family members Substance abuse  Patient Strengths: Ability for insight Capable of independent living Communication skills Motivation for treatment/growth Supportive family/friends  Treatment  Modalities: Medication Management, Group therapy, Case management,  1 to 1 session with clinician, Psychoeducation, Recreational therapy.   Physician Treatment Plan for Primary Diagnosis: Severe recurrent major depression without psychotic features (HCC) Long Term Goal(s): Improvement in symptoms so as ready for discharge Improvement in symptoms so as ready for discharge   Short Term Goals: Ability to identify changes in lifestyle to reduce recurrence of condition will improve Ability to disclose and discuss suicidal ideas Ability to demonstrate self-control will improve Ability to identify and develop effective coping behaviors will improve Ability to identify triggers associated with substance abuse/mental health issues will improve Ability to disclose and discuss suicidal ideas Ability to demonstrate self-control will  improve Ability to identify and develop effective coping behaviors will improve Ability to identify triggers associated with substance abuse/mental health issues will improve  Medication Management: Evaluate patient's response, side effects, and tolerance of medication regimen.  Therapeutic Interventions: 1 to 1 sessions, Unit Group sessions and Medication administration.  Evaluation of Outcomes: Progressing  Physician Treatment Plan for Secondary Diagnosis: Principal Problem:   Severe recurrent major depression without psychotic features (HCC)  Long Term Goal(s): Improvement in symptoms so as ready for discharge Improvement in symptoms so as ready for discharge   Short Term Goals: Ability to identify changes in lifestyle to reduce recurrence of condition will improve Ability to disclose and discuss suicidal ideas Ability to demonstrate self-control will improve Ability to identify and develop effective coping behaviors will improve Ability to identify triggers associated with substance abuse/mental health issues will improve Ability to disclose and discuss suicidal ideas Ability to demonstrate self-control will improve Ability to identify and develop effective coping behaviors will improve Ability to identify triggers associated with substance abuse/mental health issues will improve     Medication Management: Evaluate patient's response, side effects, and tolerance of medication regimen.  Therapeutic Interventions: 1 to 1 sessions, Unit Group sessions and Medication administration.  Evaluation of Outcomes: Progressing   RN Treatment Plan for Primary Diagnosis: Severe recurrent major depression without psychotic features (HCC) Long Term Goal(s): Knowledge of disease and therapeutic regimen to maintain health will improve  Short Term Goals: Ability to participate in decision making will improve, Ability to verbalize feelings will improve and Ability to identify and develop  effective coping behaviors will improve  Medication Management: RN will administer medications as ordered by provider, will assess and evaluate patient's response and provide education to patient for prescribed medication. RN will report any adverse and/or side effects to prescribing provider.  Therapeutic Interventions: 1 on 1 counseling sessions, Psychoeducation, Medication administration, Evaluate responses to treatment, Monitor vital signs and CBGs as ordered, Perform/monitor CIWA, COWS, AIMS and Fall Risk screenings as ordered, Perform wound care treatments as ordered.  Evaluation of Outcomes: Progressing   LCSW Treatment Plan for Primary Diagnosis: Severe recurrent major depression without psychotic features (HCC) Long Term Goal(s): Safe transition to appropriate next level of care at discharge, Engage patient in therapeutic group addressing interpersonal concerns.  Short Term Goals: Engage patient in aftercare planning with referrals and resources, Increase ability to appropriately verbalize feelings, Increase emotional regulation, Identify triggers associated with mental health/substance abuse issues and Increase skills for wellness and recovery  Therapeutic Interventions: Assess for all discharge needs, 1 to 1 time with Social worker, Explore available resources and support systems, Assess for adequacy in community support network, Educate family and significant other(s) on suicide prevention, Complete Psychosocial Assessment, Interpersonal group therapy.  Evaluation of Outcomes: Progressing   Progress in Treatment: Attending groups:  Yes. Participating in groups: Yes. Taking medication as prescribed: Yes. Toleration medication: Yes. Family/Significant other contact made: No, will contact:  Hassan Bucklererry Witty, pts aunt with consent Patient understands diagnosis: Yes. Discussing patient identified problems/goals with staff: Yes. Medical problems stabilized or resolved: Yes. Denies  suicidal/homicidal ideation: Yes. Issues/concerns per patient self-inventory: No Other: n/a  New problem(s) identified: No, Describe:  n/a  New Short Term/Long Term Goal(s): detox, medication management for mood stabilization; elimination of SI thoughts; development of comprehensive mental wellness/sobriety plan.    Patient Goals:  "To detox and get into a residential treatment program"  Discharge Plan or Barriers: CSW assessing for appropriate referrals. [facility] pamphlet, Mobile Crisis information, and AA/NA information provided to patient for additional community support and resources. CSW exploring residential options, possibly [facility]   Reason for Continuation of Hospitalization: Anxiety Depression Medication stabilization Withdrawal symptoms  Estimated Length of Stay: Thursday 08/04/2018  Attendees: Patient:Tina Ross 08/01/2018 9:22 AM  Physician: Dr. Jola Babinskilary MD 08/01/2018 9:22 AM  Nursing: Huntley DecSara RN; Marchelle Folksmanda RN 08/01/2018 9:22 AM  RN Care Manager:x 08/01/2018 9:22 AM  Social Worker: Corrie MckusickHeather Zaydah Nawabi LCSW 08/01/2018 9:22 AM  Recreational Therapist: x 08/01/2018 9:22 AM  Other: Marciano SequinJanet Sykes NP 08/01/2018 9:22 AM  Other:  08/01/2018 9:22 AM  Other: 08/01/2018 9:22 AM    Scribe for Treatment Team: Rona RavensHeather S Brycin Kille, LCSW 08/01/2018 9:22 AM

## 2018-08-01 NOTE — Progress Notes (Signed)
Patient ID: Tina Ross, female   DOB: [birth year], 38 y.o.   MRN: [id number]  Pt currently presents with a flat affect and labile behavior. Pt reports to writer that their goal is to "sleep." Presents with signs and symptoms of withdrawal including headache, fatigue and ongoing agitation. Pt appetite is WDL. Pt reports good sleep with current medication regimen.   Pt provided with medications per providers orders. Pt's labs and vitals were monitored throughout the night. Pt supported emotionally and encouraged to express concerns and questions. Pt educated on medications and assertiveness techniques.   Pt's safety ensured with 15 minute and environmental checks. Pt currently denies SI/HI and A/V hallucinations. Pt verbally agrees to seek staff if SI/HI or A/VH occurs and to consult with staff before acting on any harmful thoughts. Will continue POC.

## 2018-08-01 NOTE — Progress Notes (Signed)
The patient attended last evening's A.A.meeting and was appropriate.  
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
## 2018-08-01 NOTE — [facility] Group Notes (Signed)
LCSW Group Therapy Note   08/01/2018 1:15pm   Type of Therapy and Topic:  Group Therapy:  Overcoming Obstacles   Participation Level:  Did Not Attend--pt invited. Chose to remain in bed.    Description of Group:    In this group patients will be encouraged to explore what they see as obstacles to their own wellness and recovery. They will be guided to discuss their thoughts, feelings, and behaviors related to these obstacles. The group will process together ways to cope with barriers, with attention given to specific choices patients can make. Each patient will be challenged to identify changes they are motivated to make in order to overcome their obstacles. This group will be process-oriented, with patients participating in exploration of their own experiences as well as giving and receiving support and challenge from other group members.   Therapeutic Goals: 1. Patient will identify personal and current obstacles as they relate to admission. 2. Patient will identify barriers that currently interfere with their wellness or overcoming obstacles.  3. Patient will identify feelings, thought process and behaviors related to these barriers. 4. Patient will identify two changes they are willing to make to overcome these obstacles:      Summary of Patient Progress   x   Therapeutic Modalities:   Cognitive Behavioral Therapy Solution Focused Therapy Motivational Interviewing Relapse Prevention Therapy  Rona Ravens, LCSW 08/01/2018 2:40 PM

## 2018-08-02 MED ORDER — LIDOCAINE 5 % EX PTCH
1.0000 | MEDICATED_PATCH | Freq: Every day | CUTANEOUS | Status: DC | PRN
  Administered 2018-08-02 – 2018-08-04 (×3): 1 via TRANSDERMAL
  Filled 2018-08-02 (×3): qty 1

## 2018-08-02 MED ORDER — FLUOXETINE HCL 20 MG PO CAPS
20.0000 mg | ORAL_CAPSULE | Freq: Every day | ORAL | Status: DC
  Administered 2018-08-03 – 2018-08-05 (×3): 20 mg via ORAL
  Filled 2018-08-02 (×5): qty 1

## 2018-08-02 NOTE — Plan of Care (Signed)
  Problem: Education: Goal: Knowledge of  General Education information/materials will improve Outcome: Progressing   Problem: Education: Goal: Emotional status will improve Outcome: Not Progressing Goal: Mental status will improve Outcome: Not Progressing   Problem: Activity: Goal: Interest or engagement in activities will improve Outcome: Not Progressing   Problem: Education: Goal: Verbalization of understanding the information provided will improve Outcome: Adequate for Discharge   Problem: Activity: Goal: Sleeping patterns will improve Outcome: Adequate for Discharge

## 2018-08-02 NOTE — Progress Notes (Signed)
[facility] [employer] Group Therapy 08/02/2018 12:38 PM  Type of Therapy: Mental Health Association Presentation  Participation Level: Invited, did not attend   Summary of Progress/Problems: Mental Health Association (MHA) Speaker came to talk about his personal journey with mental health. The pt processed ways by which to relate to the speaker. MHA speaker provided handouts and educational information pertaining to groups and services offered by the [facility].   Enid Cutter, MSW, [facility] 08/02/2018 12:38 PM

## 2018-08-02 NOTE — Progress Notes (Signed)
Pt on unit interacting with other patients.  Pt is pleasant and friendly.  Pt denies SI, HI and AVH and contracts for safety.  Pt is demanding of resources and wants Gatorade several times during the shift stating that her "doc" said she can have what she wants.  Carb and sugar intake is significant at snack time with pt eating several ice creams, pretzels, food bars.   Pt denies SI, HI and AVH.  Pt verbally contracts for safety. Pt is medication compliant, attends group and participates. Medications given.  Pt in dayroom until it closes and goes to room to sleep. Pt remains safe on unit
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
## 2018-08-02 NOTE — Progress Notes (Signed)
D   Pt is pleasant on approach and cooperative    She complained of throat discomfort and requested throat losenge    She interacts well with others and is compliant with treatment and unit activities A    Verbal support given   Medications administered ane effectiveness monitored     Q 15 min checks R   Pt remains safe at this time

## 2018-08-02 NOTE — Progress Notes (Signed)
D: Patient is poor participant in her care.  She remains in bed refusing to come up for her medications/blood pressure.  She has completed her detox protocol.  SW attempted to get her a bed a [facility]; currently there are no beds.  She is unsure what her discharge plan is.  She states, "I don't know.  I haven't talked to anybody."  She rates her depression/hopelessness as a 9; anxiety as a 10.  She continues to report withdrawal symptoms such as tremors, cravings, agitation, chilling, cramping, and irritability.  She denies any thoughts of self harm today.  She reports several physical complaints such as a achy knees, dizziness, and headache.  She was given robaxin prn.  She is sleeping well; her appetite is good; energy level is low, and her concentration is poor.    A: Continue to monitor medication management and MD orders.  Safety checks completed every 15 minutes per protocol.  Offer support and encouragement as needed.  R: Patient is receptive to staff; her behavior is appropriate.

## 2018-08-02 NOTE — [facility] Suicide Risk Assessment (Signed)
[facility] INPATIENT:  Family/Significant Other Suicide Prevention Education  Suicide Prevention Education:  Education Completed; aunt, Hassan Buckler, [phone] has been identified by the patient as the family member/significant other with whom the patient will be residing, and identified as the person(s) who will aid the patient in the event of a mental health crisis (suicidal ideations/suicide attempt).  With written consent from the patient, the family member/significant other has been provided the following suicide prevention education, prior to the and/or following the discharge of the patient.  The suicide prevention education provided includes the following:  Suicide risk factors  Suicide prevention and interventions  National Suicide Hotline telephone number  [facility] assessment telephone number  [facility] Emergency Assistance 911  [facility] and/or Residential Mobile Crisis Unit telephone number  Request made of family/significant other to:  Remove weapons (e.g., guns, rifles, knives), all items previously/currently identified as safety concern.    Remove drugs/medications (over-the-counter, prescriptions, illicit drugs), all items previously/currently identified as a safety concern.  The family member/significant other verbalizes understanding of the suicide prevention education information provided.  The family member/significant other agrees to remove the items of safety concern listed above.  CSW spoke with patient's aunt. Aunt is concerned with patient "not having anywhere to go" at discharge. CSW explained referrals have been sent to a residential treatment facility, but they do not have bed availability at this time. CSW explained patient may be discharged with shelter resources and outpatient follow up while waiting for bed availability for residential treatment. Aunt agreeable.  Aunt shares that she hopes for patient to go into residential treatment,  explaining "she's been dealing with this for 15 years, a couple days won't resolve her issues." Aunt requesting follow up call tomorrow or when a discharge date and plan are established. No other safety concerns at this time.  Tina Ross 08/02/2018, 3:29 PM

## 2018-08-02 NOTE — Progress Notes (Signed)
Patient shared with the group that she is proud of the fact that she spent less time in her bedroom. She also shared that she had a good talk with her daughter over the telephone. Her goal for tomorrow is to continue to spend more time with her peers.

## 2018-08-02 NOTE — Progress Notes (Signed)
CSW contacted Shayla in admissions at [facility]. No beds are available at this time.  Iris Pert, MSW, LCSW Clinical Social Work 08/02/2018 9:44 AM

## 2018-08-03 LAB — INFLUENZA PANEL BY PCR (TYPE A & B)
Influenza A By PCR: NEGATIVE
Influenza B By PCR: NEGATIVE

## 2018-08-03 NOTE — Progress Notes (Signed)
Recreation Therapy Notes  Date:  1.22.20 Time: 0930 Location: [address] Hall Dayroom  Group Topic: Stress Management  Goal Area(s) Addresses:  Patient will identify positive stress management techniques. Patient will identify benefits of using stress management post d/c.  Intervention:  Stress Management  Activity :  Meditation.  LRT introduced the stress management technique of meditation.  LRT played a meditation that dealt positive possibilities which walked patients through looking at each day as a new beginning.  Patients were to follow along as the meditation played to engage in the activity.  Education:  Stress Management, Discharge Planning.   Education Outcome: Acknowledges Education  Clinical Observations/Feedback: Pt did not attend group.    Caroll Rancher, LRT/CTRS         Caroll Rancher A 08/03/2018 11:39 AM
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
## 2018-08-03 NOTE — Progress Notes (Signed)
Patient ID: Tina Ross, female   DOB: March 06, 1981, 38 y.o.   MRN: [id number]  Pt currently presents with a flat affect and labile behavior. Pt is easily irritated. Pt reports to writer that she is having nasal congestion and cold sweats. Pt states "I think I have the flu." Pt reports poor sleep due to roommate snoring. Pt [facility] is able to go to a treatment facility.   Pt provided with medications per providers orders. Pt's labs and vitals were monitored throughout the night. Pt supported emotionally and encouraged to express concerns and questions. Pt educated on medications and suicide prevention precautions. Pt also educated on droplet precautions.   Pt's safety ensured with 15 minute and environmental checks. Pt currently denies SI/HI and A/V hallucinations. Pt verbally agrees to seek staff if SI/HI or A/VH occurs and to consult with staff before acting on any harmful thoughts. PT tested for the flu, results were negative. Will continue POC.

## 2018-08-04 NOTE — Plan of Care (Addendum)
Patient was pleasant upon approach this morning. Denies SI HI AVH. Patient was informed by writer and [facility] that CPS will be contacting her and visiting her on the unit to ask her some questions. Patient was initially very angry, but has since calmed down.  No complaints about medications. Safety is in place with 15 minute checks.   Problem: Education: Goal: Knowledge of [facility] Education information/materials will improve Outcome: Progressing Goal: Emotional status will improve Outcome: Progressing Goal: Mental status will improve Outcome: Progressing Goal: Verbalization of understanding the information provided will improve Outcome: Progressing   Problem: Activity: Goal: Interest or engagement in activities will improve Outcome: Progressing

## 2018-08-04 NOTE — Progress Notes (Signed)
CSW met with pt and CPS worker to discuss emergency placement of her 38yo daughter. Pt agreed to allow pt's daughter stay with her biological father while she gets treatment for substance abuse issues and mental health issues. Pt given information about [employer] and was encourage to call. Pt also has phone interview at 3:30PM with [facility] for [facility] admission early next week.   Nocholas Damaso S. Ouida Sills, MSW, LCSW Clinical Social Worker 08/04/2018 2:26 PM

## 2018-08-04 NOTE — Progress Notes (Signed)
[facility]  08/04/2018 9:49 AM Primitivo GauzeJennifer A Kinyon  MRN:  [id number] Subjective:  Patient is seen she reports that her mood is improved she still has some residual depressive symptoms such as low energy and low motivation but she is prepared for discharge tomorrow.  She does not have thoughts of harming her self and no acute psychosis.  No involuntary movements. Less irritable than described previously, is hoping for some type of rehab, prepared for discharge tomorrow  Principal Problem: Severe recurrent major depression without psychotic features (HCC) Diagnosis: Principal Problem:   Severe recurrent major depression without psychotic features (HCC)  Total Time spent with patient: 20 minutes Past Medical History:  Past Medical History:  Diagnosis Date  . Abnormal Pap smear    HPV  . Anemia   . Anxiety   . Arthritis    knees  . Asthma    rarely uses inhaler- last times used was last week  . Bipolar 1 disorder (HCC)   . Bipolar 1 disorder, depressed, moderate (HCC) 10/04/2012  . Bipolar disorder (HCC)   . Carpal tunnel syndrome   . Depression   . Headache(784.0)   . Heart murmur    irregular heartbeat as child-cleared by cardiologist  . Hidradenitis 10/04/2012  . Insomnia   . Nausea and vomiting in pregnancy 10/04/2012  . Post partum depression   . Sleep apnea    Does not use CPAP    Past Surgical History:  Procedure Laterality Date  . BRAIN SURGERY  2009  . CARPAL TUNNEL RELEASE Left 12/28/2013   Procedure: CARPAL TUNNEL RELEASE;  Surgeon: Darreld McleanWayne Keeling, MD;  Location: [facility];  Service: Orthopedics;  Laterality: Left;  . CHOLECYSTECTOMY     Family History:  Family History  Problem Relation Age of Onset  . Hypertension Mother   . Heart disease Mother   . Mental illness Mother   . Thyroid disease Mother   . Dementia Mother   . Kidney disease Mother   . Cancer Mother        liver  . Hypertension Father   . Diabetes Father   . Heart disease Father   . Cancer  Daughter        Bone  . Heart disease Daughter   . Hypertension Maternal Grandmother   . Hypertension Maternal Grandfather   . Diabetes Maternal Grandfather   . Cancer Maternal Grandfather        Prostate, colon, bladder, lung  . Diabetes Paternal Grandmother   . Stroke Paternal Grandmother   . Diabetes Paternal Grandfather   . Cancer Paternal Grandfather        Bone and lung  . Diabetes Paternal Aunt   . Diabetes Paternal Aunt    Family Psychiatric  History: no new data Social History:  Social History   Substance and Sexual Activity  Alcohol Use No   Comment: Occ; not now     Social History   Substance and Sexual Activity  Drug Use Yes  . Types: Cocaine   Comment: crack    Social History   Socioeconomic History  . Marital status: Single    Spouse name: Not on file  . Number of children: Not on file  . Years of education: Not on file  . Highest education level: Not on file  Occupational History  . Not on file  Social Needs  . Financial resource strain: Not on file  . Food insecurity:    Worry: Not on file  Inability: Not on file  . Transportation needs:    Medical: Not on file    Non-medical: Not on file  Tobacco Use  . Smoking status: Never Smoker  . Smokeless tobacco: Never Used  Substance and Sexual Activity  . Alcohol use: No    Comment: Occ; not now  . Drug use: Yes    Types: Cocaine    Comment: crack  . Sexual activity: Yes    Birth control/protection: I.U.D.  Lifestyle  . Physical activity:    Days per week: Not on file    Minutes per session: Not on file  . Stress: Not on file  Relationships  . Social connections:    Talks on phone: Not on file    Gets together: Not on file    Attends religious service: Not on file    Active member of club or organization: Not on file    Attends meetings of clubs or organizations: Not on file    Relationship status: Not on file  Other Topics Concern  . Not on file  Social History Narrative  . Not  on file   Additional Social History:                         Sleep: Fair  Appetite:  Fair  Current Medications: Current Facility-Administered Medications  Medication Dose Route Frequency Provider Last Rate Last Dose  . acetaminophen (TYLENOL) tablet 650 mg  650 mg Oral Q6H PRN Nira Conn A, NP   650 mg at 08/03/18 0914  . alum & mag hydroxide-simeth (MAALOX/MYLANTA) 200-200-20 MG/5ML suspension 30 mL  30 mL Oral Q4H PRN Nira Conn A, NP      . chlordiazePOXIDE (LIBRIUM) capsule 25 mg  25 mg Oral TID PRN Antonieta Pert, MD   25 mg at 08/03/18 0011  . [START ON 08/05/2018] cloNIDine (CATAPRES) tablet 0.1 mg  0.1 mg Oral QAC breakfast Nira Conn A, NP      . dicyclomine (BENTYL) tablet 20 mg  20 mg Oral Q6H PRN Jackelyn Poling, NP      . Docosanol 10 % CREA 1 application  1 application Apply externally BID PRN Antonieta Pert, MD      . FLUoxetine (PROZAC) capsule 20 mg  20 mg Oral Daily Aldean Baker, NP   20 mg at 08/04/18 0802  . hydrOXYzine (ATARAX/VISTARIL) tablet 25 mg  25 mg Oral Q6H PRN Nira Conn A, NP   25 mg at 08/03/18 2151  . lidocaine (LIDODERM) 5 % 1 patch  1 patch Transdermal Daily PRN Aldean Baker, NP   1 patch at 08/03/18 1444  . loperamide (IMODIUM) capsule 2-4 mg  2-4 mg Oral PRN Nira Conn A, NP      . magnesium hydroxide (MILK OF MAGNESIA) suspension 30 mL  30 mL Oral Daily PRN Nira Conn A, NP   30 mL at 07/31/18 2154  . methocarbamol (ROBAXIN) tablet 500 mg  500 mg Oral Q8H PRN Nira Conn A, NP   500 mg at 08/03/18 2151  . naproxen (NAPROSYN) tablet 500 mg  500 mg Oral BID PRN Nira Conn A, NP   500 mg at 08/04/18 0802  . ondansetron (ZOFRAN-ODT) disintegrating tablet 4 mg  4 mg Oral Q6H PRN Nira Conn A, NP   4 mg at 07/31/18 1551  . polyethylene glycol (MIRALAX / GLYCOLAX) packet 17 g  17 g Oral Daily Jackelyn Poling, NP   Stopped  at 08/03/18 1041  . traZODone (DESYREL) tablet 50 mg  50 mg Oral QHS PRN Jackelyn Poling, NP   50 mg at  08/03/18 2151    Lab Results:  Results for orders placed or performed during the hospital encounter of 07/30/18 (from the past 48 hour(s))  Influenza panel by PCR (type A & B)     Status: None   Collection Time: 08/03/18 11:19 AM  Result Value Ref Range   Influenza A By PCR NEGATIVE NEGATIVE   Influenza B By PCR NEGATIVE NEGATIVE    Comment: (NOTE) The Xpert Xpress Flu assay is intended as an aid in the diagnosis of  influenza and should not be used as a sole basis for treatment.  This  assay is FDA approved for nasopharyngeal swab specimens only. Nasal  washings and aspirates are unacceptable for Xpert Xpress Flu testing. Performed at [facility], [address], [place], [place] [address]     Blood Alcohol level:  Lab Results  Component Value Date   ETH 149 (H) 07/29/2018    Metabolic Disorder Labs: Lab Results  Component Value Date   HGBA1C 5.3 07/31/2018   MPG 105.41 07/31/2018   MPG 99.67 08/08/2017   No results found for: PROLACTIN Lab Results  Component Value Date   CHOL 125 07/31/2018   TRIG 142 07/31/2018   HDL 32 (L) 07/31/2018   CHOLHDL 3.9 07/31/2018   VLDL 28 07/31/2018   LDLCALC 65 07/31/2018   LDLCALC UNABLE TO CALCULATE IF TRIGLYCERIDE OVER 400 mg/dL 60/45/4098    Physical Findings: AIMS: Facial and Oral Movements Muscles of Facial Expression: None, normal Lips and Perioral Area: None, normal Jaw: None, normal Tongue: None, normal,Extremity Movements Upper (arms, wrists, hands, fingers): None, normal Lower (legs, knees, ankles, toes): None, normal, Trunk Movements Neck, shoulders, hips: None, normal, Overall Severity Severity of abnormal movements (highest score from questions above): None, normal Incapacitation due to abnormal movements: None, normal Patient's awareness of abnormal movements (rate only patient's report): No Awareness, Dental Status Current problems with teeth and/or dentures?: No Does patient usually wear  dentures?: No  CIWA:  CIWA-Ar Total: 0 COWS:  COWS Total Score: 2  Musculoskeletal: Strength & Muscle Tone: within normal limits Gait & Station: normal Psychiatric Specialty Exam: Physical Exam no involuntary movements  ROS chronically overweight  Blood pressure 111/75, pulse 87, temperature 97.8 F (36.6 C), temperature source Oral, resp. rate 20, height 5\' 4"  (1.626 m), weight 108.4 kg.Body mass index is 41.02 kg/m.  General Appearance: Casual  Eye Contact:  Fair  Speech:  Normal Rate  Volume:  Normal  Mood:  Dysphoric  Affect:  Constricted  Thought Process:  Coherent  Orientation:  Full (Time, Place, and Person)  Thought Content:  Logical  Suicidal Thoughts:  No  Homicidal Thoughts:  No  Memory:  Immediate;   Fair  Judgement:  Fair  Insight:  Fair  Psychomotor Activity:  Normal  Concentration:  Concentration: Good  Recall:  Good  Fund of Knowledge:  Good  Language:  Good  Akathisia:  Negative  Handed:  Right  AIMS (if indicated):     Assets:  Communication Skills Resilience  ADL's:  Intact  Cognition:  WNL  Sleep:  Number of Hours: 5     Treatment Plan Summary: Daily contact with patient to assess and evaluate symptoms and progress in treatment, Medication management and Plan No change in current medications precautions or group scheduling we will plan for discharge tomorrow.  Malvin Johns,  MD 08/04/2018, 9:49 AM

## 2018-08-04 NOTE — [facility] Group Notes (Signed)
LCSW Group Therapy Note  08/04/2018 2:38 PM  Type of Therapy/Topic:  Group Therapy:  Balance in Life  Participation Level:  Did Not Attend  Description of Group:     This group will address the concept of balance and how it feels and looks when one is unbalanced. Patients will be encouraged to process areas in their lives that are out of balance and identify reasons for remaining unbalanced. Facilitators will guide patients in utilizing problem-solving interventions to address and correct the stressor making their life unbalanced. Understanding and applying boundaries will be explored and addressed for obtaining and maintaining a balanced life. Patients will be encouraged to explore ways to assertively make their unbalanced needs known to significant others in their lives, using other group members and facilitator for support and feedback.   Marian Sorrow, MSW Intern CSW Department Phone: [phone] Fax: [phone]

## 2018-08-05 MED ORDER — LIDOCAINE 5 % EX PTCH: 1.0000 | MEDICATED_PATCH | Freq: Every day | CUTANEOUS | 0 refills | Status: DC | PRN

## 2018-08-05 MED ORDER — DOCOSANOL 10 % EX CREA: 1.0000 "application " | TOPICAL_CREAM | Freq: Two times a day (BID) | CUTANEOUS | 0 refills | Status: DC | PRN

## 2018-08-05 MED ORDER — TRAZODONE HCL 50 MG PO TABS: 50.0000 mg | ORAL_TABLET | Freq: Every evening | ORAL | 0 refills | Status: DC | PRN

## 2018-08-05 MED ORDER — FLUOXETINE HCL 20 MG PO CAPS: 20.0000 mg | ORAL_CAPSULE | Freq: Every day | ORAL | 0 refills | Status: DC

## 2018-08-05 NOTE — Progress Notes (Signed)
Recreation Therapy Notes  Date:  1.24.20 Time: 0930 Location: [address] Dayroom  Group Topic: Stress Management  Goal Area(s) Addresses:  Patient will identify positive stress management techniques. Patient will identify benefits of using stress management post d/c.  Intervention: Stress Management  Activity :  Meditation.  LRT played a meditation that dealt with having choice.  Patients were to listen to the meditation to follow along and engage.  Education:  Stress Management, Discharge Planning.   Education Outcome: Acknowledges Education  Clinical Observations/Feedback:  Pt did not attend group.     Janijah Symons, LRT/CTRS         Triva Hueber A 08/05/2018 11:08 AM 

## 2018-08-05 NOTE — Tx Team (Signed)
Interdisciplinary Treatment and Diagnostic Plan Update  08/05/2018 Time of Session: 0830AM Tina Ross MRN: [id number]  Principal Diagnosis: Severe recurrent major depression without psychotic features [facility])  Secondary Diagnoses: Principal Problem:   Severe recurrent major depression without psychotic features (HCC)   Current Medications:  Current Facility-Administered Medications  Medication Dose Route Frequency Provider Last Rate Last Dose  . acetaminophen (TYLENOL) tablet 650 mg  650 mg Oral Q6H PRN Nira Conn A, NP   650 mg at 08/04/18 2206  . alum & mag hydroxide-simeth (MAALOX/MYLANTA) 200-200-20 MG/5ML suspension 30 mL  30 mL Oral Q4H PRN Nira Conn A, NP      . chlordiazePOXIDE (LIBRIUM) capsule 25 mg  25 mg Oral TID PRN Antonieta Pert, MD   25 mg at 08/03/18 0011  . cloNIDine (CATAPRES) tablet 0.1 mg  0.1 mg Oral QAC breakfast Jackelyn Poling, NP      . Docosanol 10 % CREA 1 application  1 application Apply externally BID PRN Antonieta Pert, MD      . FLUoxetine (PROZAC) capsule 20 mg  20 mg Oral Daily Aldean Baker, NP   20 mg at 08/04/18 0802  . lidocaine (LIDODERM) 5 % 1 patch  1 patch Transdermal Daily PRN Aldean Baker, NP   1 patch at 08/04/18 1636  . magnesium hydroxide (MILK OF MAGNESIA) suspension 30 mL  30 mL Oral Daily PRN Nira Conn A, NP   30 mL at 07/31/18 2154  . polyethylene glycol (MIRALAX / GLYCOLAX) packet 17 g  17 g Oral Daily Jackelyn Poling, NP   Stopped at 08/03/18 1041  . traZODone (DESYREL) tablet 50 mg  50 mg Oral QHS PRN Jackelyn Poling, NP   50 mg at 08/04/18 2203   PTA Medications: Medications Prior to Admission  Medication Sig Dispense Refill Last Dose  . ferrous sulfate 325 (65 FE) MG tablet Take 650 mg by mouth 2 (two) times daily.   Past Month at Unknown time  . ibuprofen (ADVIL,MOTRIN) 600 MG tablet Take 1 tablet (600 mg total) by mouth every 6 (six) hours as needed. (Patient taking differently: Take 600 mg by mouth every 6  (six) hours as needed (migraine). ) 30 tablet 0 Past Week at Unknown time  . levonorgestrel (MIRENA) 20 MCG/24HR IUD 1 each by Intrauterine route once.   current    Patient Stressors: Loss of family members Substance abuse  Patient Strengths: Ability for insight Capable of independent living Communication skills Motivation for treatment/growth Supportive family/friends  Treatment Modalities: Medication Management, Group therapy, Case management,  1 to 1 session with clinician, Psychoeducation, Recreational therapy.   Physician Treatment Plan for Primary Diagnosis: Severe recurrent major depression without psychotic features (HCC) Long Term Goal(s): Improvement in symptoms so as ready for discharge Improvement in symptoms so as ready for discharge   Short Term Goals: Ability to identify changes in lifestyle to reduce recurrence of condition will improve Ability to disclose and discuss suicidal ideas Ability to demonstrate self-control will improve Ability to identify and develop effective coping behaviors will improve Ability to identify triggers associated with substance abuse/mental health issues will improve Ability to disclose and discuss suicidal ideas Ability to demonstrate self-control will improve Ability to identify and develop effective coping behaviors will improve Ability to identify triggers associated with substance abuse/mental health issues will improve  Medication Management: Evaluate patient's response, side effects, and tolerance of medication regimen.  Therapeutic Interventions: 1 to 1 sessions, Unit Group sessions and Medication  administration.  Evaluation of Outcomes: Adequate for Discharge  Physician Treatment Plan for Secondary Diagnosis: Principal Problem:   Severe recurrent major depression without psychotic features (HCC)  Long Term Goal(s): Improvement in symptoms so as ready for discharge Improvement in symptoms so as ready for discharge   Short  Term Goals: Ability to identify changes in lifestyle to reduce recurrence of condition will improve Ability to disclose and discuss suicidal ideas Ability to demonstrate self-control will improve Ability to identify and develop effective coping behaviors will improve Ability to identify triggers associated with substance abuse/mental health issues will improve Ability to disclose and discuss suicidal ideas Ability to demonstrate self-control will improve Ability to identify and develop effective coping behaviors will improve Ability to identify triggers associated with substance abuse/mental health issues will improve     Medication Management: Evaluate patient's response, side effects, and tolerance of medication regimen.  Therapeutic Interventions: 1 to 1 sessions, Unit Group sessions and Medication administration.  Evaluation of Outcomes: Adequate for Discharge   RN Treatment Plan for Primary Diagnosis: Severe recurrent major depression without psychotic features (HCC) Long Term Goal(s): Knowledge of disease and therapeutic regimen to maintain health will improve  Short Term Goals: Ability to participate in decision making will improve, Ability to verbalize feelings will improve and Ability to identify and develop effective coping behaviors will improve  Medication Management: RN will administer medications as ordered by provider, will assess and evaluate patient's response and provide education to patient for prescribed medication. RN will report any adverse and/or side effects to prescribing provider.  Therapeutic Interventions: 1 on 1 counseling sessions, Psychoeducation, Medication administration, Evaluate responses to treatment, Monitor vital signs and CBGs as ordered, Perform/monitor CIWA, COWS, AIMS and Fall Risk screenings as ordered, Perform wound care treatments as ordered.  Evaluation of Outcomes: Adequate for Discharge   LCSW Treatment Plan for Primary Diagnosis: Severe  recurrent major depression without psychotic features (HCC) Long Term Goal(s): Safe transition to appropriate next level of care at discharge, Engage patient in therapeutic group addressing interpersonal concerns.  Short Term Goals: Engage patient in aftercare planning with referrals and resources, Increase ability to appropriately verbalize feelings, Increase emotional regulation, Identify triggers associated with mental health/substance abuse issues and Increase skills for wellness and recovery  Therapeutic Interventions: Assess for all discharge needs, 1 to 1 time with Social worker, Explore available resources and support systems, Assess for adequacy in community support network, Educate family and significant other(s) on suicide prevention, Complete Psychosocial Assessment, Interpersonal group therapy.  Evaluation of Outcomes: Adequate for Discharge   Progress in Treatment: Attending groups: Yes. Participating in groups: Yes. Taking medication as prescribed: Yes. Toleration medication: Yes. Family/Significant other contact made: Yes; Completed with pts Aunt Patient understands diagnosis: Yes. Discussing patient identified problems/goals with staff: Yes. Medical problems stabilized or resolved: Yes. Denies suicidal/homicidal ideation: Yes. Issues/concerns per patient self-inventory: No Other: n/a  New problem(s) identified: No, Describe:  n/a  New Short Term/Long Term Goal(s): detox, medication management for mood stabilization; development of comprehensive mental wellness/sobriety plan.    Patient Goals:  "To detox and get into a residential treatment program"  Discharge Plan or Barriers:  [facility] pamphlet, Mobile Crisis information, and AA/NA information provided to patient for additional community support and resources. [facility] [facility], [facility].    Reason for Continuation of Hospitalization:  none  Estimated Length of Stay: Friday 08/05/2018  Attendees: Patient:Tamikka Marden NobleKemp 08/05/2018 9:14 AM  Physician: Dr.  Otelia Santee MD 08/05/2018 9:14 AM  Nursing: Huntley Dec RN; Patrice RN 08/05/2018 9:14 AM  RN Care Manager:x 08/05/2018 9:14 AM  Social Worker: Corrie Mckusick LCSW 08/05/2018 9:14 AM  Recreational Therapist: x 08/05/2018 9:14 AM  Other: Marciano Sequin NP 08/05/2018 9:14 AM  Other:  08/05/2018 9:14 AM  Other: 08/05/2018 9:14 AM    Scribe for Treatment Team: Rona Ravens, LCSW 08/05/2018 9:14 AM

## 2018-08-05 NOTE — Progress Notes (Signed)
D: Patient observed in the dayroom with peers watching a movie. Interactions are appropriate. Patient states, "I'm leaving tomorrow and going to my dad's. That's a good situation except my stepmom. She's the one who called CPS. They told me today  CPS was coming to visit so I had a panic attack or 2." Patient's affect animated, mood anxious, pleasant. Reports L knee pain at a 5/10, no other physical complaints.   A: Medicated per orders, prn tylenol and vistaril given for complaints. Medication education provided. Level III obs in place for safety. Emotional support offered. Patient encouraged to complete Suicide Safety Plan before discharge. Encouraged to attend and participate in unit programming.    R: Patient verbalizes understanding of POC. On reassess, patient reports decrease in discomfort and anxiety. Patient offered quiet room for sleep as roommate is yelling out in sleep. Patient denies SI/HI/AVH and remains safe on level III obs. Will continue to monitor throughout the night.

## 2018-08-05 NOTE — [facility] Suicide Risk Assessment (Signed)
[facility] Discharge Suicide Risk Assessment   Principal Problem: Severe recurrent major depression without psychotic features [facility]) Discharge Diagnoses: Principal Problem:   Severe recurrent major depression without psychotic features (HCC)  Currently patient is alert oriented to person place time situation eager to go home, no cravings tremors or withdrawal no thoughts of harming self or others mood stable coherent cooperative so forth no involuntary movements.  No psychosis  Total Time spent with patient: 30 minutes Mental Status Per Nursing Assessment::   On Admission:  Suicidal ideation indicated by patient  Demographic Factors:  Caucasian  Loss Factors: NA  Historical Factors: NA  Risk Reduction Factors:   Positive social support  Continued Clinical Symptoms:  Personality Disorders:   Cluster B  Cognitive Features That Contribute To Risk:  None    Suicide Risk:  Minimal: No identifiable suicidal ideation.  Patients presenting with no risk factors but with morbid ruminations; may be classified as minimal risk based on the severity of the depressive symptoms  Follow-up Information    [facility]. Go on 08/10/2018.   Specialty:  Behavioral Health Why:  Please attend your hospital follow up appointment on 1/29 at 3:00p.  Contact information: [address] [place] [address] [phone]       [facility] Follow up.   Why:  If you choose to enter [facility], please follow-up at this clinic for medication management/outpatient mental health care. Walk in hours: M-F at 8:30AM. Thank you.  Contact information: [address] [place], [place] [address] Phone: [phone] Fax: [phone]          Plan Of Care/Follow-up recommendations:  Activity:  full  Tayllor Breitenstein, MD 08/05/2018, 1:04 PM

## 2018-08-05 NOTE — Progress Notes (Signed)
  [facility] Adult Case Management Discharge Plan :  Will you be returning to the same living situation after discharge:  Yes,  returning home until possible [facility] admission on Monday 08/08/18 At discharge, do you have transportation home?: Yes,  dad is picking her up Do you have the ability to pay for your medications: Yes,  [facility]  Release of information consent forms completed and submitted to medical records by CSW.  Patient to Follow up at: Follow-up Information    [facility]. Go on 08/10/2018.   Specialty:  Behavioral Health Why:  Please attend your hospital follow up appointment on 1/29 at 3:00p.  Contact information: [address] [place] [address] [phone]          Next level of care provider has access to [facility] Link:yes  Safety Planning and Suicide Prevention discussed: Yes,  SPE completed with pts Aunt; [facility] pamphlet and mobile crisis information provided to pt     Has patient been referred to the Quitline?: Patient refused referral  Patient has been referred for addiction treatment: Yes  Rona Ravens, LCSW 08/05/2018, 9:18 AM

## 2018-08-05 NOTE — Progress Notes (Signed)
Patient ID: Tina Ross, female   DOB: [birth year], 38 y.o.   MRN: [id number]  Pt discharged to lobby. Pt was stable and appreciative at that time. All papers and prescriptions were given and valuables returned. Verbal understanding expressed. Denies SI/HI and A/VH. Pt given opportunity to express concerns and ask questions.

## 2018-08-05 NOTE — Progress Notes (Signed)
Patient ID: Tina GauzeJennifer A Ross, female   DOB: 19-Feb-1981, 38 y.o.   MRN: [id number]  Pt currently presents with an appropriate affect and cooperative behavior. Pt mood is slightly irritable, at baseline. Pt reports to Clinical research associatewriter that their goal is to "figure out which medications I can take with me when I go." Pt reports that she is looking forward to seeing her daughter when she goes home. Pt reports good sleep with current medication regimen.   Pt provided with medications per providers orders. Pt's labs and vitals were monitored throughout the night. Pt supported emotionally and encouraged to express concerns and questions. Pt educated on medications and suicide prevention precautions.   Pt's safety ensured with 15 minute and environmental checks. Pt currently denies SI/HI and A/V hallucinations. Pt verbally agrees to seek staff if SI/HI or A/VH occurs and to consult with staff before acting on any harmful thoughts. Pt to be discharged per MD orders. Will continue POC.
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
## 2018-08-10 ENCOUNTER — Encounter (HOSPITAL_COMMUNITY): Payer: Self-pay | Admitting: Psychiatry

## 2018-08-10 ENCOUNTER — Ambulatory Visit (INDEPENDENT_AMBULATORY_CARE_PROVIDER_SITE_OTHER): Payer: Medicaid Other | Admitting: Psychiatry

## 2018-08-10 VITALS — BP 108/74 | HR 86 | Ht 64.0 in | Wt 239.0 lb

## 2018-08-10 DIAGNOSIS — F4310 Post-traumatic stress disorder, unspecified: Secondary | ICD-10-CM | POA: Insufficient documentation

## 2018-08-10 DIAGNOSIS — F331 Major depressive disorder, recurrent, moderate: Secondary | ICD-10-CM | POA: Insufficient documentation

## 2018-08-10 DIAGNOSIS — F1410 Cocaine abuse, uncomplicated: Secondary | ICD-10-CM | POA: Diagnosis not present

## 2018-08-10 DIAGNOSIS — F1129 Opioid dependence with unspecified opioid-induced disorder: Secondary | ICD-10-CM

## 2018-08-10 MED ORDER — TRAZODONE HCL 50 MG PO TABS: 50.0000 mg | ORAL_TABLET | Freq: Every evening | ORAL | 0 refills | Status: DC | PRN

## 2018-08-10 MED ORDER — FLUOXETINE HCL 20 MG PO CAPS: 20.0000 mg | ORAL_CAPSULE | Freq: Every day | ORAL | 0 refills | Status: DC

## 2018-08-10 MED ORDER — NALTREXONE HCL 50 MG PO TABS: 50.0000 mg | ORAL_TABLET | Freq: Every day | ORAL | 0 refills | Status: DC

## 2018-08-10 NOTE — Patient Instructions (Addendum)
1. Continue fluoxetine 20 mg daily  2. Continue Trazodone 50 mg at night  3. Start naltrexone 50 mg daily  3. Return to clinic in one month for 30 mins 4. Referral to therapy  5. CONTACT INFORMATION  What to do if you need to get in touch with someone regarding a psychiatric issue:  1. EMERGENCY: For psychiatric emergencies (if you are suicidal or if there are any other safety issues) call 911 and/or go to your nearest Emergency Room immediately.   2. IF YOU NEED SOMEONE TO TALK TO RIGHT NOW: Given my clinical responsibilities, I may not be able to speak with you over the phone for a prolonged period of time.  a. You may always call The National Suicide Prevention Lifeline at 1-800-273-TALK 517-753-5637(8255).  b. Your county of residence will also have local crisis services. [place]: [facility] at 712 [phone] (24 Hour Crisis Hotline)

## 2018-09-13 ENCOUNTER — Telehealth (HOSPITAL_COMMUNITY): Payer: Self-pay | Admitting: *Deleted

## 2018-09-13 ENCOUNTER — Ambulatory Visit (INDEPENDENT_AMBULATORY_CARE_PROVIDER_SITE_OTHER): Payer: Medicaid Other | Admitting: Psychiatry

## 2018-09-13 ENCOUNTER — Encounter (HOSPITAL_COMMUNITY): Payer: Self-pay | Admitting: Psychiatry

## 2018-09-13 DIAGNOSIS — F331 Major depressive disorder, recurrent, moderate: Secondary | ICD-10-CM

## 2018-09-13 DIAGNOSIS — F4310 Post-traumatic stress disorder, unspecified: Secondary | ICD-10-CM
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
## 2018-09-13 NOTE — Telephone Encounter (Signed)
Dr Vanetta Shawl Therapist Peggy brought Tina Ross to office after session & stated Tina Ross isn't sleeping well on 3-4 hrs nightly . Tina Ross asked if trazodone could be increased?  request refill on Prozac & is to let you know that the Naltrexone Isn't working -- not helping to take urge of wanting a pain medication & that her hair is coming out.  Next appointment  09-15-2018 & Tina Ross has been out medication since Saturday

## 2018-09-13 NOTE — Telephone Encounter (Signed)
Please verify with the pharmacy, there should be an order of Prozac on 2/24. She may discontinue naltrexone if it causes side effect. She can try trazodone up to 100 mg at night as needed for sleep (she should have enough medication); please advise the patient NOT to take with alcohol or pain pills to avoid significant sedation.

## 2018-09-14 NOTE — Telephone Encounter (Signed)
Note sent to nurse. 

## 2018-09-15 ENCOUNTER — Ambulatory Visit (HOSPITAL_COMMUNITY): Payer: Medicaid Other | Admitting: Psychiatry

## 2018-09-16 ENCOUNTER — Ambulatory Visit (HOSPITAL_COMMUNITY): Payer: Medicaid Other | Admitting: Psychiatry

## 2018-09-19 ENCOUNTER — Ambulatory Visit (HOSPITAL_COMMUNITY): Payer: Medicaid Other | Admitting: Psychiatry

## 2018-09-21 ENCOUNTER — Telehealth (HOSPITAL_COMMUNITY): Payer: Self-pay | Admitting: Psychiatry

## 2018-09-21 ENCOUNTER — Telehealth (HOSPITAL_COMMUNITY): Payer: Self-pay | Admitting: *Deleted

## 2018-09-21 NOTE — Telephone Encounter (Signed)
[facility] DSS stopped with questions on often is Lab work / Drug Screen is done on patient. Issues with UDS with things found in their test. Please call :Ena Dawley DSS # [phone] ext [phone]

## 2018-09-21 NOTE — Telephone Encounter (Signed)
Contacted Tina Ross DSS # [phone] ext [phone]. (According to [facility], she has consent form to release information to [place].)  They did urine drug screen twice; cocaine positive on two occasions. [name] is concerned that no medication was in her system. CPS has been involved due to substance use and no appropriate environment were offered to her children. No known violence. The patient is able to meet with her children only under the supervision of the patient's parents. They will see if there is any improvement in condition for next 90 days.   Will plan to offer information to [facility] after confirming that we have signed consent form from the patient.

## 2018-09-21 NOTE — Telephone Encounter (Signed)
Left voice message to contact the clinic.

## 2018-09-22 ENCOUNTER — Emergency Department (HOSPITAL_COMMUNITY)
Admission: EM | Admit: 2018-09-22 | Discharge: 2018-09-22 | Disposition: A | Discharge status: Home / Self Care | Payer: Medicaid Other | Attending: Emergency Medicine | Admitting: Emergency Medicine

## 2018-09-22 ENCOUNTER — Other Ambulatory Visit: Payer: Self-pay

## 2018-09-22 ENCOUNTER — Encounter (HOSPITAL_COMMUNITY): Payer: Self-pay

## 2018-09-22 DIAGNOSIS — J111 Influenza due to unidentified influenza virus with other respiratory manifestations: Secondary | ICD-10-CM

## 2018-09-22 DIAGNOSIS — R51 Headache: Secondary | ICD-10-CM | POA: Diagnosis not present

## 2018-09-22 DIAGNOSIS — R05 Cough: Secondary | ICD-10-CM | POA: Insufficient documentation

## 2018-09-22 DIAGNOSIS — Z79899 Other long term (current) drug therapy: Secondary | ICD-10-CM | POA: Insufficient documentation

## 2018-09-22 DIAGNOSIS — R509 Fever, unspecified: Secondary | ICD-10-CM | POA: Insufficient documentation

## 2018-09-22 DIAGNOSIS — R69 Illness, unspecified: Secondary | ICD-10-CM

## 2018-09-22 DIAGNOSIS — R0981 Nasal congestion: Secondary | ICD-10-CM | POA: Diagnosis not present

## 2018-09-22 DIAGNOSIS — J45909 Unspecified asthma, uncomplicated: Secondary | ICD-10-CM | POA: Diagnosis not present

## 2018-09-22 DIAGNOSIS — R197 Diarrhea, unspecified: Secondary | ICD-10-CM | POA: Diagnosis not present

## 2018-09-22 MED ORDER — SODIUM CHLORIDE 0.9% FLUSH: 3.0000 mL | Freq: Once | INTRAVENOUS | Status: DC

## 2018-09-22 MED ORDER — ONDANSETRON 8 MG PO TBDP: ORAL_TABLET | ORAL | 0 refills | Status: DC

## 2018-09-22 NOTE — ED Triage Notes (Signed)
Pt presents to ED with complaints of vomiting, diarrhea, and fever. Pt states she has had 20 episodes of vomiting in 24 hours. Pt c/o mid abdominal pain. Pt states it all started on Tuesday.

## 2018-09-22 NOTE — Discharge Instructions (Addendum)
Zofran as prescribed as needed for nausea.  Plenty of fluids and get plenty of rest.  Tylenol 1000 mg rotated with ibuprofen 600 mg every 4 hours as needed for pain or fever.  Return to the emergency department for severe abdominal pain, bloody stools, difficulty breathing, or for other new and concerning symptoms.

## 2018-09-22 NOTE — ED Notes (Signed)
EDP seen pt prior to RN, VO to d/c orders.  Plan to send home with prescription for nausea medication.

## 2018-09-22 NOTE — ED Provider Notes (Signed)
[facility] EMERGENCY DEPARTMENT Provider Note   CSN: [id number] Arrival date & time: 09/22/18  1345    History   Chief Complaint Chief Complaint  Patient presents with  . Emesis    HPI Tina Ross is a 38 y.o. female.     Patient is a 38 year old female with history of bipolar, asthma, anxiety.  She presents today for evaluation of cough, congestion, loose stools, headache, fever, and generally not feeling well for the past 2 days.  She states that 2 family members were recently told they had "the flu".  Patient denies any difficulty breathing.  She denies abdominal pain, bloody stools, or chest pain.  The history is provided by the patient.  Emesis  Severity:  Moderate Duration:  2 days Timing:  Constant Progression:  Worsening Chronicity:  New Recent urination:  Normal Relieved by:  Nothing Worsened by:  Nothing   Past Medical History:  Diagnosis Date  . Abnormal Pap smear    HPV  . Anemia   . Anxiety   . Arthritis    knees  . Asthma    rarely uses inhaler- last times used was last week  . Bipolar 1 disorder (HCC)   . Bipolar 1 disorder, depressed, moderate (HCC) 10/04/2012  . Bipolar disorder (HCC)   . Carpal tunnel syndrome   . Depression   . Headache(784.0)   . Heart murmur    irregular heartbeat as child-cleared by cardiologist  . Hidradenitis 10/04/2012  . Insomnia   . Nausea and vomiting in pregnancy 10/04/2012  . Post partum depression   . Sleep apnea    Does not use CPAP    Patient Active Problem List   Diagnosis Date Noted  . MDD (major depressive disorder), recurrent episode, moderate (HCC) 08/10/2018  . PTSD (post-traumatic stress disorder) 08/10/2018  . Severe recurrent major depression without psychotic features (HCC) 07/30/2018  . Hypertriglyceridemia 08/09/2017  . Atypical chest pain   . Chest pain 08/08/2017  . Sleep apnea 08/08/2017  . Insomnia 08/08/2017  . Asthma 08/08/2017  . Abnormal EKG 08/08/2017  . Boil, vulva  02/07/2014  . Encounter for IUD insertion 01/18/2014  . Abnormal cervical Pap smear with positive HPV DNA test 11/03/2012  . Hidradenitis 10/04/2012    Past Surgical History:  Procedure Laterality Date  . BRAIN SURGERY  2009  . CARPAL TUNNEL RELEASE Left 12/28/2013   Procedure: CARPAL TUNNEL RELEASE;  Surgeon: Darreld Mclean, MD;  Location: [facility];  Service: Orthopedics;  Laterality: Left;  . CHOLECYSTECTOMY       OB History    Gravida  4   Para  3   Term  3   Preterm      AB  1   Living  3     SAB  1   TAB      Ectopic      Multiple      Live Births  3            Home Medications    Prior to Admission medications   Medication Sig Start Date End Date Taking? Authorizing Provider  Docosanol (ABREVA) 10 % CREA Apply 1 application topically 2 (two) times daily as needed (reduce inflammation). Patient not taking: Reported on 09/13/2018 08/05/18   Aldean Baker, NP  FLUoxetine (PROZAC) 20 MG capsule Take 1 capsule (20 mg total) by mouth daily. For mood 09/05/18   Neysa Hotter, MD  levonorgestrel (MIRENA) 20 MCG/24HR IUD 1 each by Intrauterine route once.  [provider]  lidocaine (LIDODERM) 5 % Place 1 patch onto the skin daily as needed. Remove & Discard patch within 12 hours or as directed by MD Patient not taking: Reported on 09/13/2018 08/05/18   Aldean Baker, NP  naltrexone (DEPADE) 50 MG tablet Take 1 tablet (50 mg total) by mouth daily. 08/10/18   Neysa Hotter, MD  traZODone (DESYREL) 50 MG tablet Take 1 tablet (50 mg total) by mouth at bedtime as needed for sleep. 09/05/18   Neysa Hotter, MD    Family History Family History  Problem Relation Age of Onset  . Hypertension Mother   . Heart disease Mother   . Mental illness Mother   . Thyroid disease Mother   . Dementia Mother   . Kidney disease Mother   . Cancer Mother        liver  . Hypertension Father   . Diabetes Father   . Heart disease Father   . Alcohol abuse Father   . Cancer  Daughter        Bone  . Heart disease Daughter   . Hypertension Maternal Grandmother   . Hypertension Maternal Grandfather   . Diabetes Maternal Grandfather   . Cancer Maternal Grandfather        Prostate, colon, bladder, lung  . Diabetes Paternal Grandmother   . Stroke Paternal Grandmother   . Diabetes Paternal Grandfather   . Cancer Paternal Grandfather        Bone and lung  . Diabetes Paternal Aunt   . Diabetes Paternal Aunt     Social History Social History   Tobacco Use  . Smoking status: Never Smoker  . Smokeless tobacco: Never Used  Substance Use Topics  . Alcohol use: No  . Drug use: Not Currently    Types: Cocaine    Comment: last used pain pills, crack, and alcohol on 07/29/2018  per patient 's report on 09/12/3028     Allergies   Bee venom   Review of Systems Review of Systems  Gastrointestinal: Positive for vomiting.  All other systems reviewed and are negative.    Physical Exam Updated Vital Signs BP 134/75 (BP Location: Right Arm)   Pulse 95   Temp 99.4 F (37.4 C) (Oral)   Resp 19   Ht  (1.626 m)   Wt 104.3 kg   SpO2 100%   BMI 39.48 kg/m   Physical Exam Vitals signs and nursing note reviewed.  Constitutional:      General: She is not in acute distress.    Appearance: She is well-developed. She is not diaphoretic.  HENT:     Head: Normocephalic and atraumatic.     Mouth/Throat:     Mouth: Mucous membranes are moist.     Pharynx: No oropharyngeal exudate or posterior oropharyngeal erythema.  Neck:     Musculoskeletal: Normal range of motion and neck supple.  Cardiovascular:     Rate and Rhythm: Normal rate and regular rhythm.     Heart sounds: No murmur. No friction rub. No gallop.   Pulmonary:     Effort: Pulmonary effort is normal. No respiratory distress.     Breath sounds: Normal breath sounds. No wheezing.  Abdominal:     General: Bowel sounds are normal. There is no distension.     Palpations: Abdomen is soft.      Tenderness: There is no abdominal tenderness.  Musculoskeletal: Normal range of motion.  Skin:    General: Skin is warm and  dry.  Neurological:     Mental Status: She is alert and oriented to person, place, and time.      ED Treatments / Results  Labs (all labs ordered are listed, but only abnormal results are displayed) Labs Reviewed - No data to display  EKG None  Radiology No results found.  Procedures Procedures (including critical care time)  Medications Ordered in ED Medications - No data to display   Initial Impression / Assessment and Plan / ED Course  I have reviewed the triage vital signs and the nursing notes.  Pertinent labs & imaging results that were available during my care of the patient were reviewed by me and considered in my medical decision making (see chart for details).  Patient presents with symptoms that are most consistent with a viral illness, possibly influenza.  She appears well-hydrated, vitals are stable, and physical examination is unremarkable.  I see no indication for laboratory studies or hydration.  I see no evidence of dehydration in her vital signs or physical exam.  Patient will be discharged with Zofran, continued Tylenol/Motrin, plenty of fluids, and follow-up as needed.  Final Clinical Impressions(s) / ED Diagnoses   Final diagnoses:  None    ED Discharge Orders    None       Geoffery Lyons, MD 09/22/18 1503

## 2018-10-19 ENCOUNTER — Telehealth (HOSPITAL_COMMUNITY): Payer: Self-pay | Admitting: *Deleted

## 2018-10-19 NOTE — Telephone Encounter (Signed)
Dr Vanetta Shawl The patient's Social Worker -- Jerelene Redden # [phone]  Called with concerns about the patient/client. Requesting call back

## 2018-10-19 NOTE — Telephone Encounter (Signed)
Discussed with Jerelene Redden # [phone].   She is concerned about the patient care- cocaine was positive on the most recent UDS on 4/1. According to the patient and her father, they mentioned to Ms. Nunelly that they have been coming to this practice. According to the chart, she saw a therapist only once and this note Clinical research associate once in January. Ms.Nunelly is planning to contact the patient tomorrow.

## 2018-10-20 ENCOUNTER — Other Ambulatory Visit: Payer: Self-pay

## 2018-10-20 ENCOUNTER — Ambulatory Visit (HOSPITAL_COMMUNITY): Payer: Medicaid Other | Admitting: Psychiatry

## 2018-10-25 ENCOUNTER — Telehealth (HOSPITAL_COMMUNITY): Payer: Self-pay | Admitting: Psychiatry

## 2018-10-25 NOTE — Telephone Encounter (Signed)
Discussed with Jerelene Redden # [phone] given her request to call.   Serita Sheller asks if the patient came for appointment with a therapist. Maple Hudson also wants to have summary of encounters; it is discussed that patient is seen only once by this writer, and I would only be able to provide the note of that initial visit. Serita Sheller already has this note). Serita Sheller would speak with the patient tomorrow.

## 2018-11-02 ENCOUNTER — Telehealth (HOSPITAL_COMMUNITY): Payer: Self-pay | Admitting: *Deleted

## 2018-11-02 NOTE — Telephone Encounter (Signed)
DONE

## 2018-11-02 NOTE — Telephone Encounter (Signed)
Dr Vanetta Shawl Patient called front office asking for referral to the [facility] clinic. I called her to verify that she dose want   a referral to the [facility] clinic  & she said it was mentioned in her last visit. I then  Verified her  #  & asked to be sure to check for messages if calls are missed . She was going to activate her voice message so messages can be received.

## 2018-11-02 NOTE — Telephone Encounter (Signed)
Advise her to contact [facility] and inform them that she is interested in Suboxone management. [phone]   Address: [address],  [place], [place] [address]. The record can be forwarded to them upon request/if she signs consent form with them.

## 2018-11-03 ENCOUNTER — Other Ambulatory Visit: Payer: Self-pay

## 2018-11-03 ENCOUNTER — Encounter (HOSPITAL_COMMUNITY): Payer: Self-pay | Admitting: Psychiatry

## 2018-11-03 ENCOUNTER — Ambulatory Visit (INDEPENDENT_AMBULATORY_CARE_PROVIDER_SITE_OTHER): Payer: Medicaid Other | Admitting: Psychiatry

## 2018-11-03 DIAGNOSIS — F1410 Cocaine abuse, uncomplicated: Secondary | ICD-10-CM | POA: Diagnosis not present

## 2018-11-03 DIAGNOSIS — F331 Major depressive disorder, recurrent, moderate: Secondary | ICD-10-CM

## 2018-11-03 DIAGNOSIS — F4310 Post-traumatic stress disorder, unspecified: Secondary | ICD-10-CM | POA: Diagnosis not present
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
## 2018-11-17 ENCOUNTER — Other Ambulatory Visit: Payer: Self-pay

## 2018-11-17 ENCOUNTER — Ambulatory Visit (INDEPENDENT_AMBULATORY_CARE_PROVIDER_SITE_OTHER): Payer: Medicaid Other | Admitting: Psychiatry

## 2018-11-17 DIAGNOSIS — F4310 Post-traumatic stress disorder, unspecified: Secondary | ICD-10-CM

## 2018-11-17 DIAGNOSIS — F1410 Cocaine abuse, uncomplicated: Secondary | ICD-10-CM

## 2018-11-17 DIAGNOSIS — F331 Major depressive disorder, recurrent, moderate: Secondary | ICD-10-CM | POA: Diagnosis not present
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
## 2018-11-29 ENCOUNTER — Ambulatory Visit (INDEPENDENT_AMBULATORY_CARE_PROVIDER_SITE_OTHER): Payer: Medicaid Other | Admitting: Psychiatry

## 2018-11-29 ENCOUNTER — Encounter (HOSPITAL_COMMUNITY): Payer: Self-pay | Admitting: Psychiatry

## 2018-11-29 ENCOUNTER — Other Ambulatory Visit: Payer: Self-pay

## 2018-11-29 DIAGNOSIS — F1410 Cocaine abuse, uncomplicated: Secondary | ICD-10-CM | POA: Diagnosis not present

## 2018-11-29 DIAGNOSIS — F4310 Post-traumatic stress disorder, unspecified: Secondary | ICD-10-CM | POA: Diagnosis not present

## 2018-11-29 DIAGNOSIS — F1129 Opioid dependence with unspecified opioid-induced disorder: Secondary | ICD-10-CM | POA: Diagnosis not present

## 2018-11-29 DIAGNOSIS — F331 Major depressive disorder, recurrent, moderate: Secondary | ICD-10-CM

## 2018-11-29 MED ORDER — TRAZODONE HCL 100 MG PO TABS: 100.0000 mg | ORAL_TABLET | Freq: Every evening | ORAL | 0 refills | Status: DC | PRN

## 2018-11-29 MED ORDER — FLUOXETINE HCL 20 MG PO CAPS: 20.0000 mg | ORAL_CAPSULE | Freq: Every day | ORAL | 0 refills | Status: DC

## 2018-11-29 NOTE — Patient Instructions (Signed)
1. Continue fluoxetine 20 mg daily  2. Continue Trazodone 50-100 mg at night  3. Hold naltrexone 4. Next appointment: 6/16 at 10:20

## 2018-11-30 ENCOUNTER — Ambulatory Visit (HOSPITAL_COMMUNITY): Payer: Medicaid Other | Admitting: Psychiatry

## 2018-11-30 ENCOUNTER — Encounter (HOSPITAL_COMMUNITY): Payer: Self-pay | Admitting: Psychiatry

## 2018-12-14 ENCOUNTER — Other Ambulatory Visit: Payer: Self-pay

## 2018-12-14 ENCOUNTER — Ambulatory Visit (INDEPENDENT_AMBULATORY_CARE_PROVIDER_SITE_OTHER): Payer: Medicaid Other | Admitting: Psychiatry

## 2018-12-14 DIAGNOSIS — F4310 Post-traumatic stress disorder, unspecified: Secondary | ICD-10-CM | POA: Diagnosis not present

## 2018-12-14 DIAGNOSIS — F331 Major depressive disorder, recurrent, moderate: Secondary | ICD-10-CM
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
## 2018-12-27 ENCOUNTER — Encounter (HOSPITAL_COMMUNITY): Payer: Self-pay | Admitting: Psychiatry

## 2018-12-27 ENCOUNTER — Ambulatory Visit (INDEPENDENT_AMBULATORY_CARE_PROVIDER_SITE_OTHER): Payer: Medicaid Other | Admitting: Psychiatry

## 2018-12-27 ENCOUNTER — Other Ambulatory Visit: Payer: Self-pay

## 2018-12-27 DIAGNOSIS — F331 Major depressive disorder, recurrent, moderate: Secondary | ICD-10-CM | POA: Diagnosis not present

## 2018-12-27 DIAGNOSIS — F1129 Opioid dependence with unspecified opioid-induced disorder: Secondary | ICD-10-CM

## 2018-12-27 DIAGNOSIS — F4310 Post-traumatic stress disorder, unspecified: Secondary | ICD-10-CM | POA: Diagnosis not present

## 2018-12-27 DIAGNOSIS — F1410 Cocaine abuse, uncomplicated: Secondary | ICD-10-CM

## 2018-12-27 MED ORDER — FLUOXETINE HCL 40 MG PO CAPS: 40.0000 mg | ORAL_CAPSULE | Freq: Every day | ORAL | 0 refills | Status: DC

## 2018-12-27 MED ORDER — TRAZODONE HCL 100 MG PO TABS: 100.0000 mg | ORAL_TABLET | Freq: Every evening | ORAL | 0 refills | Status: DC | PRN

## 2018-12-27 NOTE — Patient Instructions (Signed)
1. Increase fluoxetine 40 mg daily  2. Continue Trazodone 50-100 mg at night  3. Next appointment: 8/4 at 10 AM

## 2018-12-28 ENCOUNTER — Other Ambulatory Visit: Payer: Self-pay

## 2018-12-28 ENCOUNTER — Ambulatory Visit (INDEPENDENT_AMBULATORY_CARE_PROVIDER_SITE_OTHER): Payer: Medicaid Other | Admitting: Psychiatry

## 2018-12-28 DIAGNOSIS — F4310 Post-traumatic stress disorder, unspecified: Secondary | ICD-10-CM | POA: Diagnosis not present

## 2018-12-28 DIAGNOSIS — F331 Major depressive disorder, recurrent, moderate: Secondary | ICD-10-CM
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
## 2019-01-11 ENCOUNTER — Telehealth (HOSPITAL_COMMUNITY): Payer: Self-pay | Admitting: Psychiatry

## 2019-01-11 NOTE — Telephone Encounter (Signed)
Therapist returned call to [place] worker, Jon Billings informed her a release of information will need to be signed by patient before information can be shared by therapist. She noted understanding.
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
## 2019-02-14 ENCOUNTER — Ambulatory Visit (HOSPITAL_COMMUNITY): Payer: Medicaid Other | Admitting: Psychiatry

## 2019-02-14 ENCOUNTER — Telehealth (HOSPITAL_COMMUNITY): Payer: Self-pay | Admitting: Psychiatry

## 2019-02-14 ENCOUNTER — Other Ambulatory Visit: Payer: Self-pay

## 2019-02-14 NOTE — Telephone Encounter (Signed)
Sent link for video visit through [facility]. Patient did not sign in. Called the patient  twice for appointment scheduled today. The patient did not answer the phone. Left voice message to contact the office.  

## 2019-02-15 ENCOUNTER — Other Ambulatory Visit (HOSPITAL_COMMUNITY): Payer: Self-pay | Admitting: Psychiatry

## 2019-02-15 MED ORDER — TRAZODONE HCL 100 MG PO TABS: 100.0000 mg | ORAL_TABLET | Freq: Every evening | ORAL | 0 refills | Status: DC | PRN

## 2019-02-15 MED ORDER — FLUOXETINE HCL 40 MG PO CAPS: 40.0000 mg | ORAL_CAPSULE | Freq: Every day | ORAL | 0 refills | Status: DC

## 2019-04-20 ENCOUNTER — Encounter: Payer: Self-pay | Admitting: Advanced Practice Midwife

## 2019-04-20 ENCOUNTER — Other Ambulatory Visit: Payer: Self-pay

## 2019-04-20 ENCOUNTER — Ambulatory Visit: Payer: Medicaid Other | Admitting: Advanced Practice Midwife

## 2019-04-20 VITALS — BP 127/81 | HR 87 | Wt 228.0 lb

## 2019-04-20 DIAGNOSIS — Z30432 Encounter for removal of intrauterine contraceptive device: Secondary | ICD-10-CM

## 2019-04-20 MED ORDER — PNV PRENATAL PLUS MULTIVITAMIN 27-1 MG PO TABS: 1.0000 | ORAL_TABLET | Freq: Every day | ORAL | 11 refills | Status: DC

## 2019-04-20 NOTE — Progress Notes (Signed)
  Tina Ross 38 y.o.  Vitals:   04/20/19 1621  BP: 127/81  Pulse: 87   Past Medical History:  Diagnosis Date  . Abnormal Pap smear    HPV  . Anemia   . Anxiety   . Arthritis    knees  . Asthma    rarely uses inhaler- last times used was last week  . Bipolar 1 disorder ([place])   . Bipolar 1 disorder, depressed, moderate ([place]) 10/04/2012  . Bipolar disorder ([place])   . Carpal tunnel syndrome   . Depression   . Headache(784.0)   . Heart murmur    irregular heartbeat as child-cleared by cardiologist  . Hidradenitis 10/04/2012  . Insomnia   . Nausea and vomiting in pregnancy 10/04/2012  . Post partum depression   . Sleep apnea    Does not use CPAP   Past Surgical History:  Procedure Laterality Date  . BRAIN SURGERY  2009  . CARPAL TUNNEL RELEASE Left 12/28/2013   Procedure: CARPAL TUNNEL RELEASE;  Surgeon: Sanjuana Kava, MD;  Location: [facility];  Service: Orthopedics;  Laterality: Left;  . CHOLECYSTECTOMY     family history includes Alcohol abuse in her father; Cancer in her daughter, maternal grandfather, mother, and paternal grandfather; Dementia in her mother; Diabetes in her father, maternal grandfather, paternal aunt, paternal aunt, paternal grandfather, and paternal grandmother; Heart disease in her daughter, father, and mother; Hypertension in her father, maternal grandfather, maternal grandmother, and mother; Kidney disease in her mother; Mental illness in her mother; Stroke in her paternal grandmother; Thyroid disease in her mother.  Current Outpatient Medications:  .  FLUoxetine (PROZAC) 40 MG capsule, Take 1 capsule (40 mg total) by mouth daily., Disp: 30 capsule, Rfl: 0 .  levonorgestrel (MIRENA) 20 MCG/24HR IUD, 1 each by Intrauterine route once., Disp: , Rfl:  .  traZODone (DESYREL) 100 MG tablet, Take 1 tablet (100 mg total) by mouth at bedtime as needed for sleep., Disp: 30 tablet, Rfl: 0 .  buprenorphine (SUBUTEX) 2 MG SUBL SL tablet, Place 20 mg under the tongue  daily., Disp: , Rfl:  .  Docosanol (ABREVA) 10 % CREA, Apply 1 application topically 2 (two) times daily as needed (reduce inflammation). (Patient not taking: Reported on 04/20/2019), Disp: 1 g, Rfl: 0 .  lidocaine (LIDODERM) 5 %, Place 1 patch onto the skin daily as needed. Remove & Discard patch within 12 hours or as directed by MD (Patient not taking: Reported on 09/13/2018), Disp: 30 patch, Rfl: 0 .  naltrexone (DEPADE) 50 MG tablet, Take 1 tablet (50 mg total) by mouth daily. (Patient not taking: Reported on 11/03/2018), Disp: 30 tablet, Rfl: 0 .  ondansetron (ZOFRAN ODT) 8 MG disintegrating tablet, 8mg  ODT q4 hours prn nausea (Patient not taking: Reported on 11/03/2018), Disp: 6 tablet, Rfl: 0 .  Prenatal Vit-Fe Fumarate-FA (PNV PRENATAL PLUS MULTIVITAMIN) 27-1 MG TABS, Take 1 tablet by mouth daily., Disp: 30 tablet, Rfl: 11    Here for IUD removal.  She had the mirena IUD placed 5 years ago and would like it removed to get pregnant. Her plans for future contraception are none.  A graves speculum was placed, and the strings were visible.  They were grasped with a curved Claiborne Billings and the IUD easily removed.  Pt given IUD removal f/u instructions.   PNV rx'd to pharmacy

## 2019-06-16 ENCOUNTER — Other Ambulatory Visit: Payer: Self-pay

## 2019-06-16 ENCOUNTER — Emergency Department (HOSPITAL_COMMUNITY): Payer: Medicaid Other

## 2019-06-16 ENCOUNTER — Encounter (HOSPITAL_COMMUNITY): Payer: Self-pay | Admitting: Emergency Medicine

## 2019-06-16 ENCOUNTER — Emergency Department (HOSPITAL_COMMUNITY)
Admission: EM | Admit: 2019-06-16 | Discharge: 2019-06-16 | Disposition: A | Discharge status: Home / Self Care | Payer: Medicaid Other | Attending: Emergency Medicine | Admitting: Emergency Medicine

## 2019-06-16 DIAGNOSIS — S62525A Nondisplaced fracture of distal phalanx of left thumb, initial encounter for closed fracture: Secondary | ICD-10-CM | POA: Insufficient documentation

## 2019-06-16 DIAGNOSIS — Y939 Activity, unspecified: Secondary | ICD-10-CM | POA: Insufficient documentation

## 2019-06-16 DIAGNOSIS — Z79899 Other long term (current) drug therapy: Secondary | ICD-10-CM | POA: Insufficient documentation

## 2019-06-16 DIAGNOSIS — W228XXA Striking against or struck by other objects, initial encounter: Secondary | ICD-10-CM | POA: Diagnosis not present

## 2019-06-16 DIAGNOSIS — S6992XA Unspecified injury of left wrist, hand and finger(s), initial encounter: Secondary | ICD-10-CM | POA: Diagnosis present

## 2019-06-16 DIAGNOSIS — J45909 Unspecified asthma, uncomplicated: Secondary | ICD-10-CM | POA: Insufficient documentation

## 2019-06-16 DIAGNOSIS — Y929 Unspecified place or not applicable: Secondary | ICD-10-CM | POA: Diagnosis not present

## 2019-06-16 DIAGNOSIS — Y999 Unspecified external cause status: Secondary | ICD-10-CM | POA: Insufficient documentation
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
## 2019-06-23 ENCOUNTER — Encounter: Payer: Self-pay | Admitting: Orthopedic Surgery

## 2019-06-23 ENCOUNTER — Other Ambulatory Visit: Payer: Self-pay

## 2019-06-23 ENCOUNTER — Ambulatory Visit: Payer: Medicaid Other | Admitting: Orthopedic Surgery

## 2019-06-23 VITALS — BP 141/80 | HR 99 | Temp 97.7°F | Ht 65.0 in | Wt 237.4 lb

## 2019-06-23 DIAGNOSIS — S62525A Nondisplaced fracture of distal phalanx of left thumb, initial encounter for closed fracture: Secondary | ICD-10-CM | POA: Diagnosis not present
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
## 2019-06-23 NOTE — Patient Instructions (Addendum)
NEEDS TO SEE DR Luna Glasgow FOR KNEE    SPLINT THUMB X 4 WEEKS OK TO REMOVE FOR BATHING

## 2019-07-03 ENCOUNTER — Ambulatory Visit: Payer: Medicaid Other | Admitting: Orthopaedic Surgery

## 2019-07-11 ENCOUNTER — Encounter: Payer: Self-pay | Admitting: Orthopaedic Surgery

## 2019-07-11 ENCOUNTER — Other Ambulatory Visit: Payer: Self-pay

## 2019-07-11 ENCOUNTER — Telehealth: Payer: Self-pay | Admitting: Orthopedic Surgery

## 2019-07-11 ENCOUNTER — Ambulatory Visit (INDEPENDENT_AMBULATORY_CARE_PROVIDER_SITE_OTHER): Payer: Medicaid Other | Admitting: Orthopaedic Surgery

## 2019-07-11 VITALS — BP 118/74 | HR 73 | Ht 65.0 in | Wt 228.0 lb

## 2019-07-11 DIAGNOSIS — M25562 Pain in left knee: Secondary | ICD-10-CM | POA: Diagnosis not present

## 2019-07-11 DIAGNOSIS — G8929 Other chronic pain: Secondary | ICD-10-CM

## 2019-07-11 NOTE — Telephone Encounter (Signed)
Patient would like her last work note changed to state that she is not to work with her left hand.  I told her that I would have to ask Dr. Aline Brochure for permission to do this for her.  Would you agree to her work note being changed to this?  Please advise  Thanks

## 2019-07-11 NOTE — Progress Notes (Signed)
Patient Tina Ross, female DOB:10-Oct-1980, 38 y.o. [id number]  Chief Complaint  Patient presents with  . Knee Pain    left knee pain     HPI  Tina Ross is a 38 y.o. female who has pain left knee.  Her brace got too big as she has lost weight.  She wants a new brace for the left knee.  She has no new trauma. She has pain and swelling.  She has no redness.   Body mass index is 37.94 kg/m.  ROS  Review of Systems  HENT: Negative for congestion.   Respiratory: Negative for cough and shortness of breath.   Cardiovascular: Negative for chest pain and leg swelling.  Endocrine: Negative for cold intolerance.  Musculoskeletal: Positive for arthralgias, back pain, gait problem, joint swelling and myalgias.  Allergic/Immunologic: Negative for environmental allergies.    All other systems reviewed and are negative.  The following is a summary of the past history medically, past history surgically, known current medicines, social history and family history.  This information is gathered electronically by the computer from prior information and documentation.  I review this each visit and have found including this information at this point in the chart is beneficial and informative.    Past Medical History:  Diagnosis Date  . Abnormal Pap smear    HPV  . Anemia   . Anxiety   . Arthritis    knees  . Asthma    rarely uses inhaler- last times used was last week  . Bipolar 1 disorder ([place])   . Bipolar 1 disorder, depressed, moderate ([place]) 10/04/2012  . Bipolar disorder ([place])   . Carpal tunnel syndrome   . Depression   . Headache(784.0)   . Heart murmur    irregular heartbeat as child-cleared by cardiologist  . Hidradenitis 10/04/2012  . Insomnia   . Nausea and vomiting in pregnancy 10/04/2012  . Post partum depression   . Sleep apnea    Does not use CPAP    Past Surgical History:  Procedure Laterality Date  . BRAIN SURGERY  2009  . CARPAL TUNNEL RELEASE Left  12/28/2013   Procedure: CARPAL TUNNEL RELEASE;  Surgeon: Sanjuana Kava, MD;  Location: [facility];  Service: Orthopedics;  Laterality: Left;  . CHOLECYSTECTOMY      Family History  Problem Relation Age of Onset  . Hypertension Mother   . Heart disease Mother   . Mental illness Mother   . Thyroid disease Mother   . Dementia Mother   . Kidney disease Mother   . Cancer Mother        liver  . Hypertension Father   . Diabetes Father   . Heart disease Father   . Alcohol abuse Father   . Cancer Daughter        Bone  . Heart disease Daughter   . Hypertension Maternal Grandmother   . Hypertension Maternal Grandfather   . Diabetes Maternal Grandfather   . Cancer Maternal Grandfather        Prostate, colon, bladder, lung  . Diabetes Paternal Grandmother   . Stroke Paternal Grandmother   . Diabetes Paternal Grandfather   . Cancer Paternal Grandfather        Bone and lung  . Diabetes Paternal Aunt   . Diabetes Paternal Aunt     Social History Social History   Tobacco Use  . Smoking status: Never Smoker  . Smokeless tobacco: Never Used  Substance Use Topics  . Alcohol  use: Not Currently  . Drug use: Not Currently    Types: Cocaine, "Crack" cocaine    Comment: last used last week    Allergies  Allergen Reactions  . Bee Venom Anaphylaxis    Current Outpatient Medications  Medication Sig Dispense Refill  . buprenorphine (SUBUTEX) 2 MG SUBL SL tablet Place 20 mg under the tongue daily.    . Docosanol (ABREVA) 10 % CREA Apply 1 application topically 2 (two) times daily as needed (reduce inflammation). (Patient not taking: Reported on 04/20/2019) 1 g 0  . FLUoxetine (PROZAC) 40 MG capsule Take 1 capsule (40 mg total) by mouth daily. 30 capsule 0  . levonorgestrel (MIRENA) 20 MCG/24HR IUD 1 each by Intrauterine route once.    . lidocaine (LIDODERM) 5 % Place 1 patch onto the skin daily as needed. Remove & Discard patch within 12 hours or as directed by MD 30 patch 0  . naltrexone  (DEPADE) 50 MG tablet Take 1 tablet (50 mg total) by mouth daily. (Patient not taking: Reported on 11/03/2018) 30 tablet 0  . ondansetron (ZOFRAN ODT) 8 MG disintegrating tablet 8mg  ODT q4 hours prn nausea (Patient not taking: Reported on 11/03/2018) 6 tablet 0  . Prenatal Vit-Fe Fumarate-FA (PNV PRENATAL PLUS MULTIVITAMIN) 27-1 MG TABS Take 1 tablet by mouth daily. 30 tablet 11  . traZODone (DESYREL) 100 MG tablet Take 1 tablet (100 mg total) by mouth at bedtime as needed for sleep. 30 tablet 0   No current facility-administered medications for this visit.     Physical Exam  Blood pressure 118/74, pulse 73, height 5\' 5"  (1.651 m), weight 228 lb (103.4 kg).  Constitutional: overall normal hygiene, normal nutrition, well developed, normal grooming, normal body habitus. Assistive device:none  Musculoskeletal: gait and station Limp left, muscle tone and strength are normal, no tremors or atrophy is present.  .  Neurological: coordination overall normal.  Deep tendon reflex/nerve stretch intact.  Sensation normal.  Cranial nerves II-XII intact.   Skin:   Normal overall no scars, lesions, ulcers or rashes. No psoriasis.  Psychiatric: Alert and oriented x 3.  Recent memory intact, remote memory unclear.  Normal mood and affect. Well groomed.  Good eye contact.  Cardiovascular: overall no swelling, no varicosities, no edema bilaterally, normal temperatures of the legs and arms, no clubbing, cyanosis and good capillary refill.  Lymphatic: palpation is normal.  Left knee has tenderness. She has ROM of 0 to 110, stable,limp left.  NV intact. All other systems reviewed and are negative   The patient has been educated about the nature of the problem(s) and counseled on treatment options.  The patient appeared to understand what I have discussed and is in agreement with it.  Encounter Diagnosis  Name Primary?  . Chronic pain of left knee Yes    PLAN Call if any problems.  Precautions  discussed.  Continue current medications.   Return to clinic 1 month   A new knee brace with stays given.  Electronically Signed 11/05/2018, MD 12/29/20202:26 PM

## 2019-07-12 ENCOUNTER — Encounter: Payer: Self-pay | Admitting: Orthopedic Surgery

## 2019-07-12 NOTE — Telephone Encounter (Signed)
ok 

## 2019-07-21 ENCOUNTER — Ambulatory Visit: Payer: Medicaid Other | Admitting: Orthopedic Surgery

## 2019-08-08 ENCOUNTER — Encounter: Payer: Self-pay | Admitting: Orthopaedic Surgery

## 2019-08-08 ENCOUNTER — Ambulatory Visit: Payer: Medicaid Other | Admitting: Orthopaedic Surgery

## 2019-08-11 ENCOUNTER — Other Ambulatory Visit: Payer: Self-pay

## 2019-08-11 ENCOUNTER — Ambulatory Visit: Payer: Medicaid Other | Admitting: Orthopedic Surgery

## 2019-08-11 ENCOUNTER — Ambulatory Visit: Payer: Medicaid Other

## 2019-08-11 VITALS — BP 121/73 | HR 80 | Temp 97.1°F | Ht 65.0 in | Wt 228.0 lb

## 2019-08-11 DIAGNOSIS — S62525D Nondisplaced fracture of distal phalanx of left thumb, subsequent encounter for fracture with routine healing: Secondary | ICD-10-CM

## 2019-08-11 MED ORDER — HYDROCODONE-ACETAMINOPHEN 7.5-325 MG PO TABS: 1.0000 | ORAL_TABLET | Freq: Four times a day (QID) | ORAL | 0 refills | Status: AC | PRN

## 2019-08-11 NOTE — Progress Notes (Signed)
follow-up  Chief Complaint  Patient presents with  . Follow-up    Recheck on left thumb, DOI 06-14-19.    Date of injury December 2  First visit in the office was December 11  Patient was diagnosed with a fracture of the left thumb  Treatment currently splint  X-ray shows slight separation of the distal tip of the fracture  She has tenderness at the tip of the finger after she hit it yesterday at work would like some pain medication  Her medications have been updated  Her finger bends fine looks fine the nail is coming off it will grow back  Meds ordered this encounter  Medications  . HYDROcodone-acetaminophen (NORCO) 7.5-325 MG tablet    Sig: Take 1 tablet by mouth every 6 (six) hours as needed for up to 5 days for moderate pain.    Dispense:  20 tablet    Refill:  0   Self-limited minor problem minimal moderate risk of morbidity from prescription management  She wants to be seen for carpal tunnel she has been released for the finger injury

## 2019-08-11 NOTE — Patient Instructions (Signed)
The nail will come off and grow back   You can have some medication for a few days   We are releasing you today

## 2019-08-14 ENCOUNTER — Telehealth: Payer: Self-pay | Admitting: Orthopedic Surgery

## 2019-08-14 NOTE — Telephone Encounter (Signed)
I have called her to advise.  

## 2019-08-14 NOTE — Telephone Encounter (Signed)
Patient called for refill; states Dr Romeo Apple had prescribed only 2 days of pain medication at time of visit 08/11/19: HYDROcodone-acetaminophen (NORCO) 7.5-325 MG tablet 20 tablet  Pharmacy is:  [facility]

## 2019-08-14 NOTE — Telephone Encounter (Signed)
No more meds and that was 5 days of medicine

## 2019-08-23 ENCOUNTER — Other Ambulatory Visit: Payer: Self-pay

## 2019-08-23 ENCOUNTER — Encounter: Payer: Self-pay | Admitting: Orthopedic Surgery

## 2019-08-23 ENCOUNTER — Ambulatory Visit: Payer: Medicaid Other | Admitting: Orthopedic Surgery

## 2019-08-23 VITALS — BP 144/83 | HR 75 | Ht 65.0 in | Wt 228.0 lb

## 2019-08-23 DIAGNOSIS — G5601 Carpal tunnel syndrome, right upper limb: Secondary | ICD-10-CM

## 2019-08-23 NOTE — Progress Notes (Signed)
New problem  Chief complaint pain paresthesias with numbness and tingling median nerve distribution right hand.  Patient status post left carpal tunnel release many years ago by Dr. Derinda Late  Patient says that she wants to go right ahead to surgery as the weakness and loss of function right upper extremity is causing her a lot of problems with work and her activities of daily living  Her review of systems is as follows  Review of Systems  Constitutional: Negative for fever and malaise/fatigue.  HENT: Negative for hearing loss.   Respiratory: Negative for shortness of breath.   Cardiovascular: Negative for chest pain.  Neurological: Positive for tingling, sensory change and weakness.   Past Medical History:  Diagnosis Date  . Abnormal Pap smear    HPV  . Anemia   . Anxiety   . Arthritis    knees  . Asthma    rarely uses inhaler- last times used was last week  . Bipolar 1 disorder ([place])   . Bipolar 1 disorder, depressed, moderate ([place]) 10/04/2012  . Bipolar disorder ([place])   . Carpal tunnel syndrome   . Depression   . Headache(784.0)   . Heart murmur    irregular heartbeat as child-cleared by cardiologist  . Hidradenitis 10/04/2012  . Insomnia   . Nausea and vomiting in pregnancy 10/04/2012  . Post partum depression   . Sleep apnea    Does not use CPAP   Past Surgical History:  Procedure Laterality Date  . BRAIN SURGERY  2009  . CARPAL TUNNEL RELEASE Left 12/28/2013   Procedure: CARPAL TUNNEL RELEASE;  Surgeon: Sanjuana Kava, MD;  Location: [facility];  Service: Orthopedics;  Laterality: Left;  . CHOLECYSTECTOMY      Family History  Problem Relation Age of Onset  . Hypertension Mother   . Heart disease Mother   . Mental illness Mother   . Thyroid disease Mother   . Dementia Mother   . Kidney disease Mother   . Cancer Mother        liver  . Hypertension Father   . Diabetes Father   . Heart disease Father   . Alcohol abuse Father   . Cancer Daughter        Bone  .  Heart disease Daughter   . Hypertension Maternal Grandmother   . Hypertension Maternal Grandfather   . Diabetes Maternal Grandfather   . Cancer Maternal Grandfather        Prostate, colon, bladder, lung  . Diabetes Paternal Grandmother   . Stroke Paternal Grandmother   . Diabetes Paternal Grandfather   . Cancer Paternal Grandfather        Bone and lung  . Diabetes Paternal Aunt   . Diabetes Paternal Aunt    Social History   Tobacco Use  . Smoking status: Never Smoker  . Smokeless tobacco: Never Used  Substance Use Topics  . Alcohol use: Not Currently  . Drug use: Not Currently    Types: Cocaine, "Crack" cocaine    Comment: last used last week    Physical Exam  39 year old female medium frame oriented x3 mood and affect are pleasant and anxious  Gait unaffected  Left upper extremity shows a previous carpal tunnel release she also recently injured her thumb range of motion has returned she has decent strength skin is clean  Right side she has pressure pain over the carpal tunnel decreased sensation in the median nerve distribution lymph nodes negative skin is intact except for an abrasion on  the long finger grip strength is poor stability the wrist is normal range of motion is intact  Encounter Diagnosis  Name Primary?  . Carpal tunnel syndrome of right wrist Yes

## 2019-09-08 NOTE — Patient Instructions (Signed)
Tina Ross  09/08/2019     @PREFPERIOPPHARMACY @   Your procedure is scheduled on  09/14/2019 .  Report to [name] at  0805  A.M.  Call this number if you have problems the morning of surgery:  [phone]   Remember:  Do not eat or drink after midnight.                        Take these medicines the morning of surgery with A SIP OF WATER  None    Do not wear jewelry, make-up or nail polish.  Do not wear lotions, powders, or perfumes, or deodorant.  Do not shave 48 hours prior to surgery.  Men may shave face and neck.  Do not bring valuables to the hospital.  [facility] is not responsible for any belongings or valuables.  Contacts, dentures or bridgework may not be worn into surgery.  Leave your suitcase in the car.  After surgery it may be brought to your room.  For patients admitted to the hospital, discharge time will be determined by your treatment team.  Patients discharged the day of surgery will not be allowed to drive home.   Name and phone number of your driver:   family Special instructions:  DO NOT smoke the day of your procedure.  Please read over the following fact sheets that you were given. Anesthesia Post-op Instructions and Care and Recovery After Surgery       Open Carpal Tunnel Release, Care After This sheet gives you information about how to care for yourself after your procedure. Your health care provider may also give you more specific instructions. If you have problems or questions, contact your health care provider. What can I expect after the procedure? After the procedure, it is common to have:  Wrist stiffness.  Bruising. Follow these instructions at home: Bathing  Do not take baths, swim, or use a hot tub until your health care provider approves. Ask your health care provider if you may take showers.  Keep your bandage (dressing) dry until your health care provider says it can be removed. If you have a splint or  brace:  Wear the splint or brace as told by your health care provider. You may need to wear it for 2-3 weeks. Remove it only as told by your health care provider.  Loosen the splint or brace if your fingers tingle, become numb, or turn cold and blue.  Keep the splint or brace clean.  If the splint or brace is not waterproof: ? Do not let it get wet. ? Cover it with a watertight covering when you take a bath or a shower. Incision care   Follow instructions from your health care provider about how to take care of your incision. Make sure you: ? Wash your hands with soap and water before you change your dressing. If soap and water are not available, use hand sanitizer. ? Change your dressing as told by your health care provider. ? Leave stitches (sutures), skin glue, or adhesive strips in place. These skin closures may need to stay in place for 2 weeks or longer. If adhesive strip edges start to loosen and curl up, you may trim the loose edges. Do not remove adhesive strips completely unless your health care provider tells you to do that.  Check your incision area every day for signs of infection. Check for: ? Redness, swelling, or pain. ?  Fluid or blood. ? Warmth. ? Pus or a bad smell. Managing pain, stiffness, and swelling   If directed, put ice on the affected area. ? If you have a removable splint or brace, remove it as told by your health care provider. ? Put ice in a plastic bag. ? Place a towel between your skin and the bag. ? Leave the ice on for 20 minutes, 2-3 times a day.  Move your fingers often to avoid stiffness and to lessen swelling.  Raise (elevate) your wrist above the level of your heart while you are sitting or lying down. Activity  Do not drive until your health care provider approves.  Do not drive or use heavy machinery while taking prescription pain medicine.  Return to your normal activities as told by your health care provider. Avoid activities that  cause pain.  If physical therapy was prescribed, do exercises as told by your therapist. Physical therapy can help you heal faster and regain movement. General instructions  Take over-the-counter and prescription medicines only as told by your health care provider.  If you are taking prescription pain medicine, take actions to prevent or treat constipation. Your health care provider may recommend that you: ? Drink enough fluid to keep your urine pale yellow. ? Eat foods that are high in fiber, such as fresh fruits and vegetables, whole grains, and beans. ? Limit foods that are high in fat and processed sugars, such as fried or sweet foods. ? Take an over-the-counter or prescription medicine for constipation.  Do not use any products that contain nicotine or tobacco, such as cigarettes and e-cigarettes. If you need help quitting, ask your health care provider.  Keep all follow-up visits as told by your health care provider and physical therapist. This is important. Contact a health care provider if:  You have redness or swelling around your incision.  You have fluid or blood coming from your incision.  Your incision feels warm to the touch.  You have pus or a bad smell coming from your incision.  You have a fever.  You have chills.  You have pain that does not get better with medicine.  Your carpal tunnel symptoms do not go away after 2 months.  Your carpal tunnel symptoms go away and then come back. Get help right away if:  You have pain or numbness that is getting worse.  Your fingers or fingertips become very pale or bluish in color.  You are not able to move your fingers. Summary  It is common to have wrist stiffness and bruising after a carpal tunnel release.  Icing and raising (elevating) your wrist may help to lessen swelling and pain.  Call your health care provider if you have a fever or notice any signs of infection in your incision area. This information is  not intended to replace advice given to you by your health care provider. Make sure you discuss any questions you have with your health care provider. Document Revised: 06/11/2017 Document Reviewed: 03/08/2017 Elsevier Patient Education  2020 [employer].  How to Use Chlorhexidine for Bathing Chlorhexidine gluconate (CHG) is a germ-killing (antiseptic) solution that is used to clean the skin. It can get rid of the bacteria that normally live on the skin and can keep them away for about 24 hours. To clean your skin with CHG, you may be given:  A CHG solution to use in the shower or as part of a sponge bath.  A prepackaged cloth that contains  CHG. Cleaning your skin with CHG may help lower the risk for infection:  While you are staying in the intensive care unit of the hospital.  If you have a vascular access, such as a central line, to provide short-term or long-term access to your veins.  If you have a catheter to drain urine from your bladder.  If you are on a ventilator. A ventilator is a machine that helps you breathe by moving air in and out of your lungs.  After surgery. What are the risks? Risks of using CHG include:  A skin reaction.  Hearing loss, if CHG gets in your ears.  Eye injury, if CHG gets in your eyes and is not rinsed out.  The CHG product catching fire. Make sure that you avoid smoking and flames after applying CHG to your skin. Do not use CHG:  If you have a chlorhexidine allergy or have previously reacted to chlorhexidine.  On babies younger than 62 months of age. How to use CHG solution  Use CHG only as told by your health care provider, and follow the instructions on the label.  Use the full amount of CHG as directed. Usually, this is one bottle. During a shower Follow these steps when using CHG solution during a shower (unless your health care provider gives you different instructions): 1. Start the shower. 2. Use your normal soap and shampoo to  wash your face and hair. 3. Turn off the shower or move out of the shower stream. 4. Pour the CHG onto a clean washcloth. Do not use any type of brush or rough-edged sponge. 5. Starting at your neck, lather your body down to your toes. Make sure you follow these instructions: ? If you will be having surgery, pay special attention to the part of your body where you will be having surgery. Scrub this area for at least 1 minute. ? Do not use CHG on your head or face. If the solution gets into your ears or eyes, rinse them well with water. ? Avoid your genital area. ? Avoid any areas of skin that have broken skin, cuts, or scrapes. ? Scrub your back and under your arms. Make sure to wash skin folds. 6. Let the lather sit on your skin for 1-2 minutes or as long as told by your health care provider. 7. Thoroughly rinse your entire body in the shower. Make sure that all body creases and crevices are rinsed well. 8. Dry off with a clean towel. Do not put any substances on your body afterward-such as powder, lotion, or perfume-unless you are told to do so by your health care provider. Only use lotions that are recommended by the manufacturer. 9. Put on clean clothes or pajamas. 10. If it is the night before your surgery, sleep in clean sheets.  During a sponge bath Follow these steps when using CHG solution during a sponge bath (unless your health care provider gives you different instructions): 1. Use your normal soap and shampoo to wash your face and hair. 2. Pour the CHG onto a clean washcloth. 3. Starting at your neck, lather your body down to your toes. Make sure you follow these instructions: ? If you will be having surgery, pay special attention to the part of your body where you will be having surgery. Scrub this area for at least 1 minute. ? Do not use CHG on your head or face. If the solution gets into your ears or eyes, rinse them well with water. ?  Avoid your genital area. ? Avoid any areas  of skin that have broken skin, cuts, or scrapes. ? Scrub your back and under your arms. Make sure to wash skin folds. 4. Let the lather sit on your skin for 1-2 minutes or as long as told by your health care provider. 5. Using a different clean, wet washcloth, thoroughly rinse your entire body. Make sure that all body creases and crevices are rinsed well. 6. Dry off with a clean towel. Do not put any substances on your body afterward-such as powder, lotion, or perfume-unless you are told to do so by your health care provider. Only use lotions that are recommended by the manufacturer. 7. Put on clean clothes or pajamas. 8. If it is the night before your surgery, sleep in clean sheets. How to use CHG prepackaged cloths  Only use CHG cloths as told by your health care provider, and follow the instructions on the label.  Use the CHG cloth on clean, dry skin.  Do not use the CHG cloth on your head or face unless your health care provider tells you to.  When washing with the CHG cloth: ? Avoid your genital area. ? Avoid any areas of skin that have broken skin, cuts, or scrapes. Before surgery Follow these steps when using a CHG cloth to clean before surgery (unless your health care provider gives you different instructions): 1. Using the CHG cloth, vigorously scrub the part of your body where you will be having surgery. Scrub using a back-and-forth motion for 3 minutes. The area on your body should be completely wet with CHG when you are done scrubbing. 2. Do not rinse. Discard the cloth and let the area air-dry. Do not put any substances on the area afterward, such as powder, lotion, or perfume. 3. Put on clean clothes or pajamas. 4. If it is the night before your surgery, sleep in clean sheets.  For general bathing Follow these steps when using CHG cloths for general bathing (unless your health care provider gives you different instructions). 1. Use a separate CHG cloth for each area of your  body. Make sure you wash between any folds of skin and between your fingers and toes. Wash your body in the following order, switching to a new cloth after each step: ? The front of your neck, shoulders, and chest. ? Both of your arms, under your arms, and your hands. ? Your stomach and groin area, avoiding the genitals. ? Your right leg and foot. ? Your left leg and foot. ? The back of your neck, your back, and your buttocks. 2. Do not rinse. Discard the cloth and let the area air-dry. Do not put any substances on your body afterward-such as powder, lotion, or perfume-unless you are told to do so by your health care provider. Only use lotions that are recommended by the manufacturer. 3. Put on clean clothes or pajamas. Contact a health care provider if:  Your skin gets irritated after scrubbing.  You have questions about using your solution or cloth. Get help right away if:  Your eyes become very red or swollen.  Your eyes itch badly.  Your skin itches badly and is red or swollen.  Your hearing changes.  You have trouble seeing.  You have swelling or tingling in your mouth or throat.  You have trouble breathing.  You swallow any chlorhexidine. Summary  Chlorhexidine gluconate (CHG) is a germ-killing (antiseptic) solution that is used to clean the skin. Cleaning your skin  with CHG may help to lower your risk for infection.  You may be given CHG to use for bathing. It may be in a bottle or in a prepackaged cloth to use on your skin. Carefully follow your health care provider's instructions and the instructions on the product label.  Do not use CHG if you have a chlorhexidine allergy.  Contact your health care provider if your skin gets irritated after scrubbing. This information is not intended to replace advice given to you by your health care provider. Make sure you discuss any questions you have with your health care provider. Document Revised: 09/15/2018 Document Reviewed:  05/27/2017 Elsevier Patient Education  2020 [place] After These instructions provide you with information about caring for yourself after your procedure. Your health care provider may also give you more specific instructions. Your treatment has been planned according to current medical practices, but problems sometimes occur. Call your health care provider if you have any problems or questions after your procedure. What can I expect after the procedure? After your procedure, you may:  Feel sleepy for several hours.  Feel clumsy and have poor balance for several hours.  Feel forgetful about what happened after the procedure.  Have poor judgment for several hours.  Feel nauseous or vomit.  Have a sore throat if you had a breathing tube during the procedure. Follow these instructions at home: For at least 24 hours after the procedure:      Have a responsible adult stay with you. It is important to have someone help care for you until you are awake and alert.  Rest as needed.  Do not: ? Participate in activities in which you could fall or become injured. ? Drive. ? Use heavy machinery. ? Drink alcohol. ? Take sleeping pills or medicines that cause drowsiness. ? Make important decisions or sign legal documents. ? Take care of children on your own. Eating and drinking  Follow the diet that is recommended by your health care provider.  If you vomit, drink water, juice, or soup when you can drink without vomiting.  Make sure you have little or no nausea before eating solid foods. General instructions  Take over-the-counter and prescription medicines only as told by your health care provider.  If you have sleep apnea, surgery and certain medicines can increase your risk for breathing problems. Follow instructions from your health care provider about wearing your sleep device: ? Anytime you are sleeping, including during daytime naps. ?  While taking prescription pain medicines, sleeping medicines, or medicines that make you drowsy.  If you smoke, do not smoke without supervision.  Keep all follow-up visits as told by your health care provider. This is important. Contact a health care provider if:  You keep feeling nauseous or you keep vomiting.  You feel light-headed.  You develop a rash.  You have a fever. Get help right away if:  You have trouble breathing. Summary  For several hours after your procedure, you may feel sleepy and have poor judgment.  Have a responsible adult stay with you for at least 24 hours or until you are awake and alert. This information is not intended to replace advice given to you by your health care provider. Make sure you discuss any questions you have with your health care provider. Document Revised: 09/27/2017 Document Reviewed: 10/20/2015 Elsevier Patient Education  [place].

## 2019-09-12 ENCOUNTER — Other Ambulatory Visit (HOSPITAL_COMMUNITY)
Admission: RE | Admit: 2019-09-12 | Discharge: 2019-09-12 | Disposition: A | Discharge status: Home / Self Care | Payer: Medicaid Other | Source: Ambulatory Visit | Attending: Orthopedic Surgery | Admitting: Orthopedic Surgery

## 2019-09-12 ENCOUNTER — Other Ambulatory Visit: Payer: Self-pay

## 2019-09-12 ENCOUNTER — Encounter (HOSPITAL_COMMUNITY): Payer: Self-pay

## 2019-09-12 ENCOUNTER — Telehealth: Payer: Self-pay | Admitting: Radiology

## 2019-09-12 ENCOUNTER — Encounter (HOSPITAL_COMMUNITY)
Admission: RE | Admit: 2019-09-12 | Discharge: 2019-09-12 | Disposition: A | Discharge status: Home / Self Care | Payer: Medicaid Other | Source: Ambulatory Visit | Attending: Orthopedic Surgery | Admitting: Orthopedic Surgery

## 2019-09-12 NOTE — Telephone Encounter (Signed)
Patient has fever and upper respiratory symptoms she wants to [facility] surgery   I will call her to [facility]

## 2019-09-14 ENCOUNTER — Ambulatory Visit (HOSPITAL_COMMUNITY): Admission: RE | Admit: 2019-09-14 | Payer: Medicaid Other | Source: Home / Self Care | Admitting: Orthopedic Surgery

## 2019-09-14 ENCOUNTER — Encounter (HOSPITAL_COMMUNITY): Admission: RE | Payer: Self-pay | Source: Home / Self Care

## 2019-09-14 SURGERY — CARPAL TUNNEL RELEASE
Anesthesia: Choice | Laterality: Right

## 2019-09-14 NOTE — Telephone Encounter (Signed)
Left message for her to call me back to [facility]

## 2019-09-19 ENCOUNTER — Other Ambulatory Visit: Payer: Self-pay | Admitting: Orthopedic Surgery

## 2019-09-19 ENCOUNTER — Telehealth: Payer: Self-pay | Admitting: Orthopedic Surgery

## 2019-09-19 NOTE — Telephone Encounter (Signed)
She asks that you call her again.

## 2019-09-19 NOTE — Addendum Note (Signed)
Addended byCaffie Damme on: 09/19/2019 04:30 PM   Modules accepted: Orders, SmartSet

## 2019-09-19 NOTE — Telephone Encounter (Signed)
I called  She wants to RS surgery  March 18th  Right carpal tunnel release

## 2019-09-22 NOTE — Patient Instructions (Signed)
Tina Ross  09/22/2019     @PREFPERIOPPHARMACY @   Your procedure is scheduled on  09/28/2019  Report to 09/30/2019 at  0845  A.M.  Call this number if you have problems the morning of surgery:  [phone]   Remember:  Do not eat or drink after midnight.                       Take these medicines the morning of surgery with A SIP OF WATER  prozac.    Do not wear jewelry, make-up or nail polish.  Do not wear lotions, powders, or perfumes. Please wear deodorant and brush your teeth.  Do not shave 48 hours prior to surgery.  Men may shave face and neck.  Do not bring valuables to the hospital.  [facility] is not responsible for any belongings or valuables.  Contacts, dentures or bridgework may not be worn into surgery.  Leave your suitcase in the car.  After surgery it may be brought to your room.  For patients admitted to the hospital, discharge time will be determined by your treatment team.  Patients discharged the day of surgery will not be allowed to drive home.   Name and phone number of your driver:   family Special instructions:  DO NOT smoke the day of your surgery.  Please read over the following fact sheets that you were given. Anesthesia Post-op Instructions and Care and Recovery After Surgery       Open Carpal Tunnel Release, Care After This sheet gives you information about how to care for yourself after your procedure. Your health care provider may also give you more specific instructions. If you have problems or questions, contact your health care provider. What can I expect after the procedure? After the procedure, it is common to have:  Wrist stiffness.  Bruising. Follow these instructions at home: Bathing  Do not take baths, swim, or use a hot tub until your health care provider approves. Ask your health care provider if you may take showers.  Keep your bandage (dressing) dry until your health care provider says it can be  removed. If you have a splint or brace:  Wear the splint or brace as told by your health care provider. You may need to wear it for 2-3 weeks. Remove it only as told by your health care provider.  Loosen the splint or brace if your fingers tingle, become numb, or turn cold and blue.  Keep the splint or brace clean.  If the splint or brace is not waterproof: ? Do not let it get wet. ? Cover it with a watertight covering when you take a bath or a shower. Incision care   Follow instructions from your health care provider about how to take care of your incision. Make sure you: ? Wash your hands with soap and water before you change your dressing. If soap and water are not available, use hand sanitizer. ? Change your dressing as told by your health care provider. ? Leave stitches (sutures), skin glue, or adhesive strips in place. These skin closures may need to stay in place for 2 weeks or longer. If adhesive strip edges start to loosen and curl up, you may trim the loose edges. Do not remove adhesive strips completely unless your health care provider tells you to do that.  Check your incision area every day for signs of infection. Check for: ?  Redness, swelling, or pain. ? Fluid or blood. ? Warmth. ? Pus or a bad smell. Managing pain, stiffness, and swelling   If directed, put ice on the affected area. ? If you have a removable splint or brace, remove it as told by your health care provider. ? Put ice in a plastic bag. ? Place a towel between your skin and the bag. ? Leave the ice on for 20 minutes, 2-3 times a day.  Move your fingers often to avoid stiffness and to lessen swelling.  Raise (elevate) your wrist above the level of your heart while you are sitting or lying down. Activity  Do not drive until your health care provider approves.  Do not drive or use heavy machinery while taking prescription pain medicine.  Return to your normal activities as told by your health  care provider. Avoid activities that cause pain.  If physical therapy was prescribed, do exercises as told by your therapist. Physical therapy can help you heal faster and regain movement. General instructions  Take over-the-counter and prescription medicines only as told by your health care provider.  If you are taking prescription pain medicine, take actions to prevent or treat constipation. Your health care provider may recommend that you: ? Drink enough fluid to keep your urine pale yellow. ? Eat foods that are high in fiber, such as fresh fruits and vegetables, whole grains, and beans. ? Limit foods that are high in fat and processed sugars, such as fried or sweet foods. ? Take an over-the-counter or prescription medicine for constipation.  Do not use any products that contain nicotine or tobacco, such as cigarettes and e-cigarettes. If you need help quitting, ask your health care provider.  Keep all follow-up visits as told by your health care provider and physical therapist. This is important. Contact a health care provider if:  You have redness or swelling around your incision.  You have fluid or blood coming from your incision.  Your incision feels warm to the touch.  You have pus or a bad smell coming from your incision.  You have a fever.  You have chills.  You have pain that does not get better with medicine.  Your carpal tunnel symptoms do not go away after 2 months.  Your carpal tunnel symptoms go away and then come back. Get help right away if:  You have pain or numbness that is getting worse.  Your fingers or fingertips become very pale or bluish in color.  You are not able to move your fingers. Summary  It is common to have wrist stiffness and bruising after a carpal tunnel release.  Icing and raising (elevating) your wrist may help to lessen swelling and pain.  Call your health care provider if you have a fever or notice any signs of infection in your  incision area. This information is not intended to replace advice given to you by your health care provider. Make sure you discuss any questions you have with your health care provider. Document Revised: 06/11/2017 Document Reviewed: 03/08/2017 Elsevier Patient Education  2020 [place] After These instructions provide you with information about caring for yourself after your procedure. Your health care provider may also give you more specific instructions. Your treatment has been planned according to current medical practices, but problems sometimes occur. Call your health care provider if you have any problems or questions after your procedure. What can I expect after the procedure? After your procedure, you may:  Feel sleepy for several hours.  Feel clumsy and have poor balance for several hours.  Feel forgetful about what happened after the procedure.  Have poor judgment for several hours.  Feel nauseous or vomit.  Have a sore throat if you had a breathing tube during the procedure. Follow these instructions at home: For at least 24 hours after the procedure:      Have a responsible adult stay with you. It is important to have someone help care for you until you are awake and alert.  Rest as needed.  Do not: ? Participate in activities in which you could fall or become injured. ? Drive. ? Use heavy machinery. ? Drink alcohol. ? Take sleeping pills or medicines that cause drowsiness. ? Make important decisions or sign legal documents. ? Take care of children on your own. Eating and drinking  Follow the diet that is recommended by your health care provider.  If you vomit, drink water, juice, or soup when you can drink without vomiting.  Make sure you have little or no nausea before eating solid foods. General instructions  Take over-the-counter and prescription medicines only as told by your health care provider.  If you have  sleep apnea, surgery and certain medicines can increase your risk for breathing problems. Follow instructions from your health care provider about wearing your sleep device: ? Anytime you are sleeping, including during daytime naps. ? While taking prescription pain medicines, sleeping medicines, or medicines that make you drowsy.  If you smoke, do not smoke without supervision.  Keep all follow-up visits as told by your health care provider. This is important. Contact a health care provider if:  You keep feeling nauseous or you keep vomiting.  You feel light-headed.  You develop a rash.  You have a fever. Get help right away if:  You have trouble breathing. Summary  For several hours after your procedure, you may feel sleepy and have poor judgment.  Have a responsible adult stay with you for at least 24 hours or until you are awake and alert. This information is not intended to replace advice given to you by your health care provider. Make sure you discuss any questions you have with your health care provider. Document Revised: 09/27/2017 Document Reviewed: 10/20/2015 Elsevier Patient Education  2020 [employer]. How to Use Chlorhexidine for Bathing Chlorhexidine gluconate (CHG) is a germ-killing (antiseptic) solution that is used to clean the skin. It can get rid of the bacteria that normally live on the skin and can keep them away for about 24 hours. To clean your skin with CHG, you may be given:  A CHG solution to use in the shower or as part of a sponge bath.  A prepackaged cloth that contains CHG. Cleaning your skin with CHG may help lower the risk for infection:  While you are staying in the intensive care unit of the hospital.  If you have a vascular access, such as a central line, to provide short-term or long-term access to your veins.  If you have a catheter to drain urine from your bladder.  If you are on a ventilator. A ventilator is a machine that helps you  breathe by moving air in and out of your lungs.  After surgery. What are the risks? Risks of using CHG include:  A skin reaction.  Hearing loss, if CHG gets in your ears.  Eye injury, if CHG gets in your eyes and is not rinsed out.  The CHG product  catching fire. Make sure that you avoid smoking and flames after applying CHG to your skin. Do not use CHG:  If you have a chlorhexidine allergy or have previously reacted to chlorhexidine.  On babies younger than 43 months of age. How to use CHG solution  Use CHG only as told by your health care provider, and follow the instructions on the label.  Use the full amount of CHG as directed. Usually, this is one bottle. During a shower Follow these steps when using CHG solution during a shower (unless your health care provider gives you different instructions): 1. Start the shower. 2. Use your normal soap and shampoo to wash your face and hair. 3. Turn off the shower or move out of the shower stream. 4. Pour the CHG onto a clean washcloth. Do not use any type of brush or rough-edged sponge. 5. Starting at your neck, lather your body down to your toes. Make sure you follow these instructions: ? If you will be having surgery, pay special attention to the part of your body where you will be having surgery. Scrub this area for at least 1 minute. ? Do not use CHG on your head or face. If the solution gets into your ears or eyes, rinse them well with water. ? Avoid your genital area. ? Avoid any areas of skin that have broken skin, cuts, or scrapes. ? Scrub your back and under your arms. Make sure to wash skin folds. 6. Let the lather sit on your skin for 1-2 minutes or as long as told by your health care provider. 7. Thoroughly rinse your entire body in the shower. Make sure that all body creases and crevices are rinsed well. 8. Dry off with a clean towel. Do not put any substances on your body afterward--such as powder, lotion, or  perfume--unless you are told to do so by your health care provider. Only use lotions that are recommended by the manufacturer. 9. Put on clean clothes or pajamas. 10. If it is the night before your surgery, sleep in clean sheets.  During a sponge bath Follow these steps when using CHG solution during a sponge bath (unless your health care provider gives you different instructions): 1. Use your normal soap and shampoo to wash your face and hair. 2. Pour the CHG onto a clean washcloth. 3. Starting at your neck, lather your body down to your toes. Make sure you follow these instructions: ? If you will be having surgery, pay special attention to the part of your body where you will be having surgery. Scrub this area for at least 1 minute. ? Do not use CHG on your head or face. If the solution gets into your ears or eyes, rinse them well with water. ? Avoid your genital area. ? Avoid any areas of skin that have broken skin, cuts, or scrapes. ? Scrub your back and under your arms. Make sure to wash skin folds. 4. Let the lather sit on your skin for 1-2 minutes or as long as told by your health care provider. 5. Using a different clean, wet washcloth, thoroughly rinse your entire body. Make sure that all body creases and crevices are rinsed well. 6. Dry off with a clean towel. Do not put any substances on your body afterward--such as powder, lotion, or perfume--unless you are told to do so by your health care provider. Only use lotions that are recommended by the manufacturer. 7. Put on clean clothes or pajamas. 8. If it  is the night before your surgery, sleep in clean sheets. How to use CHG prepackaged cloths  Only use CHG cloths as told by your health care provider, and follow the instructions on the label.  Use the CHG cloth on clean, dry skin.  Do not use the CHG cloth on your head or face unless your health care provider tells you to.  When washing with the CHG cloth: ? Avoid your genital  area. ? Avoid any areas of skin that have broken skin, cuts, or scrapes. Before surgery Follow these steps when using a CHG cloth to clean before surgery (unless your health care provider gives you different instructions): 1. Using the CHG cloth, vigorously scrub the part of your body where you will be having surgery. Scrub using a back-and-forth motion for 3 minutes. The area on your body should be completely wet with CHG when you are done scrubbing. 2. Do not rinse. Discard the cloth and let the area air-dry. Do not put any substances on the area afterward, such as powder, lotion, or perfume. 3. Put on clean clothes or pajamas. 4. If it is the night before your surgery, sleep in clean sheets.  For general bathing Follow these steps when using CHG cloths for general bathing (unless your health care provider gives you different instructions). 1. Use a separate CHG cloth for each area of your body. Make sure you wash between any folds of skin and between your fingers and toes. Wash your body in the following order, switching to a new cloth after each step: ? The front of your neck, shoulders, and chest. ? Both of your arms, under your arms, and your hands. ? Your stomach and groin area, avoiding the genitals. ? Your right leg and foot. ? Your left leg and foot. ? The back of your neck, your back, and your buttocks. 2. Do not rinse. Discard the cloth and let the area air-dry. Do not put any substances on your body afterward--such as powder, lotion, or perfume--unless you are told to do so by your health care provider. Only use lotions that are recommended by the manufacturer. 3. Put on clean clothes or pajamas. Contact a health care provider if:  Your skin gets irritated after scrubbing.  You have questions about using your solution or cloth. Get help right away if:  Your eyes become very red or swollen.  Your eyes itch badly.  Your skin itches badly and is red or swollen.  Your  hearing changes.  You have trouble seeing.  You have swelling or tingling in your mouth or throat.  You have trouble breathing.  You swallow any chlorhexidine. Summary  Chlorhexidine gluconate (CHG) is a germ-killing (antiseptic) solution that is used to clean the skin. Cleaning your skin with CHG may help to lower your risk for infection.  You may be given CHG to use for bathing. It may be in a bottle or in a prepackaged cloth to use on your skin. Carefully follow your health care provider's instructions and the instructions on the product label.  Do not use CHG if you have a chlorhexidine allergy.  Contact your health care provider if your skin gets irritated after scrubbing. This information is not intended to replace advice given to you by your health care provider. Make sure you discuss any questions you have with your health care provider. Document Revised: 09/15/2018 Document Reviewed: 05/27/2017 Elsevier Patient Education  2020 [employer].

## 2019-09-26 ENCOUNTER — Encounter (HOSPITAL_COMMUNITY): Payer: Self-pay

## 2019-09-26 ENCOUNTER — Encounter (HOSPITAL_COMMUNITY)
Admission: RE | Admit: 2019-09-26 | Discharge: 2019-09-26 | Disposition: A | Discharge status: Home / Self Care | Payer: Medicaid Other | Source: Ambulatory Visit | Attending: Orthopedic Surgery | Admitting: Orthopedic Surgery

## 2019-09-26 ENCOUNTER — Other Ambulatory Visit (HOSPITAL_COMMUNITY): Payer: Medicaid Other

## 2019-09-26 ENCOUNTER — Other Ambulatory Visit: Payer: Self-pay

## 2019-09-26 ENCOUNTER — Telehealth: Payer: Self-pay | Admitting: Radiology

## 2019-09-26 ENCOUNTER — Other Ambulatory Visit (HOSPITAL_COMMUNITY)
Admission: RE | Admit: 2019-09-26 | Discharge: 2019-09-26 | Disposition: A | Discharge status: Home / Self Care | Payer: Medicaid Other | Source: Ambulatory Visit | Attending: Orthopedic Surgery | Admitting: Orthopedic Surgery

## 2019-09-26 NOTE — Telephone Encounter (Signed)
Left message for patient, she missed her preop appointment today, what is her plan for surgery, do we need to cancel?

## 2019-09-26 NOTE — Pre-Procedure Instructions (Signed)
Patient was a no show for her PAT today. Amy Littrell at Dr Harrsion's office notified by [employer].

## 2019-09-26 NOTE — Telephone Encounter (Signed)
-----   Message from Elsie Amis, RN sent at 09/26/2019 11:02 AM EDT ----- Regarding: no show Good morning Tina Ross! Tina Ross did not show for her PAT this morning and we have not been able to contact her. Her surgery is scheduled for Thursday. Thank you!

## 2019-09-26 NOTE — Telephone Encounter (Signed)
She no showed for the preop visit Surgery has been cancelled.  She has not returned calls from me or from [place]  To you FYI

## 2019-09-28 ENCOUNTER — Ambulatory Visit (HOSPITAL_COMMUNITY): Admission: RE | Admit: 2019-09-28 | Payer: Medicaid Other | Source: Home / Self Care | Admitting: Orthopedic Surgery

## 2019-09-28 ENCOUNTER — Encounter (HOSPITAL_COMMUNITY): Admission: RE | Payer: Self-pay | Source: Home / Self Care

## 2019-09-28 SURGERY — CARPAL TUNNEL RELEASE
Anesthesia: Choice | Laterality: Right

## 2019-11-01 ENCOUNTER — Telehealth: Payer: Self-pay

## 2019-11-01 NOTE — Telephone Encounter (Signed)
She has no showed x 2 for surgery  I will call her to [facility]

## 2019-11-01 NOTE — Telephone Encounter (Signed)
Patient would like to reschedule her surgery. Aware that we are having clinic this morning. Please call [phone]

## 2019-11-06 ENCOUNTER — Other Ambulatory Visit: Payer: Self-pay | Admitting: Orthopedic Surgery

## 2019-11-06 NOTE — Telephone Encounter (Signed)
May 18th for real this time  To you FYI

## 2019-11-22 NOTE — Patient Instructions (Signed)
Tina GauzeJennifer A Ross  11/22/2019     @PREFPERIOPPHARMACY @   Your procedure is scheduled on  11/28/2019 .  [facility] at  0815  A.M.  Call this number if you have problems the morning of surgery:  [phone]   Remember:  Do not eat or drink after midnight                    Take these medicines the morning of surgery with A SIP OF WATER  prozac.    Do not wear jewelry, make-up or nail polish.  Do not wear lotions, powders, or perfumes, or deodorant. Please brush your teeth.  Do not shave 48 hours prior to surgery.  Men may shave face and neck.  Do not bring valuables to the hospital.  [facility] is not responsible for any belongings or valuables.  Contacts, dentures or bridgework may not be worn into surgery.  Leave your suitcase in the car.  After surgery it may be brought to your room.  For patients admitted to the hospital, discharge time will be determined by your treatment team.  Patients discharged the day of surgery will not be allowed to drive home.   Name and phone number of your driver:   family Special instructions:  DO NOT smoke the morning of your procedure.  Please read over the following fact sheets that you were given. Anesthesia Post-op Instructions and Care and Recovery After Surgery       Open Carpal Tunnel Release, Care After This sheet gives you information about how to care for yourself after your procedure. Your health care provider may also give you more specific instructions. If you have problems or questions, contact your health care provider. What can I expect after the procedure? After the procedure, it is common to have:  Wrist stiffness.  Bruising. Follow these instructions at home: Bathing  Do not take baths, swim, or use a hot tub until your health care provider approves. Ask your health care provider if you may take showers.  Keep your bandage (dressing) dry until your health care provider says it can be  removed. If you have a splint or brace:  Wear the splint or brace as told by your health care provider. You may need to wear it for 2-3 weeks. Remove it only as told by your health care provider.  Loosen the splint or brace if your fingers tingle, become numb, or turn cold and blue.  Keep the splint or brace clean.  If the splint or brace is not waterproof: ? Do not let it get wet. ? Cover it with a watertight covering when you take a bath or a shower. Incision care   Follow instructions from your health care provider about how to take care of your incision. Make sure you: ? Wash your hands with soap and water before you change your dressing. If soap and water are not available, use hand sanitizer. ? Change your dressing as told by your health care provider. ? Leave stitches (sutures), skin glue, or adhesive strips in place. These skin closures may need to stay in place for 2 weeks or longer. If adhesive strip edges start to loosen and curl up, you may trim the loose edges. Do not remove adhesive strips completely unless your health care provider tells you to do that.  Check your incision area every day for signs of infection. Check for: ? Redness, swelling, or  pain. ? Fluid or blood. ? Warmth. ? Pus or a bad smell. Managing pain, stiffness, and swelling   If directed, put ice on the affected area. ? If you have a removable splint or brace, remove it as told by your health care provider. ? Put ice in a plastic bag. ? Place a towel between your skin and the bag. ? Leave the ice on for 20 minutes, 2-3 times a day.  Move your fingers often to avoid stiffness and to lessen swelling.  Raise (elevate) your wrist above the level of your heart while you are sitting or lying down. Activity  Do not drive until your health care provider approves.  Do not drive or use heavy machinery while taking prescription pain medicine.  Return to your normal activities as told by your health  care provider. Avoid activities that cause pain.  If physical therapy was prescribed, do exercises as told by your therapist. Physical therapy can help you heal faster and regain movement. General instructions  Take over-the-counter and prescription medicines only as told by your health care provider.  If you are taking prescription pain medicine, take actions to prevent or treat constipation. Your health care provider may recommend that you: ? Drink enough fluid to keep your urine pale yellow. ? Eat foods that are high in fiber, such as fresh fruits and vegetables, whole grains, and beans. ? Limit foods that are high in fat and processed sugars, such as fried or sweet foods. ? Take an over-the-counter or prescription medicine for constipation.  Do not use any products that contain nicotine or tobacco, such as cigarettes and e-cigarettes. If you need help quitting, ask your health care provider.  Keep all follow-up visits as told by your health care provider and physical therapist. This is important. Contact a health care provider if:  You have redness or swelling around your incision.  You have fluid or blood coming from your incision.  Your incision feels warm to the touch.  You have pus or a bad smell coming from your incision.  You have a fever.  You have chills.  You have pain that does not get better with medicine.  Your carpal tunnel symptoms do not go away after 2 months.  Your carpal tunnel symptoms go away and then come back. Get help right away if:  You have pain or numbness that is getting worse.  Your fingers or fingertips become very pale or bluish in color.  You are not able to move your fingers. Summary  It is common to have wrist stiffness and bruising after a carpal tunnel release.  Icing and raising (elevating) your wrist may help to lessen swelling and pain.  Call your health care provider if you have a fever or notice any signs of infection in your  incision area. This information is not intended to replace advice given to you by your health care provider. Make sure you discuss any questions you have with your health care provider. Document Revised: 06/11/2017 Document Reviewed: 03/08/2017 Elsevier Patient Education  2020 [place] After These instructions provide you with information about caring for yourself after your procedure. Your health care provider may also give you more specific instructions. Your treatment has been planned according to current medical practices, but problems sometimes occur. Call your health care provider if you have any problems or questions after your procedure. What can I expect after the procedure? After your procedure, you may:  Feel sleepy  for several hours.  Feel clumsy and have poor balance for several hours.  Feel forgetful about what happened after the procedure.  Have poor judgment for several hours.  Feel nauseous or vomit.  Have a sore throat if you had a breathing tube during the procedure. Follow these instructions at home: For at least 24 hours after the procedure:      Have a responsible adult stay with you. It is important to have someone help care for you until you are awake and alert.  Rest as needed.  Do not: ? Participate in activities in which you could fall or become injured. ? Drive. ? Use heavy machinery. ? Drink alcohol. ? Take sleeping pills or medicines that cause drowsiness. ? Make important decisions or sign legal documents. ? Take care of children on your own. Eating and drinking  Follow the diet that is recommended by your health care provider.  If you vomit, drink water, juice, or soup when you can drink without vomiting.  Make sure you have little or no nausea before eating solid foods. General instructions  Take over-the-counter and prescription medicines only as told by your health care provider.  If you have  sleep apnea, surgery and certain medicines can increase your risk for breathing problems. Follow instructions from your health care provider about wearing your sleep device: ? Anytime you are sleeping, including during daytime naps. ? While taking prescription pain medicines, sleeping medicines, or medicines that make you drowsy.  If you smoke, do not smoke without supervision.  Keep all follow-up visits as told by your health care provider. This is important. Contact a health care provider if:  You keep feeling nauseous or you keep vomiting.  You feel light-headed.  You develop a rash.  You have a fever. Get help right away if:  You have trouble breathing. Summary  For several hours after your procedure, you may feel sleepy and have poor judgment.  Have a responsible adult stay with you for at least 24 hours or until you are awake and alert. This information is not intended to replace advice given to you by your health care provider. Make sure you discuss any questions you have with your health care provider. Document Revised: 09/27/2017 Document Reviewed: 10/20/2015 Elsevier Patient Education  2020 [employer]. How to Use Chlorhexidine for Bathing Chlorhexidine gluconate (CHG) is a germ-killing (antiseptic) solution that is used to clean the skin. It can get rid of the bacteria that normally live on the skin and can keep them away for about 24 hours. To clean your skin with CHG, you may be given:  A CHG solution to use in the shower or as part of a sponge bath.  A prepackaged cloth that contains CHG. Cleaning your skin with CHG may help lower the risk for infection:  While you are staying in the intensive care unit of the hospital.  If you have a vascular access, such as a central line, to provide short-term or long-term access to your veins.  If you have a catheter to drain urine from your bladder.  If you are on a ventilator. A ventilator is a machine that helps you  breathe by moving air in and out of your lungs.  After surgery. What are the risks? Risks of using CHG include:  A skin reaction.  Hearing loss, if CHG gets in your ears.  Eye injury, if CHG gets in your eyes and is not rinsed out.  The CHG product catching fire.  Make sure that you avoid smoking and flames after applying CHG to your skin. Do not use CHG:  If you have a chlorhexidine allergy or have previously reacted to chlorhexidine.  On babies younger than 71 months of age. How to use CHG solution  Use CHG only as told by your health care provider, and follow the instructions on the label.  Use the full amount of CHG as directed. Usually, this is one bottle. During a shower Follow these steps when using CHG solution during a shower (unless your health care provider gives you different instructions): 1. Start the shower. 2. Use your normal soap and shampoo to wash your face and hair. 3. Turn off the shower or move out of the shower stream. 4. Pour the CHG onto a clean washcloth. Do not use any type of brush or rough-edged sponge. 5. Starting at your neck, lather your body down to your toes. Make sure you follow these instructions: ? If you will be having surgery, pay special attention to the part of your body where you will be having surgery. Scrub this area for at least 1 minute. ? Do not use CHG on your head or face. If the solution gets into your ears or eyes, rinse them well with water. ? Avoid your genital area. ? Avoid any areas of skin that have broken skin, cuts, or scrapes. ? Scrub your back and under your arms. Make sure to wash skin folds. 6. Let the lather sit on your skin for 1-2 minutes or as long as told by your health care provider. 7. Thoroughly rinse your entire body in the shower. Make sure that all body creases and crevices are rinsed well. 8. Dry off with a clean towel. Do not put any substances on your body afterward--such as powder, lotion, or  perfume--unless you are told to do so by your health care provider. Only use lotions that are recommended by the manufacturer. 9. Put on clean clothes or pajamas. 10. If it is the night before your surgery, sleep in clean sheets.  During a sponge bath Follow these steps when using CHG solution during a sponge bath (unless your health care provider gives you different instructions): 1. Use your normal soap and shampoo to wash your face and hair. 2. Pour the CHG onto a clean washcloth. 3. Starting at your neck, lather your body down to your toes. Make sure you follow these instructions: ? If you will be having surgery, pay special attention to the part of your body where you will be having surgery. Scrub this area for at least 1 minute. ? Do not use CHG on your head or face. If the solution gets into your ears or eyes, rinse them well with water. ? Avoid your genital area. ? Avoid any areas of skin that have broken skin, cuts, or scrapes. ? Scrub your back and under your arms. Make sure to wash skin folds. 4. Let the lather sit on your skin for 1-2 minutes or as long as told by your health care provider. 5. Using a different clean, wet washcloth, thoroughly rinse your entire body. Make sure that all body creases and crevices are rinsed well. 6. Dry off with a clean towel. Do not put any substances on your body afterward--such as powder, lotion, or perfume--unless you are told to do so by your health care provider. Only use lotions that are recommended by the manufacturer. 7. Put on clean clothes or pajamas. 8. If it is the  night before your surgery, sleep in clean sheets. How to use CHG prepackaged cloths  Only use CHG cloths as told by your health care provider, and follow the instructions on the label.  Use the CHG cloth on clean, dry skin.  Do not use the CHG cloth on your head or face unless your health care provider tells you to.  When washing with the CHG cloth: ? Avoid your genital  area. ? Avoid any areas of skin that have broken skin, cuts, or scrapes. Before surgery Follow these steps when using a CHG cloth to clean before surgery (unless your health care provider gives you different instructions): 1. Using the CHG cloth, vigorously scrub the part of your body where you will be having surgery. Scrub using a back-and-forth motion for 3 minutes. The area on your body should be completely wet with CHG when you are done scrubbing. 2. Do not rinse. Discard the cloth and let the area air-dry. Do not put any substances on the area afterward, such as powder, lotion, or perfume. 3. Put on clean clothes or pajamas. 4. If it is the night before your surgery, sleep in clean sheets.  For general bathing Follow these steps when using CHG cloths for general bathing (unless your health care provider gives you different instructions). 1. Use a separate CHG cloth for each area of your body. Make sure you wash between any folds of skin and between your fingers and toes. Wash your body in the following order, switching to a new cloth after each step: ? The front of your neck, shoulders, and chest. ? Both of your arms, under your arms, and your hands. ? Your stomach and groin area, avoiding the genitals. ? Your right leg and foot. ? Your left leg and foot. ? The back of your neck, your back, and your buttocks. 2. Do not rinse. Discard the cloth and let the area air-dry. Do not put any substances on your body afterward--such as powder, lotion, or perfume--unless you are told to do so by your health care provider. Only use lotions that are recommended by the manufacturer. 3. Put on clean clothes or pajamas. Contact a health care provider if:  Your skin gets irritated after scrubbing.  You have questions about using your solution or cloth. Get help right away if:  Your eyes become very red or swollen.  Your eyes itch badly.  Your skin itches badly and is red or swollen.  Your  hearing changes.  You have trouble seeing.  You have swelling or tingling in your mouth or throat.  You have trouble breathing.  You swallow any chlorhexidine. Summary  Chlorhexidine gluconate (CHG) is a germ-killing (antiseptic) solution that is used to clean the skin. Cleaning your skin with CHG may help to lower your risk for infection.  You may be given CHG to use for bathing. It may be in a bottle or in a prepackaged cloth to use on your skin. Carefully follow your health care provider's instructions and the instructions on the product label.  Do not use CHG if you have a chlorhexidine allergy.  Contact your health care provider if your skin gets irritated after scrubbing. This information is not intended to replace advice given to you by your health care provider. Make sure you discuss any questions you have with your health care provider. Document Revised: 09/15/2018 Document Reviewed: 05/27/2017 Elsevier Patient Education  2020 [employer].

## 2019-11-27 ENCOUNTER — Other Ambulatory Visit (HOSPITAL_COMMUNITY)
Admission: RE | Admit: 2019-11-27 | Discharge: 2019-11-27 | Disposition: A | Discharge status: Home / Self Care | Payer: Medicaid Other | Source: Ambulatory Visit | Attending: Orthopedic Surgery | Admitting: Orthopedic Surgery

## 2019-11-27 ENCOUNTER — Encounter (HOSPITAL_COMMUNITY): Payer: Self-pay

## 2019-11-27 ENCOUNTER — Encounter (HOSPITAL_COMMUNITY)
Admission: RE | Admit: 2019-11-27 | Discharge: 2019-11-27 | Disposition: A | Discharge status: Home / Self Care | Payer: Medicaid Other | Source: Ambulatory Visit | Attending: Orthopedic Surgery | Admitting: Orthopedic Surgery

## 2019-11-27 ENCOUNTER — Other Ambulatory Visit: Payer: Self-pay

## 2019-11-27 DIAGNOSIS — Z20822 Contact with and (suspected) exposure to covid-19: Secondary | ICD-10-CM | POA: Diagnosis not present

## 2019-11-27 DIAGNOSIS — Z01812 Encounter for preprocedural laboratory examination: Secondary | ICD-10-CM | POA: Diagnosis not present

## 2019-11-27 LAB — BASIC METABOLIC PANEL
Anion gap: 9 (ref 5–15)
BUN: 13 mg/dL (ref 6–20)
CO2: 27 mmol/L (ref 22–32)
Calcium: 8.9 mg/dL (ref 8.9–10.3)
Chloride: 104 mmol/L (ref 98–111)
Creatinine, Ser: 0.49 mg/dL (ref 0.44–1.00)
GFR calc Af Amer: >60 mL/min (ref >60)
GFR calc non Af Amer: >60 mL/min (ref >60)
Glucose, Bld: 101 mg/dL — ABNORMAL HIGH (ref 70–99)
Potassium: 4.1 mmol/L (ref 3.5–5.1)
Sodium: 140 mmol/L (ref 135–145)

## 2019-11-27 LAB — RAPID URINE DRUG SCREEN, HOSP PERFORMED
Amphetamines: NOT DETECTED
Barbiturates: NOT DETECTED
Benzodiazepines: NOT DETECTED
Cocaine: NOT DETECTED
Opiates: NOT DETECTED
Tetrahydrocannabinol: POSITIVE — AB

## 2019-11-27 LAB — CBC WITH DIFFERENTIAL/PLATELET
Abs Immature Granulocytes: 0.03 10*3/uL (ref 0.00–0.07)
Basophils Absolute: 0.1 10*3/uL (ref 0.0–0.1)
Basophils Relative: 1 %
Eosinophils Absolute: 0.2 10*3/uL (ref 0.0–0.5)
Eosinophils Relative: 2 %
HCT: 41.6 % (ref 36.0–46.0)
Hemoglobin: 12.8 g/dL (ref 12.0–15.0)
Immature Granulocytes: 0 %
Lymphocytes Relative: 24 %
Lymphs Abs: 2 10*3/uL (ref 0.7–4.0)
MCH: 30.7 pg (ref 26.0–34.0)
MCHC: 30.8 g/dL (ref 30.0–36.0)
MCV: 99.8 fL (ref 80.0–100.0)
Monocytes Absolute: 0.6 10*3/uL (ref 0.1–1.0)
Monocytes Relative: 7 %
Neutro Abs: 5.5 10*3/uL (ref 1.7–7.7)
Neutrophils Relative %: 66 %
Platelets: 199 10*3/uL (ref 150–400)
RBC: 4.17 MIL/uL (ref 3.87–5.11)
RDW: 14.1 % (ref 11.5–15.5)
WBC: 8.3 10*3/uL (ref 4.0–10.5)
nRBC: 0 % (ref 0.0–0.2)

## 2019-11-27 LAB — SARS CORONAVIRUS 2 (TAT 6-24 HRS): SARS Coronavirus 2: NEGATIVE

## 2019-11-27 LAB — HCG, SERUM, QUALITATIVE: Preg, Serum: NEGATIVE

## 2019-11-27 NOTE — H&P (Addendum)
Chief complaint pain paresthesias with numbness and tingling median nerve distribution right hand.  Patient status post left carpal tunnel release many years ago by Dr. Derinda Late  Patient says that she wants to go right ahead to surgery as the weakness and loss of function right upper extremity is causing her a lot of problems with work and her activities of daily living  Her review of systems is as follows  Review of Systems  Constitutional: Negative for fever and malaise/fatigue.  HENT: Negative for hearing loss.   Respiratory: Negative for shortness of breath.   Cardiovascular: Negative for chest pain.  Neurological: Positive for tingling, sensory change and weakness.       Past Medical History:  Diagnosis Date  . Abnormal Pap smear    HPV  . Anemia   . Anxiety   . Arthritis    knees  . Asthma    rarely uses inhaler- last times used was last week  . Bipolar 1 disorder ([place])   . Bipolar 1 disorder, depressed, moderate ([place]) 10/04/2012  . Bipolar disorder ([place])   . Carpal tunnel syndrome   . Depression   . Headache(784.0)   . Heart murmur    irregular heartbeat as child-cleared by cardiologist  . Hidradenitis 10/04/2012  . Insomnia   . Nausea and vomiting in pregnancy 10/04/2012  . Post partum depression   . Sleep apnea    Does not use CPAP        Past Surgical History:  Procedure Laterality Date  . BRAIN SURGERY  2009  . CARPAL TUNNEL RELEASE Left 12/28/2013   Procedure: CARPAL TUNNEL RELEASE;  Surgeon: Sanjuana Kava, MD;  Location: [facility];  Service: Orthopedics;  Laterality: Left;  . CHOLECYSTECTOMY           Family History  Problem Relation Age of Onset  . Hypertension Mother   . Heart disease Mother   . Mental illness Mother   . Thyroid disease Mother   . Dementia Mother   . Kidney disease Mother   . Cancer Mother        liver  . Hypertension Father   . Diabetes Father   . Heart disease Father   . Alcohol abuse  Father   . Cancer Daughter        Bone  . Heart disease Daughter   . Hypertension Maternal Grandmother   . Hypertension Maternal Grandfather   . Diabetes Maternal Grandfather   . Cancer Maternal Grandfather        Prostate, colon, bladder, lung  . Diabetes Paternal Grandmother   . Stroke Paternal Grandmother   . Diabetes Paternal Grandfather   . Cancer Paternal Grandfather        Bone and lung  . Diabetes Paternal Aunt   . Diabetes Paternal Aunt    Social History        Tobacco Use  . Smoking status: Never Smoker  . Smokeless tobacco: Never Used  Substance Use Topics  . Alcohol use: Not Currently  . Drug use: Not Currently    Types: Cocaine, "Crack" cocaine    Comment: last used last week    Physical Exam  39 year old female medium frame oriented x3 mood and affect are pleasant and anxious  Gait unaffected  Left upper extremity shows a previous carpal tunnel release she also recently injured her thumb range of motion has returned she has decent strength skin is clean  Right side she has pressure pain over the carpal tunnel decreased sensation  in the median nerve distribution lymph nodes negative skin is intact except for an abrasion on the long finger grip strength is poor stability the wrist is normal range of motion is intact      Encounter Diagnosis  Name Primary?  . Carpal tunnel syndrome of right wrist Yes   Carpal tunnel release right 11:08 AM  11/27/2019

## 2019-11-28 ENCOUNTER — Ambulatory Visit (HOSPITAL_COMMUNITY): Payer: Medicaid Other | Admitting: Certified Registered"

## 2019-11-28 ENCOUNTER — Ambulatory Visit (HOSPITAL_COMMUNITY)
Admission: RE | Admit: 2019-11-28 | Discharge: 2019-11-28 | Disposition: A | Discharge status: Home / Self Care | Payer: Medicaid Other | Attending: Orthopedic Surgery | Admitting: Orthopedic Surgery

## 2019-11-28 ENCOUNTER — Encounter (HOSPITAL_COMMUNITY): Payer: Self-pay | Admitting: Orthopedic Surgery

## 2019-11-28 ENCOUNTER — Encounter (HOSPITAL_COMMUNITY)
Admission: RE | Disposition: A | Discharge status: Home / Self Care | Payer: Self-pay | Source: Home / Self Care | Attending: Orthopedic Surgery

## 2019-11-28 DIAGNOSIS — F319 Bipolar disorder, unspecified: Secondary | ICD-10-CM | POA: Insufficient documentation

## 2019-11-28 DIAGNOSIS — G4730 Sleep apnea, unspecified: Secondary | ICD-10-CM | POA: Diagnosis not present

## 2019-11-28 DIAGNOSIS — Z8249 Family history of ischemic heart disease and other diseases of the circulatory system: Secondary | ICD-10-CM | POA: Insufficient documentation

## 2019-11-28 DIAGNOSIS — Z8349 Family history of other endocrine, nutritional and metabolic diseases: Secondary | ICD-10-CM | POA: Diagnosis not present

## 2019-11-28 DIAGNOSIS — J45909 Unspecified asthma, uncomplicated: Secondary | ICD-10-CM | POA: Insufficient documentation

## 2019-11-28 DIAGNOSIS — M1990 Unspecified osteoarthritis, unspecified site: Secondary | ICD-10-CM | POA: Insufficient documentation

## 2019-11-28 DIAGNOSIS — G5601 Carpal tunnel syndrome, right upper limb: Secondary | ICD-10-CM | POA: Diagnosis not present

## 2019-11-28 HISTORY — PX: CARPAL TUNNEL RELEASE: SHX101

## 2019-11-28 SURGERY — CARPAL TUNNEL RELEASE
Anesthesia: General | Laterality: Right

## 2019-11-28 MED ORDER — HYDROMORPHONE HCL 1 MG/ML IJ SOLN
0.2500 mg | INTRAMUSCULAR | Status: DC | PRN
  Administered 2019-11-28 (×2): 0.5 mg via INTRAVENOUS
  Filled 2019-11-28 (×2): qty 0.5

## 2019-11-28 MED ORDER — DEXAMETHASONE SODIUM PHOSPHATE 10 MG/ML IJ SOLN
INTRAMUSCULAR | Status: DC | PRN
  Administered 2019-11-28: 10 mg via INTRAVENOUS

## 2019-11-28 MED ORDER — ONDANSETRON HCL 4 MG/2ML IJ SOLN: 4.0000 mg | Freq: Once | INTRAMUSCULAR | Status: DC | PRN

## 2019-11-28 MED ORDER — ONDANSETRON HCL 4 MG/2ML IJ SOLN
INTRAMUSCULAR | Status: AC
  Filled 2019-11-28: qty 2

## 2019-11-28 MED ORDER — MIDAZOLAM HCL 5 MG/5ML IJ SOLN
INTRAMUSCULAR | Status: DC | PRN
  Administered 2019-11-28: 2 mg via INTRAVENOUS

## 2019-11-28 MED ORDER — HYDROCODONE-ACETAMINOPHEN 10-325 MG PO TABS
1.0000 | ORAL_TABLET | Freq: Once | ORAL | Status: DC
  Filled 2019-11-28: qty 1

## 2019-11-28 MED ORDER — LACTATED RINGERS IV SOLN: INTRAVENOUS | Status: DC

## 2019-11-28 MED ORDER — LIDOCAINE HCL (PF) 1 % IJ SOLN
INTRAMUSCULAR | Status: DC | PRN
  Administered 2019-11-28: 10 mL

## 2019-11-28 MED ORDER — LIDOCAINE HCL (PF) 1 % IJ SOLN
INTRAMUSCULAR | Status: AC
  Filled 2019-11-28: qty 30

## 2019-11-28 MED ORDER — HYDROCODONE-ACETAMINOPHEN 7.5-325 MG PO TABS: 1.0000 | ORAL_TABLET | ORAL | 0 refills | Status: DC | PRN

## 2019-11-28 MED ORDER — FENTANYL CITRATE (PF) 100 MCG/2ML IJ SOLN
INTRAMUSCULAR | Status: AC
  Filled 2019-11-28: qty 2

## 2019-11-28 MED ORDER — CEFAZOLIN SODIUM-DEXTROSE 2-4 GM/100ML-% IV SOLN
2.0000 g | INTRAVENOUS | Status: AC
  Administered 2019-11-28: 2 g via INTRAVENOUS
  Filled 2019-11-28: qty 100

## 2019-11-28 MED ORDER — HYDROCODONE-ACETAMINOPHEN 7.5-325 MG PO TABS: 1.0000 | ORAL_TABLET | ORAL | 0 refills | Status: AC | PRN

## 2019-11-28 MED ORDER — 0.9 % SODIUM CHLORIDE (POUR BTL) OPTIME
TOPICAL | Status: DC | PRN
  Administered 2019-11-28: 1000 mL

## 2019-11-28 MED ORDER — FENTANYL CITRATE (PF) 100 MCG/2ML IJ SOLN
INTRAMUSCULAR | Status: DC | PRN
  Administered 2019-11-28 (×2): 50 ug via INTRAVENOUS

## 2019-11-28 MED ORDER — HYDROCODONE-ACETAMINOPHEN 5-325 MG PO TABS: 2.0000 | ORAL_TABLET | Freq: Once | ORAL | Status: DC

## 2019-11-28 MED ORDER — DEXAMETHASONE SODIUM PHOSPHATE 10 MG/ML IJ SOLN
INTRAMUSCULAR | Status: AC
  Filled 2019-11-28: qty 1

## 2019-11-28 MED ORDER — PROPOFOL 500 MG/50ML IV EMUL
INTRAVENOUS | Status: DC | PRN
  Administered 2019-11-28: 150 ug/kg/min via INTRAVENOUS

## 2019-11-28 MED ORDER — BUPIVACAINE HCL (PF) 0.5 % IJ SOLN
INTRAMUSCULAR | Status: AC
  Filled 2019-11-28: qty 30

## 2019-11-28 MED ORDER — ONDANSETRON HCL 4 MG/2ML IJ SOLN
INTRAMUSCULAR | Status: DC | PRN
  Administered 2019-11-28: 4 mg via INTRAVENOUS

## 2019-11-28 MED ORDER — MIDAZOLAM HCL 2 MG/2ML IJ SOLN
INTRAMUSCULAR | Status: AC
  Filled 2019-11-28: qty 2

## 2019-11-28 MED ORDER — PROPOFOL 10 MG/ML IV BOLUS
INTRAVENOUS | Status: AC
  Filled 2019-11-28: qty 60

## 2019-11-28 SURGICAL SUPPLY — 43 items
APL PRP STRL LF DISP 70% ISPRP (MISCELLANEOUS) ×1
BANDAGE ESMARK 4X12 BL STRL LF (DISPOSABLE) ×1 IMPLANT
BLADE SURG 15 STRL LF DISP TIS (BLADE) ×1 IMPLANT
BLADE SURG 15 STRL SS (BLADE) ×3
BNDG CMPR 12X4 ELC STRL LF (DISPOSABLE) ×1
BNDG CMPR STD VLCR NS LF 5.8X3 (GAUZE/BANDAGES/DRESSINGS) ×1
BNDG COHESIVE 4X5 TAN STRL (GAUZE/BANDAGES/DRESSINGS) ×3 IMPLANT
BNDG ELASTIC 3X5.8 VLCR NS LF (GAUZE/BANDAGES/DRESSINGS) ×3 IMPLANT
BNDG ESMARK 4X12 BLUE STRL LF (DISPOSABLE) ×3
BNDG GAUZE ELAST 4 BULKY (GAUZE/BANDAGES/DRESSINGS) ×3 IMPLANT
CHLORAPREP W/TINT 26 (MISCELLANEOUS) ×3 IMPLANT
CLOTH BEACON ORANGE TIMEOUT ST (SAFETY) ×3 IMPLANT
COVER LIGHT HANDLE STERIS (MISCELLANEOUS) ×6 IMPLANT
COVER WAND RF STERILE (DRAPES) ×3 IMPLANT
CUFF TOURN SGL QUICK 18X4 (TOURNIQUET CUFF) ×3 IMPLANT
DECANTER SPIKE VIAL GLASS SM (MISCELLANEOUS) ×5 IMPLANT
DRAPE HALF SHEET 40X57 (DRAPES) ×3 IMPLANT
DRSG XEROFORM 1X8 (GAUZE/BANDAGES/DRESSINGS) ×3 IMPLANT
ELECT NDL TIP 2.8 STRL (NEEDLE) ×1 IMPLANT
ELECT NEEDLE TIP 2.8 STRL (NEEDLE) ×3 IMPLANT
ELECT REM PT RETURN 9FT ADLT (ELECTROSURGICAL) ×3
ELECTRODE REM PT RTRN 9FT ADLT (ELECTROSURGICAL) ×1 IMPLANT
GAUZE SPONGE 4X4 12PLY STRL (GAUZE/BANDAGES/DRESSINGS) ×3 IMPLANT
GLOVE BIOGEL PI IND STRL 7.0 (GLOVE) ×2 IMPLANT
GLOVE BIOGEL PI INDICATOR 7.0 (GLOVE) ×4
GLOVE ECLIPSE 6.5 STRL STRAW (GLOVE) ×2 IMPLANT
GLOVE SKINSENSE NS SZ8.0 LF (GLOVE) ×2
GLOVE SKINSENSE STRL SZ8.0 LF (GLOVE) ×1 IMPLANT
GLOVE SS N UNI LF 8.5 STRL (GLOVE) ×3 IMPLANT
GOWN STRL REUS W/TWL LRG LVL3 (GOWN DISPOSABLE) ×3 IMPLANT
GOWN STRL REUS W/TWL XL LVL3 (GOWN DISPOSABLE) ×3 IMPLANT
KIT TURNOVER KIT A (KITS) ×3 IMPLANT
MANIFOLD NEPTUNE II (INSTRUMENTS) ×3 IMPLANT
NDL HYPO 21X1.5 SAFETY (NEEDLE) ×1 IMPLANT
NEEDLE HYPO 21X1.5 SAFETY (NEEDLE) ×3 IMPLANT
NEEDLE HYPO 22GX1.5 SAFETY (NEEDLE) ×2 IMPLANT
NS IRRIG 1000ML POUR BTL (IV SOLUTION) ×3 IMPLANT
PACK BASIC LIMB (CUSTOM PROCEDURE TRAY) ×3 IMPLANT
PAD ARMBOARD 7.5X6 YLW CONV (MISCELLANEOUS) ×3 IMPLANT
POSITIONER HAND ALUMI XLG (MISCELLANEOUS) ×3 IMPLANT
SET BASIN LINEN APH (SET/KITS/TRAYS/PACK) ×3 IMPLANT
SUT ETHILON 3 0 FSL (SUTURE) ×3 IMPLANT
SYR CONTROL 10ML LL (SYRINGE) ×5 IMPLANT

## 2019-11-28 NOTE — Op Note (Signed)
11/28/2019  10:37 AM  PATIENT:  Tina Ross  39 y.o. female  PRE-OPERATIVE DIAGNOSIS:  right carpal tunnel syndrome  POST-OPERATIVE DIAGNOSIS:  right carpal tunnel syndrome  PROCEDURE:  Procedure(s): RIGHT CARPAL TUNNEL RELEASE (Right)   [id number]  SURGEON:  Surgeon(s) and Role:    Vickki Hearing, MD - Primary  PHYSICIAN ASSISTANT:   ASSISTANTS: none   ANESTHESIA:   IV sedation with propofol and local 1% lidocaine plain 10 cc at the incision  EBL:  None    BLOOD ADMINISTERED:none  DRAINS: none   LOCAL MEDICATIONS USED:  OTHER as above  SPECIMEN:  No Specimen  DISPOSITION OF SPECIMEN:  N/A  COUNTS:  YES  TOURNIQUET:   Total Tourniquet Time Documented: Upper Arm (Right) - 12 minutes Total: Upper Arm (Right) - 12 minutes   DICTATION: .Reubin Milan Dictation  PLAN OF CARE: Discharge to home after PACU  PATIENT DISPOSITION:  PACU - hemodynamically stable.   Delay start of Pharmacological VTE agent (>24hrs) due to surgical blood loss or risk of bleeding: not applicable   PROCEDURE:  Procedure(s): RIGHT CARPAL TUNNEL RELEASE RIGHT   DICTATION: .Dragon Dictation   Surgeon Romeo Apple  Operative findings compression of the RIGHT MEDIAN NERVE, color normal shape normal  Indications failure of conservative treatment to relieve pain and paresthesias and numbness and tingling of the RIGHT HAND   The patient was identified in the preop area we confirm the surgical site marked as right wrist. Chart update completed. Patient taken to surgery.  Antibiotic 2 g Ancef after establishing a successful IV sedation the right arm was prepped with ChloraPrep  Timeout executed completed and confirmed site. RIGHT WRIST  /  HAND   10 cc of 1% plain Marcaine was injected into the proposed incision site. After several minutes A straight incision was made over the RIGHT carpal tunnel in line with the radial border of the ring finger. Blunt dissection was carried out to find the  distal aspect of the carpal tunnel. A blunted judgment was passed beneath the carpal tunnel. Sharp incision was then used to release the transverse carpal ligament. The contents of the carpal tunnel were inspected. The median nerve was noted to have good color and normal shape the carpal tunnel itself seem to be tight  The wound was irrigated and then closed with 3-0 nylon suture. (Proximal 2 interrupted sutures and then 1 running nylon suture.)  A sterile bandage was applied and the tourniquet was released the color of the hand and capillary refill were normal  The patient was taken to the recovery room in stable condition  PLAN OF CARE: Discharge to home after PACU  PATIENT DISPOSITION:  PACU - hemodynamically stable.   Delay start of Pharmacological VTE agent (>24hrs) due to surgical blood loss or risk of bleeding: not applicable

## 2019-11-28 NOTE — Interval H&P Note (Signed)
History and Physical Interval Note:  11/28/2019 9:51 AM  Tina Ross  has presented today for surgery, with the diagnosis of right carpal tunnel syndrome.  The various methods of treatment have been discussed with the patient and family. After consideration of risks, benefits and other options for treatment, the patient has consented to  Procedure(s): RIGHT CARPAL TUNNEL RELEASE (Right) as a surgical intervention.  The patient's history has been reviewed, patient examined, no change in status, stable for surgery.  I have reviewed the patient's chart and labs.  Questions were answered to the patient's satisfaction.     Fuller Canada

## 2019-11-28 NOTE — Progress Notes (Signed)
RN reviewed demonstration and teach back of using incentive spirometer with patient

## 2019-11-28 NOTE — Anesthesia Preprocedure Evaluation (Signed)
Anesthesia Evaluation  Patient identified by MRN, date of birth, ID band Patient awake    Reviewed: Allergy & Precautions, H&P , NPO status , Patient's Chart, lab work & pertinent test results, reviewed documented beta blocker date and time   Airway Mallampati: III  TM Distance: >3 FB Neck ROM: full    Dental no notable dental hx. (+) Teeth Intact   Pulmonary asthma , sleep apnea ,    Pulmonary exam normal breath sounds clear to auscultation       Cardiovascular Exercise Tolerance: Good  Rhythm:regular Rate:Normal     Neuro/Psych  Headaches,  Neuromuscular disease negative psych ROS   GI/Hepatic negative GI ROS, Neg liver ROS,   Endo/Other  negative endocrine ROS  Renal/GU negative Renal ROS  negative genitourinary   Musculoskeletal   Abdominal   Peds  Hematology  (+) Blood dyscrasia, anemia ,   Anesthesia Other Findings   Reproductive/Obstetrics negative OB ROS                             Anesthesia Physical Anesthesia Plan  ASA: III  Anesthesia Plan: General   Post-op Pain Management:    Induction:   PONV Risk Score and Plan: Propofol infusion  Airway Management Planned:   Additional Equipment:   Intra-op Plan:   Post-operative Plan:   Informed Consent: I have reviewed the patients History and Physical, chart, labs and discussed the procedure including the risks, benefits and alternatives for the proposed anesthesia with the patient or authorized representative who has indicated his/her understanding and acceptance.     Dental Advisory Given  Plan Discussed with: CRNA  Anesthesia Plan Comments:         Anesthesia Quick Evaluation

## 2019-11-28 NOTE — Brief Op Note (Signed)
11/28/2019  10:28 AM  PATIENT:  Tina Ross  39 y.o. female  PRE-OPERATIVE DIAGNOSIS:  right carpal tunnel syndrome  POST-OPERATIVE DIAGNOSIS:  right carpal tunnel syndrome  PROCEDURE:  Procedure(s): RIGHT CARPAL TUNNEL RELEASE (Right)   64721  SURGEON:  Surgeon(s) and Role:    Vickki Hearing, MD - Primary  PHYSICIAN ASSISTANT:   ASSISTANTS: none   ANESTHESIA:   IV sedation with propofol and local 1% lidocaine plain 10 cc at the incision  EBL:  None    BLOOD ADMINISTERED:none  DRAINS: none   LOCAL MEDICATIONS USED:  OTHER as above  SPECIMEN:  No Specimen  DISPOSITION OF SPECIMEN:  N/A  COUNTS:  YES  TOURNIQUET:   Total Tourniquet Time Documented: Upper Arm (Right) - 12 minutes Total: Upper Arm (Right) - 12 minutes   DICTATION: .Reubin Milan Dictation  PLAN OF CARE: Discharge to home after PACU  PATIENT DISPOSITION:  PACU - hemodynamically stable.   Delay start of Pharmacological VTE agent (>24hrs) due to surgical blood loss or risk of bleeding: not applicable   PROCEDURE:  Procedure(s): RIGHT CARPAL TUNNEL RELEASE RIGHT   DICTATION: .Dragon Dictation   Surgeon Romeo Apple  Operative findings compression of the RIGHT MEDIAN NERVE, color normal shape normal  Indications failure of conservative treatment to relieve pain and paresthesias and numbness and tingling of the RIGHT HAND   The patient was identified in the preop area we confirm the surgical site marked as right wrist. Chart update completed. Patient taken to surgery.  Antibiotic 2 g Ancef after establishing a successful IV sedation the right arm was prepped with ChloraPrep  Timeout executed completed and confirmed site. RIGHT WRIST  /  HAND   10 cc of 1% plain Marcaine was injected into the proposed incision site. After several minutes A straight incision was made over the RIGHT carpal tunnel in line with the radial border of the ring finger. Blunt dissection was carried out to find the  distal aspect of the carpal tunnel. A blunted judgment was passed beneath the carpal tunnel. Sharp incision was then used to release the transverse carpal ligament. The contents of the carpal tunnel were inspected. The median nerve was noted to have good color and normal shape the carpal tunnel itself seem to be tight  The wound was irrigated and then closed with 3-0 nylon suture. (Proximal 2 interrupted sutures and then 1 running nylon suture.)  A sterile bandage was applied and the tourniquet was released the color of the hand and capillary refill were normal  The patient was taken to the recovery room in stable condition  PLAN OF CARE: Discharge to home after PACU  PATIENT DISPOSITION:  PACU - hemodynamically stable.   Delay start of Pharmacological VTE agent (>24hrs) due to surgical blood loss or risk of bleeding: not applicable

## 2019-11-28 NOTE — Interval H&P Note (Signed)
History and Physical Interval Note:  11/28/2019 9:51 AM  Tina Ross  has presented today for surgery, with the diagnosis of right carpal tunnel syndrome.  The various methods of treatment have been discussed with the patient and family. After consideration of risks, benefits and other options for treatment, the patient has consented to  Procedure(s): RIGHT CARPAL TUNNEL RELEASE (Right) as a surgical intervention.  The patient's history has been reviewed, patient examined, no change in status, stable for surgery.  I have reviewed the patient's chart and labs.  Questions were answered to the patient's satisfaction.     Tuesday Terlecki   

## 2019-11-28 NOTE — Anesthesia Postprocedure Evaluation (Signed)
Anesthesia Post Note  Patient: Tina Ross  Procedure(s) Performed: RIGHT CARPAL TUNNEL RELEASE (Right )  Patient location during evaluation: PACU Anesthesia Type: General Level of consciousness: awake, oriented, awake and alert and patient cooperative Pain management: pain level controlled Vital Signs Assessment: post-procedure vital signs reviewed and stable Respiratory status: spontaneous breathing, respiratory function stable and nonlabored ventilation Cardiovascular status: stable Postop Assessment: no apparent nausea or vomiting Anesthetic complications: no     Last Vitals:  Vitals:   11/28/19 0846  BP: 107/71  Pulse: 80  Resp: 18  Temp: 36.7 C  SpO2: 96%    Last Pain:  Vitals:   11/28/19 0846  TempSrc: Oral  PainSc: 7                  Lashaunda Schild

## 2019-11-28 NOTE — Brief Op Note (Signed)
11/28/2019  10:37 AM  PATIENT:  Tina Ross  39 y.o. female  PRE-OPERATIVE DIAGNOSIS:  right carpal tunnel syndrome  POST-OPERATIVE DIAGNOSIS:  right carpal tunnel syndrome  PROCEDURE:  Procedure(s): RIGHT CARPAL TUNNEL RELEASE (Right)   64721  SURGEON:  Surgeon(s) and Role:    * Shelbey Spindler E, MD - Primary  PHYSICIAN ASSISTANT:   ASSISTANTS: none   ANESTHESIA:   IV sedation with propofol and local 1% lidocaine plain 10 cc at the incision  EBL:  None    BLOOD ADMINISTERED:none  DRAINS: none   LOCAL MEDICATIONS USED:  OTHER as above  SPECIMEN:  No Specimen  DISPOSITION OF SPECIMEN:  N/A  COUNTS:  YES  TOURNIQUET:   Total Tourniquet Time Documented: Upper Arm (Right) - 12 minutes Total: Upper Arm (Right) - 12 minutes   DICTATION: .Dragon Dictation  PLAN OF CARE: Discharge to home after PACU  PATIENT DISPOSITION:  PACU - hemodynamically stable.   Delay start of Pharmacological VTE agent (>24hrs) due to surgical blood loss or risk of bleeding: not applicable   PROCEDURE:  Procedure(s): RIGHT CARPAL TUNNEL RELEASE RIGHT   DICTATION: .Dragon Dictation   Surgeon Teresia Myint  Operative findings compression of the RIGHT MEDIAN NERVE, color normal shape normal  Indications failure of conservative treatment to relieve pain and paresthesias and numbness and tingling of the RIGHT HAND   The patient was identified in the preop area we confirm the surgical site marked as right wrist. Chart update completed. Patient taken to surgery.  Antibiotic 2 g Ancef after establishing a successful IV sedation the right arm was prepped with ChloraPrep  Timeout executed completed and confirmed site. RIGHT WRIST  /  HAND   10 cc of 1% plain Marcaine was injected into the proposed incision site. After several minutes A straight incision was made over the RIGHT carpal tunnel in line with the radial border of the ring finger. Blunt dissection was carried out to find the  distal aspect of the carpal tunnel. A blunted judgment was passed beneath the carpal tunnel. Sharp incision was then used to release the transverse carpal ligament. The contents of the carpal tunnel were inspected. The median nerve was noted to have good color and normal shape the carpal tunnel itself seem to be tight  The wound was irrigated and then closed with 3-0 nylon suture. (Proximal 2 interrupted sutures and then 1 running nylon suture.)  A sterile bandage was applied and the tourniquet was released the color of the hand and capillary refill were normal  The patient was taken to the recovery room in stable condition  PLAN OF CARE: Discharge to home after PACU  PATIENT DISPOSITION:  PACU - hemodynamically stable.   Delay start of Pharmacological VTE agent (>24hrs) due to surgical blood loss or risk of bleeding: not applicable  

## 2019-11-28 NOTE — Transfer of Care (Signed)
Immediate Anesthesia Transfer of Care Note  Patient: Tina Ross  Procedure(s) Performed: RIGHT CARPAL TUNNEL RELEASE (Right )  Patient Location: PACU  Anesthesia Type:General  Level of Consciousness: awake, alert , oriented and patient cooperative  Airway & Oxygen Therapy: Patient Spontanous Breathing  Post-op Assessment: Report given to RN and Post -op Vital signs reviewed and stable  Post vital signs: Reviewed and stable  Last Vitals:  Vitals Value Taken Time  BP    Temp    Pulse 117 11/28/19 1031  Resp 18 11/28/19 1031  SpO2 77 % 11/28/19 1031  Vitals shown include unvalidated device data.  Last Pain:  Vitals:   11/28/19 0846  TempSrc: Oral  PainSc: 7       Patients Stated Pain Goal: 5 (11/28/19 0846)  Complications: No apparent anesthesia complications

## 2019-12-06 ENCOUNTER — Other Ambulatory Visit: Payer: Self-pay | Admitting: Orthopedic Surgery

## 2019-12-06 NOTE — Telephone Encounter (Signed)
Appointment 2 weeks post op for suture removal To Dr Romeo Apple for refill request

## 2019-12-06 NOTE — Telephone Encounter (Signed)
Patient called stating that she had CT surgery last Tuesday.  She said her wrist is burning and she only has a few pain pills left.   She also wanted to know when she was to come back in to see Dr. Romeo Apple for post op visit.  I told her that I would have to get with Dr. Romeo Apple on this.    Refill on Hydrocodone/Aetaminophen 7.5-325  Mgs.  Qty  42      Sig: Take 1 tablet by mouth every 4 (four) hours as needed for up to 7 days for moderate pain.   Patient uses [facility]

## 2019-12-06 NOTE — Telephone Encounter (Signed)
Rx request deferred to you.

## 2019-12-07 NOTE — Telephone Encounter (Signed)
Prescription denied

## 2019-12-12 ENCOUNTER — Ambulatory Visit (INDEPENDENT_AMBULATORY_CARE_PROVIDER_SITE_OTHER): Payer: Medicaid Other | Admitting: Orthopedic Surgery

## 2019-12-12 ENCOUNTER — Other Ambulatory Visit: Payer: Self-pay

## 2019-12-12 DIAGNOSIS — G8929 Other chronic pain: Secondary | ICD-10-CM

## 2019-12-12 DIAGNOSIS — Z9889 Other specified postprocedural states: Secondary | ICD-10-CM

## 2019-12-12 DIAGNOSIS — M25562 Pain in left knee: Secondary | ICD-10-CM | POA: Insufficient documentation

## 2019-12-12 NOTE — Progress Notes (Signed)
Chief Complaint  Patient presents with   Follow-up    Recheck on right hand, DOS 11-28-19.    39 years old status post right carpal tunnel release complains of incisional pain but says the numbness and tingling have resolved  Sutures were removed patient will come back in 4 weeks  Encounter Diagnoses  Name Primary?   Chronic pain of left knee Yes   S/P carpal tunnel release right/ 11/28/19

## 2019-12-12 NOTE — Progress Notes (Signed)
Patient states Dr Romeo Apple told her she can get a replacement for her lost left knee brace. I have provided her a replacement economy knee brace and advised her Medicaid may not cover the brace, and she likely will get a bill. She states that is fine, and signed for it and is aware may not be a covered item.

## 2020-01-10 ENCOUNTER — Ambulatory Visit: Payer: Medicaid Other | Admitting: Orthopedic Surgery

## 2020-01-18 ENCOUNTER — Ambulatory Visit: Payer: Medicaid Other | Admitting: Orthopedic Surgery

## 2020-02-16 ENCOUNTER — Ambulatory Visit: Payer: Medicaid Other | Admitting: Orthopedic Surgery

## 2020-08-19 ENCOUNTER — Ambulatory Visit: Payer: Medicaid Other | Admitting: Advanced Practice Midwife

## 2020-08-21 ENCOUNTER — Other Ambulatory Visit: Payer: Medicaid Other | Admitting: Advanced Practice Midwife

## 2021-02-05 ENCOUNTER — Telehealth: Payer: Self-pay | Admitting: Orthopedic Surgery

## 2021-02-05 NOTE — Telephone Encounter (Signed)
Patient called this afternoon, 02/05/21; relays she had an accident on 01/30/21 - said she fell, and that she went to [facility] Emergency room. States she was told she has a fractured ankle. States she did call an orthopaedics office in [place] on Friday, 01/31/21, and was told she cannot be seen there due to her [facility].  I relayed that our office does not have an immediate appointment, however, we can offer next available, and relayed protocol of emergency room provider notes, Xray reports, and Xray CD/films being needed.  Patient voiced understanding, and will call [facility] hospital to request.

## 2021-04-01 ENCOUNTER — Other Ambulatory Visit: Payer: Medicaid Other

## 2021-04-08 ENCOUNTER — Other Ambulatory Visit: Payer: Medicaid Other

## 2021-04-15 ENCOUNTER — Other Ambulatory Visit: Payer: Medicaid Other

## 2021-04-28 ENCOUNTER — Other Ambulatory Visit: Payer: Medicaid Other

## 2022-09-10 ENCOUNTER — Encounter: Payer: Self-pay | Admitting: Radiology
